# Patient Record
Sex: Female | Born: 1965
Health system: Southern US, Community
[De-identification: ages and names within clinical notes are randomized; demographics above are authoritative.]

## PROBLEM LIST (undated history)

## (undated) DIAGNOSIS — M1711 Unilateral primary osteoarthritis, right knee: Secondary | ICD-10-CM

## (undated) DIAGNOSIS — S83241A Other tear of medial meniscus, current injury, right knee, initial encounter: Secondary | ICD-10-CM

## (undated) DIAGNOSIS — K219 Gastro-esophageal reflux disease without esophagitis: Secondary | ICD-10-CM

## (undated) DIAGNOSIS — C44320 Squamous cell carcinoma of skin of unspecified parts of face: Secondary | ICD-10-CM

## (undated) DIAGNOSIS — D239 Other benign neoplasm of skin, unspecified: Secondary | ICD-10-CM

## (undated) DIAGNOSIS — I10 Essential (primary) hypertension: Secondary | ICD-10-CM

## (undated) DIAGNOSIS — K635 Polyp of colon: Secondary | ICD-10-CM

## (undated) DIAGNOSIS — J45990 Exercise induced bronchospasm: Secondary | ICD-10-CM

## (undated) DIAGNOSIS — M1990 Unspecified osteoarthritis, unspecified site: Secondary | ICD-10-CM

## (undated) HISTORY — DX: Essential (primary) hypertension: I10

## (undated) HISTORY — DX: Gastro-esophageal reflux disease without esophagitis: K21.9

## (undated) HISTORY — DX: Exercise induced bronchospasm: J45.990

## (undated) HISTORY — DX: Other benign neoplasm of skin, unspecified: D23.9

## (undated) HISTORY — DX: Polyp of colon: K63.5

## (undated) HISTORY — DX: Squamous cell carcinoma of skin of unspecified parts of face: C44.320

---

## 2003-09-06 ENCOUNTER — Emergency Department (HOSPITAL_COMMUNITY): Admission: EM | Admit: 2003-09-06 | Discharge: 2003-09-06 | Payer: Self-pay | Admitting: Emergency Medicine

## 2005-01-27 HISTORY — PX: ABDOMINAL HYSTERECTOMY: SHX81

## 2008-02-22 LAB — IFOBT (OCCULT BLOOD): IFOBT: NEGATIVE

## 2009-08-21 LAB — IFOBT (OCCULT BLOOD): IFOBT: NEGATIVE

## 2010-12-02 ENCOUNTER — Ambulatory Visit (INDEPENDENT_AMBULATORY_CARE_PROVIDER_SITE_OTHER): Payer: 59 | Admitting: Family Medicine

## 2010-12-02 ENCOUNTER — Encounter: Payer: Self-pay | Admitting: Family Medicine

## 2010-12-02 VITALS — BP 128/86 | HR 75 | Temp 98.1°F | Ht 63.0 in | Wt 198.0 lb

## 2010-12-02 DIAGNOSIS — M25522 Pain in left elbow: Secondary | ICD-10-CM

## 2010-12-02 DIAGNOSIS — M25529 Pain in unspecified elbow: Secondary | ICD-10-CM

## 2010-12-02 MED ORDER — MELOXICAM 15 MG PO TABS: 15.0000 mg | ORAL_TABLET | Freq: Every day | ORAL | Status: AC

## 2010-12-02 NOTE — Assessment & Plan Note (Signed)
2/2 lateral epicondylitis.  Start physical therapy and transition to HEP.  Counterforce brace felt comfortable so will start this.  Meloxicam daily for pain and inflammation with food.  Offered tramadol for nighttime but she declined.  Icing prn.  Should improve over next 6 weeks.  F/u at that time for reevaluation.  Consider nitro patches, injection if not improving.  See instructions for further.

## 2010-12-02 NOTE — Patient Instructions (Signed)
You have lateral epicondylitis Try to avoid painful activities as much as possible. Ice the area 3-4 times a day for 15 minutes at a time. Meloxicam 15mg  daily with food for pain and inflammation x 7 days then as needed. Counterforce brace or elbow sleeve as directed can help unload area - wear this regularly if it provides you with relief. Home Pronation/supination with 1 pound weight, wrist extension, stretching - do these once a day. Start physical therapy and transition to a home exercise program. Consider injection for short term pain relief if the above is not helping. Nitro patches are a consideration if not improving after 6 weeks. Surgery is a consideration if exercises, nitro, and injection are not providing enough relief. Follow up with me in 6 weeks for a recheck.

## 2010-12-02 NOTE — Progress Notes (Signed)
  Subjective:    Patient ID: Stephanie Castro, female    DOB: 1965-12-02, 45 y.o.   MRN: [id number]  PCP: Dr. Riley Nearing  HPI 45 yo F here for left lateral elbow pain.  Patient denies known injury. States about 3 months ago started to develop lateral elbow pain worse with picking things up. + night pain. Occasional numbness but nothing consistent. Went to [facility] and given 6 day course of prednisone followed by celebrex. States pain resolved with prednisone but started coming back on celebrex. Is left handed. No prior left elbow problems. No neck pain or radiation into shoulder.  Past Medical History  Diagnosis Date  . Hypertension   . GERD (gastroesophageal reflux disease)     No current outpatient prescriptions on file prior to visit.    History reviewed. No pertinent past surgical history.  Allergies  Allergen Reactions  . Penicillins     History   Social History  . Marital Status: Married    Spouse Name: N/A    Number of Children: N/A  . Years of Education: N/A   Occupational History  . Not on file.   Social History Main Topics  . Smoking status: Former Games developer  . Smokeless tobacco: Not on file   Comment: Quit in 2005  . Alcohol Use: Not on file  . Drug Use: Not on file  . Sexually Active: Not on file   Other Topics Concern  . Not on file   Social History Narrative  . No narrative on file    Family History  Problem Relation Age of Onset  . Hyperlipidemia Mother   . Heart attack Father   . Heart attack Sister   . Hyperlipidemia Sister   . Hypertension Sister   . Hyperlipidemia Maternal Aunt   . Hypertension Maternal Uncle   . Diabetes Maternal Uncle   . Hyperlipidemia Maternal Grandmother   . Diabetes Maternal Grandmother   . Diabetes Maternal Grandfather   . Sudden death Neg Hx     BP 128/86  Pulse 75  Temp(Src) 98.1 F (36.7 C) (Oral)  Ht 5\' 3"  (1.6 m)  Wt 198 lb (89.812 kg)  BMI 35.07 kg/m2  Review of  Systems See HPI above.    Objective:   Physical Exam Gen: NAD  L elbow: No gross deformity, swelling, or bruising. TTP lateral epicondyle and in extensor mass reproducing her pain.  No medial epicondyle, humeral, radial head, olecranon, or other TTP about wrist, forearm, elbow, shoulder, or neck. FROM with pain on resisted wrist extension at lateral elbow.  5/5 strength and no pain all other wrist and elbow motions.  No pain on 3rd finger extension. Collateral ligaments intact. NVI distally. Tinels at cubital tunnel negative.     Assessment & Plan:  1. Left elbow pain - 2/2 lateral epicondylitis.  Start physical therapy and transition to HEP.  Counterforce brace felt comfortable so will start this.  Meloxicam daily for pain and inflammation with food.  Offered tramadol for nighttime but she declined.  Icing prn.  Should improve over next 6 weeks.  F/u at that time for reevaluation.  Consider nitro patches, injection if not improving.  See instructions for further.

## 2010-12-03 ENCOUNTER — Ambulatory Visit: Payer: 59 | Attending: Family Medicine | Admitting: Physical Therapy

## 2010-12-03 DIAGNOSIS — M79609 Pain in unspecified limb: Secondary | ICD-10-CM | POA: Insufficient documentation

## 2010-12-03 DIAGNOSIS — IMO0001 Reserved for inherently not codable concepts without codable children: Secondary | ICD-10-CM | POA: Insufficient documentation

## 2010-12-05 ENCOUNTER — Ambulatory Visit: Payer: 59 | Admitting: Physical Therapy

## 2010-12-09 ENCOUNTER — Ambulatory Visit: Payer: 59 | Admitting: Physical Therapy

## 2010-12-11 ENCOUNTER — Ambulatory Visit: Payer: 59 | Admitting: Physical Therapy

## 2010-12-13 ENCOUNTER — Ambulatory Visit: Payer: 59 | Admitting: Physical Therapy

## 2010-12-16 ENCOUNTER — Ambulatory Visit: Payer: 59 | Admitting: Physical Therapy

## 2010-12-18 ENCOUNTER — Encounter: Payer: 59 | Admitting: Physical Therapy

## 2010-12-23 ENCOUNTER — Encounter: Payer: 59 | Admitting: Physical Therapy

## 2010-12-25 ENCOUNTER — Ambulatory Visit: Payer: 59 | Admitting: Physical Therapy

## 2010-12-31 ENCOUNTER — Ambulatory Visit: Payer: 59 | Admitting: Physical Therapy

## 2011-01-03 ENCOUNTER — Ambulatory Visit: Payer: 59 | Admitting: Physical Therapy

## 2011-01-07 ENCOUNTER — Ambulatory Visit: Payer: 59 | Attending: Family Medicine | Admitting: Physical Therapy

## 2011-01-07 DIAGNOSIS — M79609 Pain in unspecified limb: Secondary | ICD-10-CM | POA: Insufficient documentation

## 2011-01-07 DIAGNOSIS — IMO0001 Reserved for inherently not codable concepts without codable children: Secondary | ICD-10-CM | POA: Insufficient documentation

## 2011-01-10 ENCOUNTER — Ambulatory Visit: Payer: 59 | Admitting: Family Medicine

## 2011-04-04 ENCOUNTER — Telehealth: Payer: Self-pay | Admitting: Internal Medicine

## 2011-04-04 ENCOUNTER — Encounter: Payer: Self-pay | Admitting: Internal Medicine

## 2011-04-04 ENCOUNTER — Ambulatory Visit (INDEPENDENT_AMBULATORY_CARE_PROVIDER_SITE_OTHER): Payer: BC Managed Care – PPO | Admitting: Internal Medicine

## 2011-04-04 VITALS — BP 116/80 | HR 66 | Temp 98.4°F | Resp 18 | Ht 62.0 in | Wt 195.0 lb

## 2011-04-04 DIAGNOSIS — I10 Essential (primary) hypertension: Secondary | ICD-10-CM

## 2011-04-04 DIAGNOSIS — E785 Hyperlipidemia, unspecified: Secondary | ICD-10-CM

## 2011-04-04 DIAGNOSIS — K219 Gastro-esophageal reflux disease without esophagitis: Secondary | ICD-10-CM

## 2011-04-04 DIAGNOSIS — Z1239 Encounter for other screening for malignant neoplasm of breast: Secondary | ICD-10-CM

## 2011-04-04 DIAGNOSIS — Z79899 Other long term (current) drug therapy: Secondary | ICD-10-CM

## 2011-04-04 MED ORDER — OMEPRAZOLE 40 MG PO CPDR: 40.0000 mg | DELAYED_RELEASE_CAPSULE | Freq: Every day | ORAL | Status: DC

## 2011-04-04 MED ORDER — TRIAMTERENE-HCTZ 37.5-25 MG PO CAPS: 1.0000 | ORAL_CAPSULE | Freq: Every day | ORAL | Status: DC

## 2011-04-04 NOTE — Patient Instructions (Addendum)
Please schedule fasting lab work prior to next visit Cbc-401.9, chem7-v58.69 and lipid/lft-272.4 Also we will help you schedule your mammogram at [facility] ([place])

## 2011-04-04 NOTE — Telephone Encounter (Signed)
Lab orders entered for July 2013. 

## 2011-04-04 NOTE — Progress Notes (Signed)
  Subjective:    Patient ID: Stephanie Castro, female    DOB: 1965/10/11, 46 y.o.   MRN: [id number]  HPI Pt presents to clinic for evaluation of multiple medical problems. H/o hyperlipidemia of unknown severity and is attempting to avoid statin tx. H/o GERD well controlled with daily ppi without dysphagia. BP reviewed normotensive and is compliant with medication without adverse effect. Last mammogram 2011. No active complaint.  Past Medical History  Diagnosis Date  . Hypertension   . GERD (gastroesophageal reflux disease)    No past surgical history on file.  reports that she has quit smoking. She does not have any smokeless tobacco history on file. Her alcohol and drug histories not on file. family history includes Diabetes in her maternal grandfather, maternal grandmother, and maternal uncle; Heart attack in her father and sister; Hyperlipidemia in her maternal aunt, maternal grandmother, mother, and sister; and Hypertension in her maternal uncle and sister.  There is no history of Sudden death. Allergies  Allergen Reactions  . Penicillins      Review of Systems  Respiratory: Negative for cough and shortness of breath.   Cardiovascular: Negative for chest pain.  Gastrointestinal: Negative for abdominal pain.  All other systems reviewed and are negative.       Objective:   Physical Exam  Physical Exam  Nursing note and vitals reviewed. Constitutional: Appears well-developed and well-nourished. No distress.  HENT:  Head: Normocephalic and atraumatic.  Right Ear: External ear normal.  Left Ear: External ear normal.  Eyes: Conjunctivae are normal. No scleral icterus.  Neck: Neck supple. Carotid bruit is not present.  Cardiovascular: Normal rate, regular rhythm and normal heart sounds.  Exam reveals no gallop and no friction rub.   No murmur heard. Pulmonary/Chest: Effort normal and breath sounds normal. No respiratory distress. He has no wheezes. no rales.  Lymphadenopathy:   He has no cervical adenopathy.  Neurological:Alert.  Skin: Skin is warm and dry. Not diaphoretic.  Psychiatric: Has a normal mood and affect.        Assessment & Plan:

## 2011-04-06 DIAGNOSIS — K219 Gastro-esophageal reflux disease without esophagitis: Secondary | ICD-10-CM | POA: Insufficient documentation

## 2011-04-06 DIAGNOSIS — E785 Hyperlipidemia, unspecified: Secondary | ICD-10-CM

## 2011-04-06 HISTORY — DX: Hyperlipidemia, unspecified: E78.5

## 2011-04-06 NOTE — Assessment & Plan Note (Signed)
asx and well controlled. rf ppi.

## 2011-04-06 NOTE — Assessment & Plan Note (Signed)
Normotensive and stable. Continue current regimen. Monitor bp as outpt and followup in clinic as scheduled. Obtain cbc, chem7 prior to next visit 

## 2011-04-06 NOTE — Assessment & Plan Note (Signed)
Stable. Obtain lipid/lft prior to next visit. 

## 2011-04-29 ENCOUNTER — Encounter: Payer: Self-pay | Admitting: Internal Medicine

## 2011-05-08 ENCOUNTER — Telehealth: Payer: Self-pay | Admitting: Internal Medicine

## 2011-05-08 NOTE — Telephone Encounter (Signed)
Received medical records from [facility]. Martin Majestic   P: [phone] F: [phone]

## 2011-06-13 ENCOUNTER — Ambulatory Visit (INDEPENDENT_AMBULATORY_CARE_PROVIDER_SITE_OTHER): Payer: BC Managed Care – PPO | Admitting: Family Medicine

## 2011-06-13 ENCOUNTER — Encounter: Payer: Self-pay | Admitting: Family Medicine

## 2011-06-13 VITALS — BP 130/88 | HR 63 | Temp 97.7°F | Ht 62.0 in | Wt 193.0 lb

## 2011-06-13 DIAGNOSIS — M25521 Pain in right elbow: Secondary | ICD-10-CM | POA: Insufficient documentation

## 2011-06-13 DIAGNOSIS — M25529 Pain in unspecified elbow: Secondary | ICD-10-CM

## 2011-06-13 NOTE — Patient Instructions (Signed)
You have lateral epicondylitis (tennis elbow). Try to avoid painful activities as much as possible except with rehab. Ice the area 3-4 times a day for 15 minutes at a time. Tylenol and/or meloxicam as needed for pain. Counterforce brace as directed can help unload area - wear this regularly if it provides you with relief. Home Pronation/supination with hammer, wrist extension with 1 pound weight (3 sets of 10 of each), stretching - do these once a day. Consider injection for short term pain relief if the above is not helping - you were given this today. Consider nitro patches, formal PT if not improving as expected. Call me if after 1-2 weeks you're not getting better and you want to try the nitro patches. Otherwise follow-up with me as needed.

## 2011-06-13 NOTE — Progress Notes (Signed)
Subjective:    Patient ID: Stephanie Castro, female    DOB: 11/29/1965, 46 y.o.   MRN: [id number]  PCP: Dr. Riley Nearing  HPI  46 yo F here for right lateral elbow pain.  12/02/10: Patient denies known injury. States about 3 months ago started to develop lateral elbow pain worse with picking things up. + night pain. Occasional numbness but nothing consistent. Went to [facility] and given 6 day course of prednisone followed by celebrex. States pain resolved with prednisone but started coming back on celebrex. Is left handed. No prior left elbow problems. No neck pain or radiation into shoulder.  06/13/11: Patient's left elbow has improved. Reports right elbow has worsened in same location over past 2 weeks. Difficulty bending elbow. Has been using heat then ice, massaging, taking leftover meloxicam. Using a counterforce sleeve. No prior right elbow problems.  Past Medical History  Diagnosis Date  . Hypertension   . GERD (gastroesophageal reflux disease)     Current Outpatient Prescriptions on File Prior to Visit  Medication Sig Dispense Refill  . meclizine (ANTIVERT) 12.5 MG tablet Take 12.5 mg by mouth 3 (three) times daily as needed.        . meloxicam (MOBIC) 15 MG tablet Take 1 tablet (15 mg total) by mouth daily. With food.  30 tablet  1  . omeprazole (PRILOSEC) 40 MG capsule Take 1 capsule (40 mg total) by mouth daily.  30 capsule  11  . triamterene-hydrochlorothiazide (DYAZIDE) 37.5-25 MG per capsule Take 1 each (1 capsule total) by mouth daily.  30 capsule  11    History reviewed. No pertinent past surgical history.  Allergies  Allergen Reactions  . Penicillins     History   Social History  . Marital Status: Married    Spouse Name: N/A    Number of Children: N/A  . Years of Education: N/A   Occupational History  . Not on file.   Social History Main Topics  . Smoking status: Former Games developer  . Smokeless tobacco: Never Used   Comment: Quit  in 2005  . Alcohol Use: Yes  . Drug Use: No  . Sexually Active: Not on file   Other Topics Concern  . Not on file   Social History Narrative  . No narrative on file    Family History  Problem Relation Age of Onset  . Hyperlipidemia Mother   . Heart attack Father   . Heart attack Sister   . Hyperlipidemia Sister   . Hypertension Sister   . Hyperlipidemia Maternal Aunt   . Hypertension Maternal Uncle   . Diabetes Maternal Uncle   . Hyperlipidemia Maternal Grandmother   . Diabetes Maternal Grandmother   . Diabetes Maternal Grandfather   . Sudden death Neg Hx     BP 130/88  Pulse 63  Temp(Src) 97.7 F (36.5 C) (Oral)  Ht 5\' 2"  (1.575 m)  Wt 193 lb (87.544 kg)  BMI 35.30 kg/m2  Review of Systems  See HPI above.    Objective:   Physical Exam  Gen: NAD  R elbow: No gross deformity, swelling, or bruising. TTP lateral epicondyle and in extensor mass reproducing her pain.  No medial epicondyle, humeral, radial head, olecranon, or other TTP about wrist, forearm, elbow, shoulder, or neck. FROM with pain on resisted wrist extension at lateral elbow and 3rd finger extension.  5/5 strength and no pain all other wrist and elbow motions. Collateral ligaments intact. NVI distally.     Assessment &  Plan:  1. Right elbow pain - 2/2 lateral epicondylitis.  Start home exercises, stretches.  Icing, meloxicam and tylenol as needed.  She would like to go ahead with injection today.  If not improving will consider nitro patches.    After informed written consent patient was seated on exam table.  Area overlying area just distal to right lateral epicondyle prepped with alcohol swab then injected with 2:1 marcaine: depomedrol.  Patient tolerated procedure well without immediate complications.

## 2011-06-13 NOTE — Assessment & Plan Note (Signed)
2/2 lateral epicondylitis.  Start home exercises, stretches.  Icing, meloxicam and tylenol as needed.  She would like to go ahead with injection today.  If not improving will consider nitro patches.    After informed written consent patient was seated on exam table.  Area overlying area just distal to right lateral epicondyle prepped with alcohol swab then injected with 2:1 marcaine: depomedrol.  Patient tolerated procedure well without immediate complications.

## 2011-08-07 ENCOUNTER — Ambulatory Visit (INDEPENDENT_AMBULATORY_CARE_PROVIDER_SITE_OTHER): Payer: BC Managed Care – PPO | Admitting: Family Medicine

## 2011-08-07 VITALS — BP 124/86 | HR 71 | Temp 98.0°F | Ht 62.0 in | Wt 185.0 lb

## 2011-08-07 DIAGNOSIS — M25529 Pain in unspecified elbow: Secondary | ICD-10-CM

## 2011-08-07 DIAGNOSIS — M25521 Pain in right elbow: Secondary | ICD-10-CM

## 2011-08-07 NOTE — Patient Instructions (Addendum)
Continue with your home exercises after a few days (wrist extension, stretching, hammer exercise). Ice area 15 minutes at a time 3-4 times a day for 15 minutes at a time. Counterforce brace as directed can help unload area - wear this regularly if it provides you with relief. I would not give you a third injection for this. If you're not improving after a few weeks, call me and we will do nitro patches and physical therapy.

## 2011-08-08 ENCOUNTER — Encounter: Payer: Self-pay | Admitting: Family Medicine

## 2011-08-08 ENCOUNTER — Ambulatory Visit: Payer: BC Managed Care – PPO | Admitting: Internal Medicine

## 2011-08-08 NOTE — Progress Notes (Signed)
Subjective:    Patient ID: Stephanie Castro, female    DOB: 1965/11/20, 46 y.o.   MRN: [id number]  PCP: Dr. Riley Nearing  HPI  46 yo F here for right lateral elbow pain.  12/02/10: Patient denies known injury. States about 3 months ago started to develop lateral elbow pain worse with picking things up. + night pain. Occasional numbness but nothing consistent. Went to [facility] and given 6 day course of prednisone followed by celebrex. States pain resolved with prednisone but started coming back on celebrex. Is left handed. No prior left elbow problems. No neck pain or radiation into shoulder.  06/13/11: Patient's left elbow has improved. Reports right elbow has worsened in same location over past 2 weeks. Difficulty bending elbow. Has been using heat then ice, massaging, taking leftover meloxicam. Using a counterforce sleeve. No prior right elbow problems.  7/11: Patient states injection helped take pain away for a month but has recurred over past 2-3 weeks. Has a burning pain outside right elbow - left elbow no longer in pain. Using brace and doing home exercises. Ached with icing - doing this a lot though. No longer taking mobic or an nsaid.  Past Medical History  Diagnosis Date  . Hypertension   . GERD (gastroesophageal reflux disease)     Current Outpatient Prescriptions on File Prior to Visit  Medication Sig Dispense Refill  . meclizine (ANTIVERT) 12.5 MG tablet Take 12.5 mg by mouth 3 (three) times daily as needed.        . meloxicam (MOBIC) 15 MG tablet Take 1 tablet (15 mg total) by mouth daily. With food.  30 tablet  1  . omeprazole (PRILOSEC) 40 MG capsule Take 1 capsule (40 mg total) by mouth daily.  30 capsule  11  . triamterene-hydrochlorothiazide (DYAZIDE) 37.5-25 MG per capsule Take 1 each (1 capsule total) by mouth daily.  30 capsule  11    History reviewed. No pertinent past surgical history.  Allergies  Allergen Reactions  .  Penicillins     History   Social History  . Marital Status: Married    Spouse Name: N/A    Number of Children: N/A  . Years of Education: N/A   Occupational History  . Not on file.   Social History Main Topics  . Smoking status: Former Games developer  . Smokeless tobacco: Never Used   Comment: Quit in 2005  . Alcohol Use: Yes  . Drug Use: No  . Sexually Active: Not on file   Other Topics Concern  . Not on file   Social History Narrative  . No narrative on file    Family History  Problem Relation Age of Onset  . Hyperlipidemia Mother   . Heart attack Father   . Heart attack Sister   . Hyperlipidemia Sister   . Hypertension Sister   . Hyperlipidemia Maternal Aunt   . Hypertension Maternal Uncle   . Diabetes Maternal Uncle   . Hyperlipidemia Maternal Grandmother   . Diabetes Maternal Grandmother   . Diabetes Maternal Grandfather   . Sudden death Neg Hx     BP 124/86  Pulse 71  Temp 98 F (36.7 C) (Oral)  Ht 5\' 2"  (1.575 m)  Wt 185 lb (83.915 kg)  BMI 33.84 kg/m2  Review of Systems  See HPI above.    Objective:   Physical Exam  Gen: NAD  R elbow: No gross deformity, swelling, or bruising. TTP lateral epicondyle and in extensor mass reproducing her  pain.  No medial epicondyle, humeral, radial head, olecranon, or other TTP about wrist, forearm, elbow, shoulder, or neck. FROM with pain on resisted wrist extension at lateral elbow and 3rd finger extension.  5/5 strength and no pain all other wrist and elbow motions. Collateral ligaments intact. NVI distally.     Assessment & Plan:  1. Right elbow pain - 2/2 lateral epicondylitis.  Exam similar to last office visit.  Reviewed options: PT, nitro, and/or injection.  She would like to try injection again - would not do a third time however.  If this recurs advised to call at which point would order PT and try nitro patches.  Other treatment as discussed previously.   After informed written consent patient was  seated on exam table.  Area overlying area just distal to right lateral epicondyle prepped with alcohol swab then injected with 2:1 marcaine: depomedrol.  Patient tolerated procedure well without immediate complications.

## 2011-08-08 NOTE — Assessment & Plan Note (Signed)
2/2 lateral epicondylitis.  Exam similar to last office visit.  Reviewed options: PT, nitro, and/or injection.  She would like to try injection again - would not do a third time however.  If this recurs advised to call at which point would order PT and try nitro patches.  Other treatment as discussed previously.   After informed written consent patient was seated on exam table.  Area overlying area just distal to right lateral epicondyle prepped with alcohol swab then injected with 2:1 marcaine: depomedrol.  Patient tolerated procedure well without immediate complications.

## 2011-09-23 ENCOUNTER — Ambulatory Visit (INDEPENDENT_AMBULATORY_CARE_PROVIDER_SITE_OTHER): Payer: BC Managed Care – PPO | Admitting: Internal Medicine

## 2011-09-23 ENCOUNTER — Encounter: Payer: Self-pay | Admitting: Internal Medicine

## 2011-09-23 VITALS — BP 126/84 | HR 104 | Temp 98.8°F | Resp 18 | Wt 180.0 lb

## 2011-09-23 DIAGNOSIS — I10 Essential (primary) hypertension: Secondary | ICD-10-CM

## 2011-09-23 DIAGNOSIS — J40 Bronchitis, not specified as acute or chronic: Secondary | ICD-10-CM

## 2011-09-23 MED ORDER — DOXYCYCLINE HYCLATE 100 MG PO TABS: 100.0000 mg | ORAL_TABLET | Freq: Two times a day (BID) | ORAL | Status: AC

## 2011-09-23 NOTE — Assessment & Plan Note (Signed)
Begin abx tx. Followup if no improvement or worsening.  

## 2011-09-23 NOTE — Progress Notes (Signed)
  Subjective:    Patient ID: Stephanie Castro, female    DOB: 1965-03-03, 46 y.o.   MRN: [id number]  HPI Pt presents to clinic for evaluation of cough. Notes one week h/o cough productive for green sputum, nasal congestion/drainage and chest congestion. Denies hemoptysis or wheezing. Unsure about fever. Reviewed bp under average control despite illness.   Past Medical History  Diagnosis Date  . Hypertension   . GERD (gastroesophageal reflux disease)    No past surgical history on file.  reports that she has quit smoking. She has never used smokeless tobacco. She reports that she drinks alcohol. She reports that she does not use illicit drugs. family history includes Diabetes in her maternal grandfather, maternal grandmother, and maternal uncle; Heart attack in her father and sister; Hyperlipidemia in her maternal aunt, maternal grandmother, mother, and sister; and Hypertension in her maternal uncle and sister.  There is no history of Sudden death. Allergies  Allergen Reactions  . Penicillins      Review of Systems see hpi     Objective:   Physical Exam  Nursing note and vitals reviewed. Constitutional: She appears well-developed and well-nourished. No distress.  HENT:  Head: Normocephalic and atraumatic.  Right Ear: External ear normal.  Left Ear: External ear normal.  Nose: Nose normal.  Mouth/Throat: Oropharynx is clear and moist. No oropharyngeal exudate.  Eyes: Conjunctivae are normal. No scleral icterus.  Neck: Neck supple.  Pulmonary/Chest: Effort normal and breath sounds normal. No respiratory distress. She has no wheezes. She has no rales.  Lymphadenopathy:    She has no cervical adenopathy.  Neurological: She is alert.  Skin: Skin is warm and dry. She is not diaphoretic.  Psychiatric: She has a normal mood and affect.          Assessment & Plan:

## 2011-09-23 NOTE — Assessment & Plan Note (Signed)
Normotensive and stable. Continue current regimen. Monitor bp as outpt and followup in clinic as scheduled. Obtain cbc, chem7 and lipid

## 2011-12-05 ENCOUNTER — Ambulatory Visit (INDEPENDENT_AMBULATORY_CARE_PROVIDER_SITE_OTHER): Payer: BC Managed Care – PPO | Admitting: Family Medicine

## 2011-12-05 VITALS — BP 128/86 | Ht 61.5 in | Wt 180.0 lb

## 2011-12-05 DIAGNOSIS — M25521 Pain in right elbow: Secondary | ICD-10-CM

## 2011-12-05 DIAGNOSIS — M25529 Pain in unspecified elbow: Secondary | ICD-10-CM

## 2011-12-05 LAB — CBC
MCHC: 33.9 g/dL (ref 30.0–36.0)
RBC: 4.34 MIL/uL (ref 3.87–5.11)

## 2011-12-05 MED ORDER — NITROGLYCERIN 0.2 MG/HR TD PT24: MEDICATED_PATCH | TRANSDERMAL | Status: DC

## 2011-12-05 NOTE — Telephone Encounter (Signed)
Lab orders released/SLS 

## 2011-12-05 NOTE — Addendum Note (Signed)
Addended by: Regis Bill on: 12/05/2011 11:30 AM   Modules accepted: Orders

## 2011-12-06 LAB — BASIC METABOLIC PANEL
BUN: 9 mg/dL (ref 6–23)
Calcium: 8.9 mg/dL (ref 8.4–10.5)
Creat: 0.72 mg/dL (ref 0.50–1.10)
Potassium: 3.7 mEq/L (ref 3.5–5.3)
Sodium: 140 mEq/L (ref 135–145)

## 2011-12-06 LAB — HEPATIC FUNCTION PANEL
AST: 35 U/L (ref 0–37)
Alkaline Phosphatase: 67 U/L (ref 39–117)
Total Bilirubin: 0.6 mg/dL (ref 0.3–1.2)
Total Protein: 6.4 g/dL (ref 6.0–8.3)

## 2011-12-10 ENCOUNTER — Encounter: Payer: Self-pay | Admitting: Family Medicine

## 2011-12-10 NOTE — Progress Notes (Signed)
Subjective:    Patient ID: Stephanie Castro, female    DOB: 02-04-1965, 46 y.o.   MRN: [id number]  PCP: Dr. Riley Nearing  HPI  46 yo F here for right lateral elbow pain.  12/02/10: Patient denies known injury. States about 3 months ago started to develop lateral elbow pain worse with picking things up. + night pain. Occasional numbness but nothing consistent. Went to [facility] and given 6 day course of prednisone followed by celebrex. States pain resolved with prednisone but started coming back on celebrex. Is left handed. No prior left elbow problems. No neck pain or radiation into shoulder.  06/13/11: Patient's left elbow has improved. Reports right elbow has worsened in same location over past 2 weeks. Difficulty bending elbow. Has been using heat then ice, massaging, taking leftover meloxicam. Using a counterforce sleeve. No prior right elbow problems.  7/11: Patient states injection helped take pain away for a month but has recurred over past 2-3 weeks. Has a burning pain outside right elbow - left elbow no longer in pain. Using brace and doing home exercises. Ached with icing - doing this a lot though. No longer taking mobic or an nsaid.  11/8: Patient again had about 1 month relief with repeat injection. Continues with lateral elbow burning pain, stiffness. Difficulty gripping items. Using heating pad then cold. Difficult to get dressed. Doing home exercises regularly.  Past Medical History  Diagnosis Date  . Hypertension   . GERD (gastroesophageal reflux disease)     Current Outpatient Prescriptions on File Prior to Visit  Medication Sig Dispense Refill  . meclizine (ANTIVERT) 12.5 MG tablet Take 12.5 mg by mouth 3 (three) times daily as needed.        . nitroGLYCERIN (NITRODUR - DOSED IN MG/24 HR) 0.2 mg/hr Apply 1/4th of a patch over affected elbow and change daily  30 patch  1  . omeprazole (PRILOSEC) 40 MG capsule Take 1 capsule (40 mg  total) by mouth daily.  30 capsule  11  . triamterene-hydrochlorothiazide (DYAZIDE) 37.5-25 MG per capsule Take 1 each (1 capsule total) by mouth daily.  30 capsule  11    History reviewed. No pertinent past surgical history.  Allergies  Allergen Reactions  . Penicillins     History   Social History  . Marital Status: Married    Spouse Name: N/A    Number of Children: N/A  . Years of Education: N/A   Occupational History  . Not on file.   Social History Main Topics  . Smoking status: Former Games developer  . Smokeless tobacco: Never Used     Comment: Quit in 2005  . Alcohol Use: Yes  . Drug Use: No  . Sexually Active: Not on file   Other Topics Concern  . Not on file   Social History Narrative  . No narrative on file    Family History  Problem Relation Age of Onset  . Hyperlipidemia Mother   . Heart attack Father   . Heart attack Sister   . Hyperlipidemia Sister   . Hypertension Sister   . Hyperlipidemia Maternal Aunt   . Hypertension Maternal Uncle   . Diabetes Maternal Uncle   . Hyperlipidemia Maternal Grandmother   . Diabetes Maternal Grandmother   . Diabetes Maternal Grandfather   . Sudden death Neg Hx     BP 128/86  Ht 5' 1.5" (1.562 m)  Wt 180 lb (81.647 kg)  BMI 33.46 kg/m2  Review of Systems  See  HPI above.    Objective:   Physical Exam  Gen: NAD  R elbow: No gross deformity, swelling, or bruising. TTP lateral epicondyle and in extensor mass reproducing her pain.  No medial epicondyle, humeral, radial head, olecranon, or other TTP about wrist, forearm, elbow, shoulder, or neck. FROM with pain on resisted wrist extension at lateral elbow and 3rd finger extension.   5/5 strength and no pain all other wrist and elbow motions. Collateral ligaments intact. NVI distally.     Assessment & Plan:  1. Right elbow pain - 2/2 lateral epicondylitis.  Not improved after 2 injections and home exercise program.  Will start formal PT and nitro patches.  If  still not improving would consider surgical referral.  F/u in 6 weeks.

## 2011-12-10 NOTE — Assessment & Plan Note (Signed)
2/2 lateral epicondylitis.  Not improved after 2 injections and home exercise program.  Will start formal PT and nitro patches.  If still not improving would consider surgical referral.  F/u in 6 weeks.

## 2012-01-02 ENCOUNTER — Ambulatory Visit: Payer: 59 | Attending: Family Medicine | Admitting: Rehabilitation

## 2012-01-02 DIAGNOSIS — M25539 Pain in unspecified wrist: Secondary | ICD-10-CM | POA: Insufficient documentation

## 2012-01-02 DIAGNOSIS — IMO0001 Reserved for inherently not codable concepts without codable children: Secondary | ICD-10-CM | POA: Insufficient documentation

## 2012-01-06 ENCOUNTER — Ambulatory Visit: Payer: 59 | Admitting: Rehabilitation

## 2012-01-13 ENCOUNTER — Ambulatory Visit: Payer: 59

## 2012-01-26 ENCOUNTER — Ambulatory Visit: Payer: 59 | Admitting: Rehabilitation

## 2012-03-16 ENCOUNTER — Encounter: Payer: Self-pay | Admitting: Family Medicine

## 2012-03-16 ENCOUNTER — Ambulatory Visit (INDEPENDENT_AMBULATORY_CARE_PROVIDER_SITE_OTHER): Payer: Self-pay | Admitting: Family Medicine

## 2012-03-16 VITALS — BP 130/80 | HR 76 | Temp 97.7°F | Ht 62.0 in | Wt 190.2 lb

## 2012-03-16 DIAGNOSIS — N390 Urinary tract infection, site not specified: Secondary | ICD-10-CM

## 2012-03-16 DIAGNOSIS — R3915 Urgency of urination: Secondary | ICD-10-CM

## 2012-03-16 LAB — POCT URINALYSIS DIPSTICK
Bilirubin, UA: NEGATIVE
Glucose, UA: NEGATIVE
Ketones, UA: NEGATIVE
pH, UA: 7

## 2012-03-16 MED ORDER — NITROFURANTOIN MONOHYD MACRO 100 MG PO CAPS: 100.0000 mg | ORAL_CAPSULE | Freq: Two times a day (BID) | ORAL | Status: DC

## 2012-03-16 NOTE — Progress Notes (Signed)
  Subjective:    Patient ID: Stephanie Castro, female    DOB: 08/28/1965, 47 y.o.   MRN: [id number]  HPI UTI- + suprapubic pressure started 2 days ago w/ incomplete emptying, burning w/ urination.  Scant blood w/ wiping yesterday, cloudy urine.  No fevers.  No back pain.  Hx of similar but 'years ago'.  Allergic to PCN.   Review of Systems For ROS see HPI     Objective:   Physical Exam  Vitals reviewed. Constitutional: She appears well-developed and well-nourished. No distress.  Abdominal: Soft. She exhibits no distension. There is no tenderness (no suprapubic or CVA tenderness).          Assessment & Plan:

## 2012-03-16 NOTE — Patient Instructions (Addendum)
This appears to be a UTI Start the Macrobid twice daily- take w/ food Drink plenty of fluids AZO if needed for bladder pain or spasm Call with any questions or concerns Hang in there!!!

## 2012-03-16 NOTE — Assessment & Plan Note (Signed)
New.  Pt's sxs and UA consistent w/ infxn.  Start abx.  Reviewed supportive care and red flags that should prompt return.  Pt expressed understanding and is in agreement w/ plan.  

## 2012-04-08 ENCOUNTER — Ambulatory Visit (INDEPENDENT_AMBULATORY_CARE_PROVIDER_SITE_OTHER): Payer: 59 | Admitting: Family

## 2012-04-08 ENCOUNTER — Encounter (HOSPITAL_BASED_OUTPATIENT_CLINIC_OR_DEPARTMENT_OTHER): Payer: Self-pay

## 2012-04-08 ENCOUNTER — Encounter: Payer: Self-pay | Admitting: Family

## 2012-04-08 ENCOUNTER — Ambulatory Visit (HOSPITAL_BASED_OUTPATIENT_CLINIC_OR_DEPARTMENT_OTHER): Admission: RE | Admit: 2012-04-08 | Payer: 59 | Source: Ambulatory Visit

## 2012-04-08 ENCOUNTER — Ambulatory Visit (HOSPITAL_BASED_OUTPATIENT_CLINIC_OR_DEPARTMENT_OTHER)
Admission: RE | Admit: 2012-04-08 | Discharge: 2012-04-08 | Disposition: A | Discharge status: Home / Self Care | Payer: 59 | Source: Ambulatory Visit | Attending: Family | Admitting: Family

## 2012-04-08 ENCOUNTER — Telehealth: Payer: Self-pay | Admitting: Family

## 2012-04-08 VITALS — BP 110/80 | HR 66 | Temp 98.0°F | Resp 16 | Wt 186.0 lb

## 2012-04-08 DIAGNOSIS — R0789 Other chest pain: Secondary | ICD-10-CM | POA: Insufficient documentation

## 2012-04-08 DIAGNOSIS — R1032 Left lower quadrant pain: Secondary | ICD-10-CM

## 2012-04-08 DIAGNOSIS — R079 Chest pain, unspecified: Secondary | ICD-10-CM

## 2012-04-08 DIAGNOSIS — K5900 Constipation, unspecified: Secondary | ICD-10-CM | POA: Insufficient documentation

## 2012-04-08 DIAGNOSIS — R109 Unspecified abdominal pain: Secondary | ICD-10-CM | POA: Insufficient documentation

## 2012-04-08 MED ORDER — IOHEXOL 300 MG/ML  SOLN
100.0000 mL | Freq: Once | INTRAMUSCULAR | Status: AC | PRN
  Administered 2012-04-08: 100 mL via INTRAVENOUS

## 2012-04-08 NOTE — Telephone Encounter (Signed)
The following was reviewed with the patient: CT results: Moderate stool burden throughout the colon.  Recommend Magnesium citrate- drink 1/2 bottle.  If no BM in 6 hours, drink remainder of bottle.   Add Amitiza twice daily for probable IBS #16 tabs left at front desk for pick up.Drink 6-8 glasses of water a day and eat lots of fresh fruits/veggies.  Reviewed EKG- notes nonspecific T wave abnormality. Given hx of chest pain will refer for exercise stress test.  Pt is going to try to get old EKG from [facility] to compare.

## 2012-04-08 NOTE — Assessment & Plan Note (Addendum)
Pt is very tender today.  I am concerned about diverticulitis.  Will obtain CT abd/pelvis today to further evaluate.  Addendum:CT noted mod stool burden. See phone note. Trial of mag citrate x 1 and add amitiza.  #8 tabs of 8mg  left at front desk for pt to pick up.

## 2012-04-08 NOTE — Patient Instructions (Addendum)
Please complete your CT scan on the first floor today. We will contact you with your results.

## 2012-04-08 NOTE — Progress Notes (Signed)
Subjective:    Patient ID: Stephanie Castro, female    DOB: [birth year], 47 y.o.   MRN: [id number]  HPI  [name] is a 47 yr old female who presents today with chief complaint of constipation. She reports that typically she has 1-2 BM's a day.  She reports occasional loose BM's for many years. She reports that starting around January her BM's started to become every 2-3 days.  Was so constipated that she felt like she might pass out during BM.  Last Thursday she had 7 days without BM.  She reports associated chest heaviness and shortness of breath. She used two suppositories- no BM. Then fleets enema- medium sized BM, hard.  This was followed by several smaller BM.  Had small BM last night.  She reports associated belching. She denies current abdominal pain.  She reports that she had a small amount of blood on the stool last BM which she attributed to hemorrhoids.  She denies nausea.   Review of Systems    see HPI  Past Medical History  Diagnosis Date  . Hypertension   . GERD (gastroesophageal reflux disease)     History   Social History  . Marital Status: Married    Spouse Name: N/A    Number of Children: N/A  . Years of Education: N/A   Occupational History  . Not on file.   Social History Main Topics  . Smoking status: Former Games developer  . Smokeless tobacco: Never Used     Comment: Quit in 2005  . Alcohol Use: Yes  . Drug Use: No  . Sexually Active: Not on file   Other Topics Concern  . Not on file   Social History Narrative  . No narrative on file    No past surgical history on file.  Family History  Problem Relation Age of Onset  . Hyperlipidemia Mother   . Heart attack Father   . Heart attack Sister   . Hyperlipidemia Sister   . Hypertension Sister   . Hyperlipidemia Maternal Aunt   . Hypertension Maternal Uncle   . Diabetes Maternal Uncle   . Hyperlipidemia Maternal Grandmother   . Diabetes Maternal Grandmother   . Diabetes Maternal Grandfather   .  Sudden death Neg Hx     Allergies  Allergen Reactions  . Penicillins     Current Outpatient Prescriptions on File Prior to Visit  Medication Sig Dispense Refill  . meclizine (ANTIVERT) 12.5 MG tablet Take 12.5 mg by mouth 3 (three) times daily as needed.        . nitrofurantoin, macrocrystal-monohydrate, (MACROBID) 100 MG capsule Take 1 capsule (100 mg total) by mouth 2 (two) times daily.  14 capsule  0  . nitroGLYCERIN (NITRODUR - DOSED IN MG/24 HR) 0.2 mg/hr Apply 1/4th of a patch over affected elbow and change daily  30 patch  1  . omeprazole (PRILOSEC) 40 MG capsule Take 1 capsule (40 mg total) by mouth daily.  30 capsule  11  . triamterene-hydrochlorothiazide (DYAZIDE) 37.5-25 MG per capsule Take 1 each (1 capsule total) by mouth daily.  30 capsule  11   No current facility-administered medications on file prior to visit.    BP 110/80  Pulse 66  Temp(Src) 98 F (36.7 C) (Oral)  Resp 16  Wt 186 lb (84.369 kg)  BMI 34.01 kg/m2  SpO2 99%    Objective:   Physical Exam  Constitutional: She appears well-developed and well-nourished. No distress.  Cardiovascular: Normal rate  and regular rhythm.   No murmur heard. Pulmonary/Chest: Effort normal and breath sounds normal. No respiratory distress. She has no wheezes. She has no rales. She exhibits no tenderness.  Abdominal: Soft. Bowel sounds are decreased. There is tenderness in the right upper quadrant and left lower quadrant. There is no rigidity, no rebound and no guarding.  Musculoskeletal: She exhibits no edema.          Assessment & Plan:

## 2012-04-08 NOTE — Assessment & Plan Note (Addendum)
Likely related to GI bloating. Old records obtained from [facility]. Reviewed old ekg and compared to todays ekg. appeRs unchanged. She had neg stress echo 2012. No further work up at this time.

## 2012-04-09 ENCOUNTER — Encounter: Payer: Self-pay | Admitting: Family

## 2012-04-09 ENCOUNTER — Telehealth: Payer: Self-pay | Admitting: Internal Medicine

## 2012-04-09 ENCOUNTER — Telehealth: Payer: Self-pay | Admitting: *Deleted

## 2012-04-09 NOTE — Telephone Encounter (Signed)
Immunizations updated.

## 2012-04-09 NOTE — Telephone Encounter (Signed)
LMOM with contact name and number RE: records and further provider recommendations on testing; advised to return call if any further questions and/or concerns/SLS

## 2012-04-09 NOTE — Telephone Encounter (Signed)
Reviewed EKG from 08/20/09 and normal stress echo/ exercise stress test from 2012.  EKG appears unchanged.  I do not feel she needs any further cardiac work up at this time unless her symptom or chest pain/pressure worsens or does not improve.

## 2012-04-09 NOTE — Telephone Encounter (Signed)
Patient brought her records by.  I have copied them and am putting them on your desk.  When you have reviewed the records, please advise the patient if you still think she needs further testing

## 2012-04-09 NOTE — Telephone Encounter (Signed)
Message copied by Kathi Simpers on Fri Apr 09, 2012  4:40 PM ------      Message from: O'SULLIVAN, MELISSA      Created: Fri Apr 09, 2012 12:45 PM       pls add tdap 02/15/08- historical thanks ------

## 2012-04-12 MED ORDER — LUBIPROSTONE 8 MCG PO CAPS: 8.0000 ug | ORAL_CAPSULE | Freq: Two times a day (BID) | ORAL | Status: DC

## 2012-04-13 ENCOUNTER — Telehealth: Payer: Self-pay

## 2012-04-13 ENCOUNTER — Other Ambulatory Visit: Payer: Self-pay | Admitting: Internal Medicine

## 2012-04-13 NOTE — Telephone Encounter (Signed)
Amitiza is approved for coverage until 10-13-12

## 2012-05-18 ENCOUNTER — Telehealth: Payer: Self-pay | Admitting: Internal Medicine

## 2012-05-18 NOTE — Telephone Encounter (Signed)
Patient states that she was seen last month for a UTI and now she is having the same symptoms again. She would like to know if something could be called into the pharmacy?

## 2012-05-18 NOTE — Telephone Encounter (Signed)
Please advise 

## 2012-05-19 ENCOUNTER — Encounter: Payer: Self-pay | Admitting: Nurse Practitioner

## 2012-05-19 ENCOUNTER — Ambulatory Visit (INDEPENDENT_AMBULATORY_CARE_PROVIDER_SITE_OTHER): Payer: 59 | Admitting: Nurse Practitioner

## 2012-05-19 ENCOUNTER — Telehealth: Payer: Self-pay | Admitting: Nurse Practitioner

## 2012-05-19 VITALS — BP 137/90 | HR 107 | Temp 97.9°F | Ht 62.0 in | Wt 185.2 lb

## 2012-05-19 DIAGNOSIS — R8290 Unspecified abnormal findings in urine: Secondary | ICD-10-CM

## 2012-05-19 DIAGNOSIS — R82998 Other abnormal findings in urine: Secondary | ICD-10-CM

## 2012-05-19 DIAGNOSIS — R300 Dysuria: Secondary | ICD-10-CM

## 2012-05-19 DIAGNOSIS — N390 Urinary tract infection, site not specified: Secondary | ICD-10-CM

## 2012-05-19 DIAGNOSIS — R809 Proteinuria, unspecified: Secondary | ICD-10-CM

## 2012-05-19 LAB — COMPREHENSIVE METABOLIC PANEL
ALT: 18 U/L (ref 0–35)
AST: 24 U/L (ref 0–37)
Alkaline Phosphatase: 72 U/L (ref 39–117)
Sodium: 137 mEq/L (ref 135–145)
Total Bilirubin: 0.8 mg/dL (ref 0.3–1.2)
Total Protein: 6.4 g/dL (ref 6.0–8.3)

## 2012-05-19 LAB — POCT URINALYSIS DIPSTICK
Nitrite, UA: POSITIVE
Protein, UA: >=300
Urobilinogen, UA: 4

## 2012-05-19 LAB — HEMOGLOBIN A1C: Hgb A1c MFr Bld: 5.2 % (ref 4.6–6.5)

## 2012-05-19 MED ORDER — NITROFURANTOIN MONOHYD MACRO 100 MG PO CAPS: 100.0000 mg | ORAL_CAPSULE | Freq: Two times a day (BID) | ORAL | Status: DC

## 2012-05-19 MED ORDER — PHENAZOPYRIDINE HCL 200 MG PO TABS: 200.0000 mg | ORAL_TABLET | Freq: Three times a day (TID) | ORAL | Status: DC | PRN

## 2012-05-19 NOTE — Addendum Note (Signed)
Addended by: Alben Spittle, Konrad Felix COX on: 05/19/2012 05:34 PM   Modules accepted: Orders

## 2012-05-19 NOTE — Telephone Encounter (Signed)
Left a detailed message on patients vm 

## 2012-05-19 NOTE — Telephone Encounter (Signed)
She will need to be see as we need urine specimen please.

## 2012-05-19 NOTE — Patient Instructions (Signed)
I am treating you for a urinary tract infection today. You will take an antibiotic as prescribed. Increase water intake to at least 6-8 glasses 8 oz. Daily. I am concerned about the protein and sugar in your urine today. The blood work you completed will give me more information. We will call you if there are abnormal results that we need to discuss, otherwise, no news is good news. Please follow up with Dr. Abner Greenspan in 1 month for a recheck of urine. Feel better!

## 2012-05-19 NOTE — Telephone Encounter (Signed)
Spoke with pt. Advised all labs normal. Likely that UA results were skewed due to AZO use. Pt will return in 1 month for urine recheck.

## 2012-05-19 NOTE — Progress Notes (Signed)
  Subjective:    Patient ID: Stephanie Castro, female    DOB: 27-Apr-1965, 47 y.o.   MRN: [id number]  HPI Comments: Patient complains of burning with urination, incomplete bladder emptying and urgency. She has had symptoms for 2 days. Patient also complains of no other symptoms, but is concerned that this is the 3rd UTI she has had in 3 months.. Patient denies back pain, cough, fever, headache and sorethroat. Patient does have a history of recurrent UTI. Patient does not have a history of pyelonephritis.   Urinary Tract Infection  This is a recurrent problem. The current episode started in the past 7 days. The problem occurs every urination. The problem has been unchanged. The quality of the pain is described as burning. The pain is mild. There has been no fever. She is sexually active. There is no history of pyelonephritis. Associated symptoms include frequency, hematuria (last infection, 1 mo. ago, noticed blood on tissue (NO longer menstruating)) and urgency. Pertinent negatives include no flank pain, nausea or vomiting. Treatments tried: AZO OTC. The treatment provided no relief. Her past medical history is significant for recurrent UTIs.      Review of Systems  Constitutional: Negative.   Respiratory: Negative for cough and shortness of breath (no SOB with this episode, but pt mentions that last month she experienced sob with infection).   Cardiovascular: Negative.   Gastrointestinal: Negative for nausea, vomiting and diarrhea.  Genitourinary: Positive for dysuria, urgency, frequency and hematuria (last infection, 1 mo. ago, noticed blood on tissue (NO longer menstruating)). Negative for flank pain, vaginal bleeding, vaginal discharge and pelvic pain.  Musculoskeletal: Negative for myalgias, back pain and arthralgias.  Skin: Negative for rash.  Neurological: Negative for headaches.  Hematological: Negative for adenopathy. Does not bruise/bleed easily.       Objective:   Physical Exam   Vitals reviewed. Constitutional: She is oriented to person, place, and time. She appears well-developed and well-nourished. No distress.  Pt is pleasant and conversive, but expresses displeasure over not being able to see same provider, but is glad that she has appt. today  HENT:  Head: Normocephalic and atraumatic.  Nose: Nose normal.  Eyes: Conjunctivae are normal.  Cardiovascular: Normal rate, regular rhythm and normal heart sounds.   No murmur heard. Pulmonary/Chest: Effort normal and breath sounds normal. No respiratory distress. She has no wheezes.  Abdominal: Soft. She exhibits no distension and no mass. There is tenderness (epigastric). There is no rebound and no guarding.  Bowel sounds hypoactive  Genitourinary:  POCT ua: urine is discolored due to OTC AZO use, but sediment is noted, otherwise is essentially clear, frothy when shaken indicating protein  Musculoskeletal: She exhibits no tenderness.  Neurological: She is alert and oriented to person, place, and time.  Skin: Skin is warm and dry.  Psychiatric: She has a normal mood and affect. Her behavior is normal. Thought content normal.          Assessment & Plan:  1) UTI: Macrodantin and pyridium for urgency & feeling like unable to empty bladder. Concerned that this is 3rd infection in 3 months. Cmet & HgbA1c today. 2)glucosuria: HgbA1C today 3)proteinuria: Cmet today 4)Hematuria: may be r/t bladder infection, but recommend return in 4 weeks for recheck of urine

## 2012-05-19 NOTE — Progress Notes (Deleted)
gen utiUrinary Tract Infection Patient complains of {uti sx:16137}. She has had symptoms for {1-10:13787} {time; units:18646}. Patient also complains of {symptoms; WUJ:81191}. Patient denies {symptoms YNW:29562}. Patient {does/does not:19097::"does not"} have a history of recurrent UTI. Patient {does/does not:19097::"does not"} have a history of pyelonephritis.

## 2012-05-20 NOTE — Addendum Note (Signed)
Addended by: Baldemar Lenis R on: 05/20/2012 09:09 AM   Modules accepted: Orders

## 2012-05-23 LAB — URINE CULTURE

## 2012-06-14 ENCOUNTER — Encounter: Payer: Self-pay | Admitting: Nurse Practitioner

## 2012-06-14 ENCOUNTER — Ambulatory Visit (INDEPENDENT_AMBULATORY_CARE_PROVIDER_SITE_OTHER): Payer: 59 | Admitting: Nurse Practitioner

## 2012-06-14 VITALS — BP 120/70 | HR 73 | Temp 97.9°F | Ht 62.0 in | Wt 183.5 lb

## 2012-06-14 DIAGNOSIS — N390 Urinary tract infection, site not specified: Secondary | ICD-10-CM

## 2012-06-14 DIAGNOSIS — I10 Essential (primary) hypertension: Secondary | ICD-10-CM

## 2012-06-14 DIAGNOSIS — F411 Generalized anxiety disorder: Secondary | ICD-10-CM

## 2012-06-14 LAB — POCT URINALYSIS DIPSTICK
Bilirubin, UA: NEGATIVE
Glucose, UA: NEGATIVE
Ketones, UA: NEGATIVE
Spec Grav, UA: 1.02

## 2012-06-14 MED ORDER — CIPROFLOXACIN HCL 250 MG PO TABS: 250.0000 mg | ORAL_TABLET | Freq: Two times a day (BID) | ORAL | Status: DC

## 2012-06-14 NOTE — Patient Instructions (Addendum)
Let's try a new antibiotic for this urinary tract infection. Also Take 1000 mg Vit C morning & night to acidify urine for next 10 days. Continue to drink 64 oz water daily. If you have hemorrhoid, use spray bottle to clean after each BM. Take probiotics daily for next 15 to 20 days. You can get Al[facility] or the [facility] equivalent. Come back in 2-3 weeks to recheck urine. I have ordered psychiatry eval for anxiety symptoms. Pleasure to see you today! Feel better.

## 2012-06-14 NOTE — Progress Notes (Signed)
Subjective:    Patient ID: Stephanie Castro, female    DOB: May 24, 1965, 47 y.o.   MRN: [id number]  Anxiety Presents for initial (Pt here for follow up visit for UTI, but mentions that she is dealing with much personla & work-related stress. She states she has sores in scalp that are self-inflicted. Pt gives history of having been in therapy intermittently since she was 47 yo. ) visit. Onset was 1 to 6 months ago (2 mos ago, pt started picking her scalp). The problem has been unchanged. Symptoms include compulsions (picking scalp, creating sores) and excessive worry. Patient reports no chest pain, depressed mood, dizziness, nausea, palpitations, shortness of breath or suicidal ideas. Symptoms occur constantly (daily, several times day). Duration: few minutes at a time, throughout day. 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 The symptoms are aggravated by family issues and work stress.   Risk factors include marital problems (Pt states her grown son has moved in with her & created stress in family & finances. ). Her past medical history is significant for depression (Gives history of being treated for Depression w/Zoloft & lexapro several years ago after death of father. Had suicidal ideation at that time. Denies suicidal ideation today.). (Had hemorroid & constipation when 1st infection started. States both resolved several weeks ago.) Past treatments include nothing (Not used meds for current symptoms of anxiety with complusions). Prior compliance problems include medication issues (Discontinued zoloft & lexapro due to decreased libido.).  Urinary Tract Infection  This is a recurrent (This is 3rd infection in last 4 months) problem. The current episode started in the past 7 days (2 days). The problem occurs every urination. The problem has been gradually worsening. The quality of the pain is described as burning. The pain is moderate. There has been no  fever. She is sexually active. There is no history of pyelonephritis. Associated symptoms include frequency, hesitancy and urgency. Pertinent negatives include no chills, flank pain, hematuria, nausea or vomiting. Treatments tried: used prescription strength pyridium last night. The treatment provided mild relief. Her past medical history is significant for recurrent UTIs (3rd infection in 4 mos. no prior history). had hemorroid & constipation when 1st infection started. States both resolved several weeks ago.      Review of Systems  Constitutional: Negative for fever, chills, appetite change, fatigue and unexpected weight change.  Respiratory: Negative for chest tightness and shortness of breath.   Cardiovascular: Negative for chest pain and palpitations.  Gastrointestinal: Positive for abdominal pain (suprapubic pressure). Negative for nausea, vomiting, diarrhea, constipation (had problems w/constipation few months, has resolved) and blood in stool.  Genitourinary: Positive for dysuria, hesitancy, urgency, frequency and difficulty urinating. Negative for hematuria, flank pain, vaginal discharge and pelvic pain.  Allergic/Immunologic: Negative for immunocompromised state.  Neurological: Positive for syncope. Negative for dizziness and headaches.  Hematological: Negative for adenopathy.  Psychiatric/Behavioral: Positive for agitation (picking scalp, creating sores , since March). Negative for suicidal ideas.       Objective:   Physical Exam  Nursing note and vitals reviewed. Constitutional: She is oriented to person, place, and time. She appears well-developed and well-nourished.  HENT:  Head: Normocephalic and atraumatic.  Eyes: Conjunctivae are normal.  Cardiovascular: Normal rate.   Pulmonary/Chest: Effort normal.  Abdominal: Soft. She exhibits no distension and no mass. There is no tenderness. There is no rebound and no guarding.  Genitourinary:  Offered to inspect GU for hemorrhoid.  Pt declined.  Musculoskeletal:  She exhibits no tenderness (no CVA tenderness).  Neurological: She is alert and oriented to person, place, and time.  Skin: Skin is warm and dry. No rash noted. There is erythema (approx 3, 3-10mm size lesions at base of scalp. dunable to examine entire scalp, due to hair style).  Psychiatric: She has a normal mood and affect. Thought content normal.  Reports picking scalp-several sores noted at base of scalp. See skin note.           Assessment & Plan:  1. Infection of urinary tract 3rd documented infection in 4 months. Past cultures pos for e. Coli. - ciprofloxacin (CIPRO) 250 MG tablet; Take 1 tablet (250 mg total) by mouth 2 (two) times daily.  Dispense: 6 tablet; Refill: 0 - Urine culture - POCT urinalysis dipstick F/u in 2-3 weeks for urine re-check. Dipstick shows protein, blood, leukocytes. Took pyridium last night. May need to begin prophylactic ABX or urology referral.  2. Anxiety state, unspecified Pt reporting compulsive behavior-picking at scalp-creating sores; increased family & financial stress. Reports HX of depression w/suicidal ideation, denies suicidal ideation today. - Ambulatory referral to Psychiatry  3. Essential hypertension Well controlled on Dyazide.

## 2012-06-15 NOTE — Addendum Note (Signed)
Addended by: Alben Spittle, Konrad Felix [facility] on: 06/15/2012 04:24 PM   Modules accepted: Orders

## 2012-06-16 ENCOUNTER — Ambulatory Visit: Payer: 59 | Admitting: Nurse Practitioner

## 2012-06-24 ENCOUNTER — Ambulatory Visit (INDEPENDENT_AMBULATORY_CARE_PROVIDER_SITE_OTHER): Payer: 59 | Admitting: Nurse Practitioner

## 2012-06-24 ENCOUNTER — Encounter: Payer: Self-pay | Admitting: Nurse Practitioner

## 2012-06-24 VITALS — BP 124/80 | HR 72 | Temp 97.9°F | Ht 62.0 in | Wt 183.0 lb

## 2012-06-24 DIAGNOSIS — R3915 Urgency of urination: Secondary | ICD-10-CM

## 2012-06-24 DIAGNOSIS — F411 Generalized anxiety disorder: Secondary | ICD-10-CM

## 2012-06-24 LAB — POCT URINALYSIS DIPSTICK
Leukocytes, UA: NEGATIVE
Nitrite, UA: NEGATIVE
Protein, UA: NEGATIVE
Urobilinogen, UA: 0.2
pH, UA: 7

## 2012-06-24 MED ORDER — ALPRAZOLAM 0.25 MG PO TABS: 0.2500 mg | ORAL_TABLET | Freq: Two times a day (BID) | ORAL | Status: DC | PRN

## 2012-06-24 NOTE — Patient Instructions (Signed)
Your urine shows no signs of infection today, but because you have symptoms, I will send urine for culture and treat you if we see bacteria. Continue with VIT C twice daily and probiotics daily. It is OK to use AZO for comfort. Regarding indigestion type symptoms, try liquid diet for 3-4 days. Those symptoms may be related to anxiety. Try xanax as needed. If it decreases the symptoms, we know that anxiety needs to be treated. If no change in symptoms, we can pursue other work-up. Smoothie: handful of berries/melon/peaches, a banana, handful crushed ice, big spoonful vanilla yogurt, big scoop lemon or raspberry sherbet. Blend. You can add flaxseeds for omega 3.

## 2012-06-25 LAB — URINE CULTURE
Colony Count: NO GROWTH
Organism ID, Bacteria: NO GROWTH

## 2012-06-25 NOTE — Progress Notes (Signed)
Subjective:    Patient ID: Stephanie Castro, female    DOB: 1966/01/24, 47 y.o.   MRN: [id number]  Urinary Frequency  This is a chronic problem. The current episode started today (few hours ago). The problem occurs every urination (increase in frequency & some dysuria). The problem has been unchanged. The quality of the pain is described as burning. There has been no fever. She is sexually active. There is no history of pyelonephritis. Associated symptoms include frequency, nausea and urgency. Pertinent negatives include no chills, discharge, flank pain or vomiting. She has tried antibiotics (finished cipro 2 weeks ago) for the symptoms. The treatment provided significant (symptoms resolved for several days) relief. Her past medical history is significant for recurrent UTIs.  Heartburn She complains of belching, chest pain (pain under sternum), globus sensation, heartburn, nausea and a sore throat (globus sensation). She reports no choking or no coughing. This is a recurrent problem. The current episode started more than 1 month ago. The problem occurs frequently. The problem has been waxing and waning. The heartburn duration is more than one hour. The heartburn is located in the substernum. The heartburn is of moderate (started OTC zantac yesterday due to discomfort) intensity. The heartburn does not wake her from sleep. The heartburn does not limit her activity. The heartburn changes (more belching when lying down) with position. Exacerbated by: uncertain. Pertinent negatives include no fatigue. Risk factors include obesity and lack of exercise. She has tried an antacid and a PPI (started probiotics few weeks ago) for the symptoms.      Review of Systems  Constitutional: Negative for fever, chills, activity change, fatigue and unexpected weight change.  HENT: Positive for sore throat (globus sensation).   Respiratory: Negative for cough, choking, chest tightness and shortness of breath.    Cardiovascular: Positive for chest pain (pain under sternum).  Gastrointestinal: Positive for heartburn, nausea and constipation (reports small daily BM). Negative for vomiting and rectal pain.  Genitourinary: Positive for dysuria, urgency and frequency. Negative for flank pain and vaginal discharge.  Psychiatric/Behavioral: The patient is nervous/anxious (has been picking at scalp, had increased financial & family stress).        Objective:   Physical Exam  Vitals reviewed. Constitutional: She is oriented to person, place, and time. She appears well-developed and well-nourished. No distress.  HENT:  Head: Normocephalic and atraumatic.  Eyes: Conjunctivae are normal.  Cardiovascular: Normal rate.   Pulmonary/Chest: Effort normal.  Abdominal: Soft. She exhibits no distension and no mass. There is tenderness (epigastric & bilat lower quads). There is no rebound and no guarding.  Genitourinary:  No CVA tenderness  Musculoskeletal:  No CVA tenderness  Neurological: She is alert and oriented to person, place, and time.  Skin: Skin is warm and dry. Rash (healed areas in scalp, post-inflammatory change still visible, did not assess entire scalp-just base at hairline) noted.  Psychiatric: She has a normal mood and affect. Her behavior is normal. Thought content normal.  Voices feeling anxious about family stress, not picking scalp as much, plans to schedule appt w/psychiatry           Assessment & Plan:  1. Urgency of urination Neg dip. Will send culture & treat w/levaquin if continues to have e. Coli growth.   - Urine culture - POCT urinalysis dipstick  2. Anxiety state, unspecified Symps: picking at scalp-but fewer episodes, worsened indigestion. Continue omeprazole & zantac. Bowel rest for few days w/ full liquid diet. Trial xanax to r/o anxiety as  contributor to bowel symps. Appt. Pending with psychiatry.  - ALPRAZolam (XANAX) 0.25 MG tablet; Take 1 tablet (0.25 mg total) by  mouth 2 (two) times daily as needed for sleep.  Dispense: 20 tablet; Refill: 0

## 2012-06-28 ENCOUNTER — Telehealth: Payer: Self-pay | Admitting: Nurse Practitioner

## 2012-06-28 NOTE — Telephone Encounter (Signed)
See telephone note.

## 2012-07-06 ENCOUNTER — Ambulatory Visit: Payer: 59 | Admitting: Nurse Practitioner

## 2012-10-04 ENCOUNTER — Other Ambulatory Visit: Payer: Self-pay | Admitting: Nurse Practitioner

## 2012-10-04 ENCOUNTER — Ambulatory Visit (INDEPENDENT_AMBULATORY_CARE_PROVIDER_SITE_OTHER): Payer: 59 | Admitting: Nurse Practitioner

## 2012-10-04 ENCOUNTER — Encounter: Payer: Self-pay | Admitting: Nurse Practitioner

## 2012-10-04 VITALS — BP 120/74 | HR 89 | Temp 98.0°F | Ht 62.0 in | Wt 181.5 lb

## 2012-10-04 DIAGNOSIS — H8110 Benign paroxysmal vertigo, unspecified ear: Secondary | ICD-10-CM

## 2012-10-04 DIAGNOSIS — J029 Acute pharyngitis, unspecified: Secondary | ICD-10-CM

## 2012-10-04 DIAGNOSIS — Z1239 Encounter for other screening for malignant neoplasm of breast: Secondary | ICD-10-CM

## 2012-10-04 NOTE — Patient Instructions (Addendum)
For sore throat, use benzocaine throat lozenges, salt water gargles ( 1/4 tsp salt mixed with 1/4 cup warm water), and listerene gargles several times daily. It may be viral, but I will call you with strep results and prescribe antibiotic if needed. Take tylenol as needed for headache. For reflux, limit alcohol intake to 1 drink daily-2 at most, abstain if able. Continue prilosec and probiotics. Start 250 mg magnesium supplement daily-if stools too loose, cut back to 150 mg daily. Minimize use of ibuprophen, aleve, & Goody's powders as they can make gastritis/reflux worse. Tylenol is a better choice. If you feel that stress continues to affect your health, please consider seeing Dr. Evelene Croon for medication management or [facility] for cognitive behavioral therapy (no meds). Regarding benign positional vertigo, read info. Below. Keep head above your shoulders for at least 2 days. If you are not getting relief, perform procedures we did in office again-Epleys Maneuver. You can find demonstrations on YouTube. Nice to see you!  Benign Positional Vertigo Vertigo means you feel like you or your surroundings are moving when they are not. Benign positional vertigo is the most common form of vertigo. Benign means that the cause of your condition is not serious. Benign positional vertigo is more common in older adults. CAUSES  Benign positional vertigo is the result of an upset in the labyrinth system. This is an area in the middle ear that helps control your balance. This may be caused by a viral infection, head injury, or repetitive motion. However, often no specific cause is found. SYMPTOMS  Symptoms of benign positional vertigo occur when you move your head or eyes in different directions. Some of the symptoms may include:  Loss of balance and falls.  Vomiting.  Blurred vision.  Dizziness.  Nausea.  Involuntary eye movements (nystagmus). DIAGNOSIS  Benign positional vertigo is usually  diagnosed by physical exam. If the specific cause of your benign positional vertigo is unknown, your caregiver may perform imaging tests, such as magnetic resonance imaging (MRI) or computed tomography (CT). TREATMENT  Your caregiver may recommend movements or procedures to correct the benign positional vertigo. Medicines such as meclizine, benzodiazepines, and medicines for nausea may be used to treat your symptoms. In rare cases, if your symptoms are caused by certain conditions that affect the inner ear, you may need surgery. HOME CARE INSTRUCTIONS   Follow your caregiver's instructions.  Move slowly. Do not make sudden body or head movements.  Avoid driving.  Avoid operating heavy machinery.  Avoid performing any tasks that would be dangerous to you or others during a vertigo episode.  Drink enough fluids to keep your urine clear or pale yellow. SEEK IMMEDIATE MEDICAL CARE IF:   You develop problems with walking, weakness, numbness, or using your arms, hands, or legs.  You have difficulty speaking.  You develop severe headaches.  Your nausea or vomiting continues or gets worse.  You develop visual changes.  Your family or friends notice any behavioral changes.  Your condition gets worse.  You have a fever.  You develop a stiff neck or sensitivity to light. MAKE SURE YOU:   Understand these instructions.  Will watch your condition.  Will get help right away if you are not doing well or get worse. Document Released: 10/21/2005 Document Revised: 04/07/2011 Document Reviewed: 10/03/2010 [facility] Patient Information 2014 [place], [place].

## 2012-10-04 NOTE — Progress Notes (Signed)
Subjective:     Stephanie Castro is a 47 y.o. female who presents for evaluation of dizziness. The symptoms started 3 weeks ago and are unchanged. The attacks occur intermittently and last a few minutes. Positions that worsen symptoms: bending over, lying down, standing up and turning head. Previous workup/treatments: she was prescribed meclizine in past. She says it makes her so drowsy, she does not like taking it and is not sure if it helps with dizziness. Associated ear symptoms: none. Associated CNS symptoms: none. Recent infections: sore throat & headache developed yesterday.Marland Kitchen Head trauma: denied. Drug ingestion: none. Noise exposure: no occupational exposure. Family history: non-contributory.  The following portions of the patient's history were reviewed and updated as appropriate: allergies, current medications, past family history, past medical history, past social history, past surgical history and problem list.  Review of Systems Constitutional: negative for anorexia, fatigue, fevers and night sweats Eyes: negative for irritation and visual disturbance Ears, nose, mouth, throat, and face: positive for sore mouth and no nasal congestion Respiratory: positive for cough Cardiovascular: negative for chest pain, chest pressure/discomfort, irregular heart beat, lower extremity edema and near-syncope Gastrointestinal: negative for abdominal pain, change in bowel habits and c/o reflux, states she has been drinking more alcohol than usual in the last year due to stress. Genitourinary:negative for dysuria Neurological: positive for dizziness and headaches Behavioral/Psych: positive for anxiety, excessive alcohol consumption and sleep disturbance Endocrine: positive for sweating episodes-thinks she is perimenopausal, had partial hysterectomy, so no MC    Objective:    BP 120/74  Pulse 89  Temp(Src) 98 F (36.7 C) (Oral)  Ht 5\' 2"  (1.575 m)  Wt 181 lb 8 oz (82.328 kg)  BMI 33.19 kg/m2  SpO2  97% General appearance: alert, cooperative, appears stated age, no distress and mildly obese Head: Normocephalic, without obvious abnormality, atraumatic Eyes: negative findings: lids and lashes normal, conjunctivae and sclerae normal, corneas clear and pupils equal, round, reactive to light and accomodation, positive findings: nystagmus with Left sided Dix-Hallpike Maneuver Ears: normal TM's and external ear canals both ears and retracted R TM, unable to visualize entire L TM due to cerumen. Throat: abnormal findings: erythematous posterior pharynx, no exudate, tonsils not visualized Neck: supple, symmetrical, trachea midline, thyroid not enlarged, symmetric, no tenderness/mass/nodules and R anterior cervical LAD, tender, moveable Lungs: clear to auscultation bilaterally Heart: regular rate and rhythm, S1, S2 normal, no murmur, click, rub or gallop Neurologic: Alert and oriented X 3, normal strength and tone. Normal symmetric reflexes. Normal coordination and gait      Assessment:    Benign positional vertigo  Sore throat. Rapid strep neg.  Prev care-needs Tdap, flu, MMG, CPE Reflux symptoms worse in last few months, increased ETOH intake in last year, continues on prilosec Plan:    Performed Epley's Maneuver in office. See pt instructions.  Strep DNA probe pending. See instructions. Declined vaccines due to sore throat, will oder screening MMG. See instructions-limit ETOH, continue prilosec, probiotics

## 2012-10-06 ENCOUNTER — Telehealth: Payer: Self-pay | Admitting: Nurse Practitioner

## 2012-10-06 NOTE — Telephone Encounter (Signed)
Advised patient mammogram appt 10/16 1:00 [address] [place] [phone].

## 2012-10-06 NOTE — Telephone Encounter (Signed)
Strep DNA neg. Ordered URI culture-pending. Pt is very hoarse, HA resolved, low grade fever last night. No oral ulcers. Scant runny nose. Rec call back for re-eval if fever over 100.4 or no improvement by in 2 days. Rec tylenol for throat pain.

## 2012-10-06 NOTE — Telephone Encounter (Signed)
Patient's throat hurts worse. Is only able eat cold things . The throat lozenge numbs her mouth but doesn't provide relief for her throat. Patient is running fever 100. Patient is asking if there is anything else she can do.

## 2012-10-08 ENCOUNTER — Telehealth: Payer: Self-pay | Admitting: Nurse Practitioner

## 2012-10-08 DIAGNOSIS — J020 Streptococcal pharyngitis: Secondary | ICD-10-CM

## 2012-10-08 MED ORDER — AZITHROMYCIN 250 MG PO TABS: ORAL_TABLET | ORAL | Status: DC

## 2012-10-08 NOTE — Telephone Encounter (Signed)
Upper resp culture growing S. Pyogenes. Will start azithromycin-pt allergic to PCN. See phone note. Advised pt to CB if not feeling improved within 3 days.

## 2012-10-22 ENCOUNTER — Other Ambulatory Visit: Payer: Self-pay | Admitting: Family

## 2012-10-22 ENCOUNTER — Other Ambulatory Visit: Payer: Self-pay | Admitting: *Deleted

## 2012-10-25 MED ORDER — OMEPRAZOLE 40 MG PO CPDR: DELAYED_RELEASE_CAPSULE | ORAL | Status: DC

## 2012-10-25 MED ORDER — TRIAMTERENE-HCTZ 37.5-25 MG PO CAPS: ORAL_CAPSULE | ORAL | Status: DC

## 2012-12-02 ENCOUNTER — Other Ambulatory Visit: Payer: Self-pay

## 2012-12-21 ENCOUNTER — Telehealth: Payer: Self-pay | Admitting: Nurse Practitioner

## 2012-12-21 NOTE — Telephone Encounter (Signed)
Patient returned call and was given info

## 2012-12-21 NOTE — Telephone Encounter (Signed)
Please call pt: advise she had tdap in 2010 and should have immunity. We have no record of tdap for Stephanie Castro. If he has not had it, he should come in to get it. It will take 2 weeks for his boby to make antibodies. If either develop a fever & cough, they should seek medical care.

## 2012-12-21 NOTE — Telephone Encounter (Signed)
Patient and her husband have been in close contact with their grandson who has been diagnosed with Whooping cough. Neither of them have any symptoms but she is working around others and needs to know what to do from here. Please advise

## 2012-12-21 NOTE — Telephone Encounter (Signed)
LMOVM for patient to return call.

## 2012-12-21 NOTE — Telephone Encounter (Signed)
Please advise 

## 2013-04-01 ENCOUNTER — Ambulatory Visit: Payer: 59 | Admitting: Nurse Practitioner

## 2013-04-04 ENCOUNTER — Encounter: Payer: Self-pay | Admitting: Nurse Practitioner

## 2013-04-04 ENCOUNTER — Ambulatory Visit: Payer: 59 | Admitting: Nurse Practitioner

## 2013-04-04 ENCOUNTER — Ambulatory Visit (INDEPENDENT_AMBULATORY_CARE_PROVIDER_SITE_OTHER): Payer: 59 | Admitting: Nurse Practitioner

## 2013-04-04 VITALS — BP 100/70 | HR 53 | Temp 98.0°F | Ht 62.0 in | Wt 191.0 lb

## 2013-04-04 DIAGNOSIS — Z0000 Encounter for general adult medical examination without abnormal findings: Secondary | ICD-10-CM

## 2013-04-04 DIAGNOSIS — T783XXA Angioneurotic edema, initial encounter: Secondary | ICD-10-CM

## 2013-04-04 DIAGNOSIS — Z1231 Encounter for screening mammogram for malignant neoplasm of breast: Secondary | ICD-10-CM

## 2013-04-04 DIAGNOSIS — Z1239 Encounter for other screening for malignant neoplasm of breast: Secondary | ICD-10-CM

## 2013-04-04 DIAGNOSIS — K219 Gastro-esophageal reflux disease without esophagitis: Secondary | ICD-10-CM

## 2013-04-04 DIAGNOSIS — R635 Abnormal weight gain: Secondary | ICD-10-CM

## 2013-04-04 HISTORY — DX: Encounter for general adult medical examination without abnormal findings: Z00.00

## 2013-04-04 HISTORY — DX: Encounter for other screening for malignant neoplasm of breast: Z12.39

## 2013-04-04 HISTORY — DX: Angioneurotic edema, initial encounter: T78.3XXA

## 2013-04-04 LAB — CBC
HEMATOCRIT: 39.6 % (ref 36.0–46.0)
HEMOGLOBIN: 13.1 g/dL (ref 12.0–15.0)
MCHC: 33.2 g/dL (ref 30.0–36.0)
MCV: 91.6 fl (ref 78.0–100.0)
PLATELETS: 211 10*3/uL (ref 150.0–400.0)
RBC: 4.32 Mil/uL (ref 3.87–5.11)
RDW: 13.4 % (ref 11.5–14.6)
WBC: 5.4 10*3/uL (ref 4.5–10.5)

## 2013-04-04 LAB — T4, FREE: Free T4: 0.64 ng/dL (ref 0.60–1.60)

## 2013-04-04 LAB — COMPREHENSIVE METABOLIC PANEL
ALT: 12 U/L (ref 0–35)
AST: 18 U/L (ref 0–37)
Albumin: 4.2 g/dL (ref 3.5–5.2)
Alkaline Phosphatase: 56 U/L (ref 39–117)
BUN: 18 mg/dL (ref 6–23)
CALCIUM: 9.2 mg/dL (ref 8.4–10.5)
CHLORIDE: 104 meq/L (ref 96–112)
CO2: 28 meq/L (ref 19–32)
Creatinine, Ser: 0.8 mg/dL (ref 0.4–1.2)
GFR: 79.96 mL/min (ref >60.00)
Glucose, Bld: 77 mg/dL (ref 70–99)
Potassium: 3.1 mEq/L — ABNORMAL LOW (ref 3.5–5.1)
Sodium: 139 mEq/L (ref 135–145)
TOTAL PROTEIN: 7.1 g/dL (ref 6.0–8.3)
Total Bilirubin: 0.7 mg/dL (ref 0.3–1.2)

## 2013-04-04 LAB — URINALYSIS, MICROSCOPIC ONLY: WBC, UA: NONE SEEN (ref 0–?)

## 2013-04-04 LAB — LIPID PANEL
CHOL/HDL RATIO: 3
Cholesterol: 202 mg/dL — ABNORMAL HIGH (ref 0–200)
HDL: 62.6 mg/dL (ref >39.00)
LDL Cholesterol: 110 mg/dL — ABNORMAL HIGH (ref 0–99)
Triglycerides: 145 mg/dL (ref 0.0–149.0)
VLDL: 29 mg/dL (ref 0.0–40.0)

## 2013-04-04 LAB — TSH: TSH: 0.96 u[IU]/mL (ref 0.35–5.50)

## 2013-04-04 LAB — VITAMIN B12: VITAMIN B 12: 112 pg/mL — AB (ref 211–911)

## 2013-04-04 LAB — HEMOGLOBIN A1C: Hgb A1c MFr Bld: 5.1 % (ref 4.6–6.5)

## 2013-04-04 MED ORDER — OMEPRAZOLE 20 MG PO CPDR: DELAYED_RELEASE_CAPSULE | ORAL | Status: DC

## 2013-04-04 NOTE — Assessment & Plan Note (Signed)
Discussed etio for poor vascular return A

## 2013-04-04 NOTE — Progress Notes (Signed)
Pre visit review using our clinic review tool, if applicable. No additional management support is needed unless otherwise documented below in the visit note. 

## 2013-04-04 NOTE — Patient Instructions (Signed)
Our office will call with lab results. When Fluid retention is not due to organ failure/compromise, it can be due to prolonged sitting, tight clothing, sodium & MSG intake, not drinking enough water, and vascular disease. Make changes where you see needed.  I encourage you to use methods other than medication for fluid balance.  Decrease omeprazole to 20 mg daily. If reflux worsens, please call me. Calorie goal is 1700 daily to loose 1-3 pounds weekly. "Meals to go" is an app that helps with calorie counting. Follow up in 1 month.   Body Mass Index (BMI) This BMI is not intended for use with those under 61 years of age, or pregnant women, or lactating women. To estimate BMI, locate your height, then find your weight in this listing. Your BMI is located to the right of your weight. Height: 62 inches  Weight: 104 lb = BMI 19 (normal)  Weight: 109 lb = BMI 20 (normal)  Weight: 115 lb = BMI 21 (normal)  Weight: 120 lb = BMI 22 (normal)  Weight: 126 lb = BMI 23 (normal)  Weight: 131 lb = BMI 24 (normal)  Weight: 136 lb = BMI 25 (overweight)  Weight: 142 lb = BMI 26 (overweight)  Weight: 147 lb = BMI 27 (overweight)  Weight: 153 lb = BMI 28 (overweight)  Weight: 158 lb = BMI 29 (overweight)  Weight: 164 lb = BMI 30 (obese)  Weight: 169 lb = BMI 31 (obese)  Weight: 175 lb = BMI 32 (obese)  Weight: 180 lb = BMI 33 (obese)  Weight: 186 lb = BMI 34 (obese)  Weight: 191 lb = BMI 35 (obese)  Weight: 196 lb = BMI 36 (obese)  Weight: 202 lb = BMI 37 (obese)  Weight: 207 lb = BMI 38 (obese)  Weight: 213 lb = BMI 39 (obese)  Weight: 218 lb = BMI 40 (extreme obesity)  Weight: 224 lb = BMI 41 (extreme obesity)  Weight: 229 lb = BMI 42 (extreme obesity)  Weight: 235 lb = BMI 43 (extreme obesity)  Weight: 240 lb = BMI 44 (extreme obesity)  Weight: 246 lb = BMI 45 (extreme obesity)  Weight: 251 lb = BMI 46 (extreme obesity)  Weight: 256 lb = BMI 47 (extreme  obesity)  Weight: 262 lb = BMI 48 (extreme obesity)  Weight: 267 lb = BMI 49 (extreme obesity)  Weight: 273 lb = BMI 50 (extreme obesity)  Weight: 278 lb = BMI 51 (extreme obesity)  Weight: 284 lb = BMI 52 (extreme obesity)  Weight: 289 lb = BMI 53 (extreme obesity)  Weight: 295 lb = BMI 54 (extreme obesity) Source: Adapted from Clinical Guidelines on the Identification, Evaluation, and Treatment of Overweight and Obesity in Adults: The Evidence Report. HEALTH RISK CLASSIFICATION ACCORDING TO BODY MASS INDEX (BMI) Classification: Underweight.  BMI Category:  less than 18.5  Risk of developing health problems: Increased. Classification: Normal Weight.  BMI Category: 18.5 to 24.9  Risk of developing health problems: Least. Classification: Overweight.  BMI Category: 25.0 to 29.9  Risk of developing health problems: Increased. Classification: Obese class.  BMI Category:  30.0 to 34.9  Risk of developing health problems: High. Classification: Obese class II.  BMI Category:  35.0 to 39.9  Risk of developing health problems: Very high. Classification: Obese class III.  BMI Category: 40.0 or more  Risk of developing health problems: Extremely high. Note: For persons 25 years and older the 'normal' range may begin slightly above BMI 18.5 and extend into the '  overweight' range.   To clarify risk for each individual, other factors also need to be considered, such as:  Lifestyle habits.  Fitness level.  Presence or absence of other health risk conditions.  The classification system may underestimate or overestimate health risks in certain adults, such as:  Highly muscular adults. Very muscular adults, such as athletes, may have a low percentage of body fat but a large amount of muscle tissue. This can result in a BMI in the overweight range that may over estimate the risk of developing health problems.  Adults who naturally have a very lean body build.  Young adults  who have not reached full growth.  Adults over 36 years of age. For adults over age 77, more research is needed to determine if the cut-off points for the 'normal weight' range differ in any way from those for younger adults.  It is also important to note that BMI is only one part of a health risk assessment. To further clarify risk, other factors need to be considered as well.  Age, inherited traits, presence or absence of other conditions such as diabetes, high blood lipids, hypertension, and high blood glucose levels also influence the development of diseases associated with overweight. Risk factors such as poor eating habits, physical inactivity, and tobacco use can play a role in the development of diseases associated with both overweight and underweight. Consult a caregiver for a more complete assessment of your weight as it relates to health risk. It is important to discuss with your caregiver what BMI means for you as an individual. Maintaining a 'normal weight' is one element of good health. However, unhealthy eating habits, low levels of physical activity and tobacco use will increase the risk of health problems even for those within the normal weight range.  Being overweight indicates some risk to health. But research suggests that regular physical activity can decrease the risk of several health problems. Equally, a nutritious diet has been shown to decrease some of the risks associated with overweight. It is important to emphasize that a weight classification system is but one tool to assess health risks in individuals.  Document Released: 09/25/2003 Document Revised: 04/07/2011 Document Reviewed: 10/23/2004 [facility] Patient Information 2014 [place].

## 2013-04-05 ENCOUNTER — Telehealth: Payer: Self-pay | Admitting: Nurse Practitioner

## 2013-04-05 ENCOUNTER — Other Ambulatory Visit: Payer: Self-pay | Admitting: Nurse Practitioner

## 2013-04-05 DIAGNOSIS — R7989 Other specified abnormal findings of blood chemistry: Secondary | ICD-10-CM

## 2013-04-05 LAB — VITAMIN D 25 HYDROXY (VIT D DEFICIENCY, FRACTURES): VIT D 25 HYDROXY: 36 ng/mL (ref 30–89)

## 2013-04-05 NOTE — Telephone Encounter (Signed)
See phone note

## 2013-04-05 NOTE — Telephone Encounter (Signed)
Patient notified of results.

## 2013-04-05 NOTE — Progress Notes (Signed)
Subjective:    Stephanie Castro is a 48 y.o. female who presents for evaluation of edema in both ankles and feet and hands, face. The edema has been mild. Onset of symptoms was a few days ago, and patient reports symptoms have stabilized since that time. The edema is present in the morning. The patient states the problem is long-standing. The swelling has been aggravated by nothing. The swelling has been relieved by diuretics. Associated factors include: weight gain. Cardiac risk factors: obesity (BMI >= 30 kg/m2). Pt runs 2-3 miles 3days/week, denies exercise intolerance.  The following portions of the patient's history were reviewed and updated as appropriate: allergies, current medications, past family history, past medical history, past social history, past surgical history and problem list.  Review of Systems Constitutional: negative for anorexia, chills, fatigue, fevers, night sweats and positive for 10 lb weight gain in 6 mos. Eyes: negative for visual disturbance Respiratory: negative for cough and wheezing Cardiovascular: negative for chest pain, chest pressure/discomfort, fatigue, irregular heart beat, lower extremity edema, palpitations and syncope Gastrointestinal: negative, resolve of constipation since starting probiotic. Continues to have occasional feeling like food stuck in esoph-taking 40 mg omeprazole Behavioral/Psych: remote Hx of anxiety w/some compulsive behavior. Denies current symptoms.  Neuro: c/o tingling in hands & feet. Remote Hx B12 insufficiency Objective:    BP 100/70  Pulse 53  Temp(Src) 98 F (36.7 C) (Oral)  Ht 5\' 2"  (1.575 m)  Wt 191 lb (86.637 kg)  BMI 34.93 kg/m2  SpO2 96% General appearance: alert, cooperative, appears stated age and no distress Head: Normocephalic, without obvious abnormality, atraumatic Eyes: negative findings: lids and lashes normal and conjunctivae and sclerae normal Lungs: clear to auscultation bilaterally Heart: regular rate and  rhythm, S1, S2 normal, no murmur, click, rub or gallop Extremities: extremities normal, atraumatic, no cyanosis or edema and few mild superficial spider veins Pulses: 2+ and symmetric Skin: Skin color, texture, turgor normal. No rashes or lesions    Assessment:     1 peripheral edema. No edema on exam today. Wearing tight clothing. Recent unexplainable 10 lb. Weight gain.  2 tingling hands & feet, Hx B12 def. 3 prev care. Just had nml MMG-pt reports 1st & 2nd degree relatives w/breast CA 4 GERD-reports occasional feeling of food stuck in esoph. Taking 40 mg omeprazole     Plan:    1 Encourage getting up from desk every hour, wear loose clothing around abdomen, pelvis, legs. thyroid function, BUN creat. Pt desires to continue fluid pill. 2 B12, CBC today 3 Vit D, BRCAssure comprehensive test 4 Limit ETOH, decrease 20 mg omep. If tolerates.

## 2013-04-05 NOTE — Telephone Encounter (Signed)
TSH & free T4 low normal. DD: adrenal fatigue, central hypothyroidism. Symptoms: peripheral edema, weight gain, elevated LDL. Will ref to endo for further work up. Vit B12 low: recmd daily B complex liquid, dose 2000 mcg b12 daily. Recheck in 3 mos. Vit D low. Recmd D3 2000 iu daily. Recheck in 3 mos. LM on cell.

## 2013-04-21 ENCOUNTER — Ambulatory Visit (INDEPENDENT_AMBULATORY_CARE_PROVIDER_SITE_OTHER): Payer: 59 | Admitting: Internal Medicine

## 2013-04-21 ENCOUNTER — Encounter: Payer: Self-pay | Admitting: Internal Medicine

## 2013-04-21 VITALS — BP 108/68 | HR 58 | Temp 98.3°F | Resp 12 | Ht 63.0 in | Wt 194.8 lb

## 2013-04-21 DIAGNOSIS — R7989 Other specified abnormal findings of blood chemistry: Secondary | ICD-10-CM

## 2013-04-21 DIAGNOSIS — R946 Abnormal results of thyroid function studies: Secondary | ICD-10-CM

## 2013-04-21 LAB — TSH: TSH: 0.78 u[IU]/mL (ref 0.35–5.50)

## 2013-04-21 LAB — T3, FREE: T3 FREE: 3.2 pg/mL (ref 2.3–4.2)

## 2013-04-21 LAB — T4, FREE: FREE T4: 0.61 ng/dL (ref 0.60–1.60)

## 2013-04-21 NOTE — Patient Instructions (Signed)
Please stop at the lab. We will schedule another appt if labs are abnormal. Have thyroid labs (at least a TSH) checked every year.

## 2013-04-21 NOTE — Progress Notes (Signed)
Patient ID: Stephanie Castro, female   DOB: December 17, 1965, 48 y.o.   MRN: [id number]   HPI  Stephanie Castro is a 48 y.o.-year-old female, referred by her PCP, WEAVER, LAYNE C, NP, for evaluation for possible hypothyroidism.  Pt was found to have slightly low, but normal TFTs lately.  I reviewed pt's thyroid tests: Lab Results  Component Value Date   TSH 0.96 04/04/2013   FREET4 0.64 04/04/2013    She is concerned with weight gain despite diet and exercise.  Pt denies feeling nodules in neck, hoarseness, dysphagia/odynophagia, SOB with lying down.  Pt describes: - + hot flushes - + weight gain: 20 lbs in 1 year, but fluctuates by 10 lbs - + fatigue - no constipation - no dry skin - no hair falling - no depression  She had a partial hysterectomy in 2006.  She has spells at night of chills >> AP (cramps) >> dizziness. She does have vertigo and the dizziness feels like the vertigo episodes.   She has + FH of thyroid disorders in: 2 sisters - hypothyroidism. No FH of thyroid cancer.  No h/o radiation tx to head or neck. No recent use of iodine supplements. No weight loss supplements.  ROS: Constitutional: see HPI Eyes: no blurry vision, no xerophthalmia ENT: no sore throat, no nodules palpated in throat, no dysphagia/odynophagia, no hoarseness Cardiovascular: no CP/+ SOB/no palpitations/+ leg swelling Respiratory: no cough/+ SOB - with exetion (jogging) Gastrointestinal: no N/V/D/C/+ heartburn Musculoskeletal: + both muscle/joint aches Skin: no rashes Neurological: no tremors/numbness/tingling/dizziness Psychiatric: no depression/anxiety  Past Medical History  Diagnosis Date  . Hypertension   . GERD (gastroesophageal reflux disease)   . Dysplastic nevus     beneath r eye   History reviewed. No pertinent past surgical history. History   Social History  . Marital Status: Married    Spouse Name: N/A    Number of Children: 5   Occupational History  . Secondary school teacher    Social History Main Topics  . Smoking status: Former Research scientist (life sciences)  . Smokeless tobacco: Never Used     Comment: Quit in 2005  . Alcohol Use: Yes: 2-4 drinks 1-2x a week  . Drug Use: No   Current Outpatient Prescriptions on File Prior to Visit  Medication Sig Dispense Refill  . omeprazole (PRILOSEC) 20 MG capsule TAKE 1 CAPSULE (40 MG TOTAL) BY MOUTH DAILY.  30 capsule  5  . triamterene-hydrochlorothiazide (DYAZIDE) 37.5-25 MG per capsule TAKE 1 CAPSULE BY MOUTH ONCE DAILY  30 capsule  5  . ALPRAZolam (XANAX) 0.25 MG tablet Take 1 tablet (0.25 mg total) by mouth 2 (two) times daily as needed for sleep.  20 tablet  0  . nitroGLYCERIN (NITRODUR - DOSED IN MG/24 HR) 0.2 mg/hr Apply 1/4th of a patch over affected elbow and change daily  30 patch  1   No current facility-administered medications on file prior to visit.   Allergies  Allergen Reactions  . Penicillins    Family History  Problem Relation Age of Onset  . Hyperlipidemia Mother   . Cancer Mother 36    breast  . Heart attack Father   . COPD Father   . Heart attack Sister   . Hyperlipidemia Sister   . Hypertension Sister   . Hyperlipidemia Maternal Aunt   . Cancer Maternal Aunt     breast  . Hypertension Maternal Uncle   . Diabetes Maternal Uncle   . Cancer Maternal Uncle     colon  .  Hyperlipidemia Maternal Grandmother   . Diabetes Maternal Grandmother   . Cancer Maternal Grandmother     breast  . Diabetes Maternal Grandfather   . Sudden death Neg Hx    PE: BP 108/68  Pulse 58  Temp(Src) 98.3 F (36.8 C) (Oral)  Resp 12  Ht 5\' 3"  (1.6 m)  Wt 194 lb 12.8 oz (88.361 kg)  BMI 34.52 kg/m2  SpO2 99% Wt Readings from Last 3 Encounters:  04/21/13 194 lb 12.8 oz (88.361 kg)  04/04/13 191 lb (86.637 kg)  10/04/12 181 lb 8 oz (82.328 kg)   Constitutional: overweight, in NAD Eyes: PERRLA, EOMI, no exophthalmos ENT: moist mucous membranes, no thyromegaly, no cervical lymphadenopathy Cardiovascular: RRR, No  MRG Respiratory: CTA B Gastrointestinal: abdomen soft, NT, ND, BS+ Musculoskeletal: no deformities, strength intact in all 4 Skin: moist, warm, no rashes Neurological: no tremor with outstretched hands, DTR normal in all 4  ASSESSMENT: 1. ?Hypothyroidism  PLAN:  1. Patient with a low  Normal TSH, but normal fT4. She appears euthyroid. She is close to menopause and has hot flushes, overweight and fatigue. She was recently dx with B12 def and started on drops, but she did not notice an improvement. I advised her to d/w PCP to start im injections.  - we discussed about ways to improve her diet - She does not appear to have a goiter, thyroid nodules, or neck compression symptoms - we'll check thyroid tests today: TSH, free T4, free T3 and add TPO antibodies to see if she has Hashimoto thyroiditis - if these are normal, she will continue to follow with PCP with TSH every 6-12 mo.  Orders Placed This Encounter  Procedures  . TSH  . T4, free  . T3, free  . Thyroid Peroxidase Antibody   Office Visit on 04/21/2013  Component Date Value Ref Range Status  . TSH 04/21/2013 0.78  0.35 - 5.50 uIU/mL Final  . Free T4 04/21/2013 0.61  0.60 - 1.60 ng/dL Final  . T3, Free 04/21/2013 3.2  2.3 - 4.2 pg/mL Final  . Thyroid Peroxidase Antibody 04/21/2013 <10.0  <35.0 IU/mL Final   Comment:                            The thyroid microsomal antigen has been shown to be Thyroid                          Peroxidase (TPO).  This assay detects anti-TPO antibodies.   No hypothyroidism. Will let pt know.

## 2013-04-22 ENCOUNTER — Telehealth: Payer: Self-pay | Admitting: *Deleted

## 2013-04-22 DIAGNOSIS — R7989 Other specified abnormal findings of blood chemistry: Secondary | ICD-10-CM

## 2013-04-22 HISTORY — DX: Other specified abnormal findings of blood chemistry: R79.89

## 2013-04-22 LAB — THYROID PEROXIDASE ANTIBODY: Thyroperoxidase Ab SerPl-aCnc: <10 IU/mL (ref <35.0)

## 2013-04-22 NOTE — Telephone Encounter (Signed)
Pt left vm requesting call back concerning her most recent visit to endocrinologist. Pt stated that Dr. Cruzita Lederer wants pt to have B-12 injection. Returned call, LM for pt to return call.

## 2013-04-22 NOTE — Telephone Encounter (Signed)
Pt returned call. Pt said that Dr. Cruzita Lederer want pt to contact you about her b-12 levels. Pt stated that due to her low b-12 lab result Dr. Cruzita Lederer suggested that pt start b-12 injections.  Pt wants to get prescription for b-12 and have her daughter give them to her. Please advise?

## 2013-04-25 ENCOUNTER — Encounter: Payer: Self-pay | Admitting: Internal Medicine

## 2013-04-25 ENCOUNTER — Other Ambulatory Visit: Payer: Self-pay | Admitting: Nurse Practitioner

## 2013-04-25 DIAGNOSIS — E538 Deficiency of other specified B group vitamins: Secondary | ICD-10-CM

## 2013-04-25 MED ORDER — CYANOCOBALAMIN 1000 MCG/ML IJ SOLN: INTRAMUSCULAR | Status: DC

## 2013-04-25 NOTE — Telephone Encounter (Signed)
pls call pt: pls adv that oral replacement is often adequate for B12 deficiency. There is a rare autoimmune deficiency called  pernicious anemia that requires B12 injections. I do not think she has this, however, I will prescribe IM inj. If she has rapid improvement in fatigue, tingling hands & feet, then I will test her for it. Please schedule f/u appt for 1 week after she takes last IM injection. She will get 3 inj, 1 each  Week-separate each by 7 days.

## 2013-04-25 NOTE — Telephone Encounter (Signed)
Spoke with pt, advised message from [place]. Pt understood directions.

## 2013-05-06 ENCOUNTER — Ambulatory Visit: Payer: 59 | Admitting: Nurse Practitioner

## 2013-05-06 ENCOUNTER — Encounter: Payer: Self-pay | Admitting: Nurse Practitioner

## 2013-05-06 ENCOUNTER — Other Ambulatory Visit: Payer: Self-pay | Admitting: *Deleted

## 2013-05-06 MED ORDER — TRIAMTERENE-HCTZ 37.5-25 MG PO CAPS: ORAL_CAPSULE | ORAL | Status: DC

## 2013-05-10 ENCOUNTER — Other Ambulatory Visit: Payer: Self-pay | Admitting: *Deleted

## 2013-05-13 ENCOUNTER — Ambulatory Visit: Payer: 59 | Admitting: Nurse Practitioner

## 2013-05-13 ENCOUNTER — Encounter: Payer: Self-pay | Admitting: Nurse Practitioner

## 2013-05-13 ENCOUNTER — Ambulatory Visit (INDEPENDENT_AMBULATORY_CARE_PROVIDER_SITE_OTHER): Payer: 59 | Admitting: Nurse Practitioner

## 2013-05-13 VITALS — BP 126/85 | HR 73 | Temp 97.9°F | Resp 18 | Ht 63.0 in | Wt 196.0 lb

## 2013-05-13 DIAGNOSIS — E538 Deficiency of other specified B group vitamins: Secondary | ICD-10-CM

## 2013-05-13 DIAGNOSIS — E876 Hypokalemia: Secondary | ICD-10-CM

## 2013-05-13 DIAGNOSIS — Z6834 Body mass index (BMI) 34.0-34.9, adult: Secondary | ICD-10-CM

## 2013-05-13 DIAGNOSIS — N951 Menopausal and female climacteric states: Secondary | ICD-10-CM

## 2013-05-13 LAB — BASIC METABOLIC PANEL
BUN: 11 mg/dL (ref 6–23)
CALCIUM: 9.6 mg/dL (ref 8.4–10.5)
CO2: 27 mEq/L (ref 19–32)
Chloride: 101 mEq/L (ref 96–112)
Creat: 0.77 mg/dL (ref 0.50–1.10)
Glucose, Bld: 81 mg/dL (ref 70–99)
Potassium: 4.3 mEq/L (ref 3.5–5.3)
SODIUM: 138 meq/L (ref 135–145)

## 2013-05-13 NOTE — Patient Instructions (Signed)
Continue oral B12 1 dropperful twice daily. I will check level again in 4 weeks, if B12 up, we will continue oral B12. If it is not coming up, we will do injections and work up for pernicious anemia.  For weight loss, we want to reduce you health risk, I'd like for your BMI to be less than 29, so that is a loss of about 30 pounds. Read info below. Consider cutting out all refined sugar & limiting alcoholic beverages to no more than 5 oz wine daily (has 100 calories).  I will call with lab results and we will discuss phentermine for weight loss.  Exercise to Lose Weight Exercise and a healthy diet may help you lose weight. Your doctor may suggest specific exercises. EXERCISE IDEAS AND TIPS  Choose low-cost things you enjoy doing, such as walking, bicycling, or exercising to workout videos.  Take stairs instead of the elevator.  Walk during your lunch break.  Park your car further away from work or school.  Go to a gym or an exercise class.  Start with 5 to 10 minutes of exercise each day. Build up to 30 minutes of exercise 4 to 6 days a week.  Wear shoes with good support and comfortable clothes.  Stretch before and after working out.  Work out until you breathe harder and your heart beats faster.  Drink extra water when you exercise.  Do not do so much that you hurt yourself, feel dizzy, or get very short of breath. Exercises that burn about 150 calories:  Running 1  miles in 15 minutes.  Playing volleyball for 45 to 60 minutes.  Washing and waxing a car for 45 to 60 minutes.  Playing touch football for 45 minutes.  Walking 1  miles in 35 minutes.  Pushing a stroller 1  miles in 30 minutes.  Playing basketball for 30 minutes.  Raking leaves for 30 minutes.  Bicycling 5 miles in 30 minutes.  Walking 2 miles in 30 minutes.  Dancing for 30 minutes.  Shoveling snow for 15 minutes.  Swimming laps for 20 minutes.  Walking up stairs for 15 minutes.  Bicycling 4  miles in 15 minutes.  Gardening for 30 to 45 minutes.  Jumping rope for 15 minutes.  Washing windows or floors for 45 to 60 minutes. Document Released: 02/15/2010 Document Revised: 04/07/2011 Document Reviewed: 02/15/2010 [facility] Patient Information 2014 [place], [place].  DASH Diet The DASH diet stands for "Dietary Approaches to Stop Hypertension." It is a healthy eating plan that has been shown to reduce high blood pressure (hypertension) in as little as 14 days, while also possibly providing other significant health benefits. These other health benefits include reducing the risk of breast cancer after menopause and reducing the risk of type 2 diabetes, heart disease, colon cancer, and stroke. Health benefits also include weight loss and slowing kidney failure in patients with chronic kidney disease.  DIET GUIDELINES  Limit salt (sodium). Your diet should contain less than 1500 mg of sodium daily.  Limit refined or processed carbohydrates. Your diet should include mostly whole grains. Desserts and added sugars should be used sparingly.  Include small amounts of heart-healthy fats. These types of fats include nuts, oils, and tub margarine. Limit saturated and trans fats. These fats have been shown to be harmful in the body. CHOOSING FOODS  The following food groups are based on a 2000 calorie diet. See your Registered Dietitian for individual calorie needs. Grains and Grain Products (6 to 8 servings  daily)  Eat More Often: Whole-wheat bread, brown rice, whole-grain or wheat pasta, quinoa, popcorn without added fat or salt (air popped).  Eat Less Often: White bread, white pasta, white rice, cornbread. Vegetables (4 to 5 servings daily)  Eat More Often: Fresh, frozen, and canned vegetables. Vegetables may be raw, steamed, roasted, or grilled with a minimal amount of fat.  Eat Less Often/Avoid: Creamed or fried vegetables. Vegetables in a cheese sauce. Fruit (4 to 5 servings  daily)  Eat More Often: All fresh, canned (in natural juice), or frozen fruits. Dried fruits without added sugar. One hundred percent fruit juice ( cup [237 mL] daily).  Eat Less Often: Dried fruits with added sugar. Canned fruit in light or heavy syrup. [employer], Fish, and Poultry (2 servings or less daily. One serving is 3 to 4 oz [85-114 g]).  Eat More Often: Ninety percent or leaner ground beef, tenderloin, sirloin. Round cuts of beef, chicken breast, [place] breast. All fish. Grill, bake, or broil your meat. Nothing should be fried.  Eat Less Often/Avoid: Fatty cuts of meat, [place], or chicken leg, thigh, or wing. Fried cuts of meat or fish. Dairy (2 to 3 servings)  Eat More Often: Low-fat or fat-free milk, low-fat plain or light yogurt, reduced-fat or part-skim cheese.  Eat Less Often/Avoid: Milk (whole, 2%).Whole milk yogurt. Full-fat cheeses. Nuts, Seeds, and Legumes (4 to 5 servings per week)  Eat More Often: All without added salt.  Eat Less Often/Avoid: Salted nuts and seeds, canned beans with added salt. Fats and Sweets (limited)  Eat More Often: Vegetable oils, tub margarines without trans fats, sugar-free gelatin. Mayonnaise and salad dressings.  Eat Less Often/Avoid: Coconut oils, palm oils, butter, stick margarine, cream, half and half, cookies, candy, pie. FOR MORE INFORMATION The Dash Diet Eating Plan: www.dashdiet.org Document Released: 01/02/2011 Document Revised: 04/07/2011 Document Reviewed: 01/02/2011 [facility] Patient Information 2014 [place], [place].

## 2013-05-13 NOTE — Progress Notes (Signed)
Pre visit review using our clinic review tool, if applicable. No additional management support is needed unless otherwise documented below in the visit note. 

## 2013-05-14 LAB — VITAMIN B12: VITAMIN B 12: 697 pg/mL (ref 211–911)

## 2013-05-15 ENCOUNTER — Other Ambulatory Visit: Payer: Self-pay | Admitting: Nurse Practitioner

## 2013-05-15 ENCOUNTER — Encounter: Payer: Self-pay | Admitting: Nurse Practitioner

## 2013-05-15 DIAGNOSIS — E538 Deficiency of other specified B group vitamins: Secondary | ICD-10-CM | POA: Insufficient documentation

## 2013-05-15 DIAGNOSIS — N951 Menopausal and female climacteric states: Secondary | ICD-10-CM | POA: Insufficient documentation

## 2013-05-15 DIAGNOSIS — Z6834 Body mass index (BMI) 34.0-34.9, adult: Secondary | ICD-10-CM

## 2013-05-15 DIAGNOSIS — Z6835 Body mass index (BMI) 35.0-35.9, adult: Secondary | ICD-10-CM | POA: Insufficient documentation

## 2013-05-15 DIAGNOSIS — E876 Hypokalemia: Secondary | ICD-10-CM | POA: Insufficient documentation

## 2013-05-15 HISTORY — DX: Body mass index (BMI) 35.0-35.9, adult: Z68.35

## 2013-05-15 HISTORY — DX: Menopausal and female climacteric states: N95.1

## 2013-05-15 HISTORY — DX: Deficiency of other specified B group vitamins: E53.8

## 2013-05-15 MED ORDER — PHENTERMINE HCL 15 MG PO CAPS: 15.0000 mg | ORAL_CAPSULE | ORAL | Status: DC

## 2013-05-15 NOTE — Progress Notes (Signed)
Subjective:     Stephanie Castro is a 48 y.o. female and is here for a follow up for B12 deficiency. She has c/o tingling in hands & feet & low energy. B12 was in 112. She started oral supplement for a few days, then endocrinologist suggested injections. Pt had 3 weekly injections. She states energy is good for several days after injection , then start to feel sluggish after 4-5 days. Abdominal cramping that occurs frequently at night, waking her has occurred less frequently since starting injections. She is distressed over weight. Counting calories lasted for a short while. She eats carbs in am-oatmeal, sugar & sweetened creamers in coffee, and nutrigrain bars for snack. She did not complete BRCAsure testing due to cost-insurance will not cover $2500.  History   Social History  . Marital Status: Married    Spouse Name: N/A    Number of Children: N/A  . Years of Education: N/A   Occupational History  . Not on file.   Social History Main Topics  . Smoking status: Former Research scientist (life sciences)  . Smokeless tobacco: Never Used     Comment: Quit in 2005  . Alcohol Use: Yes  . Drug Use: No  . Sexual Activity: Yes    Birth Control/ Protection: Surgical   Other Topics Concern  . Not on file   Social History Narrative  . No narrative on file   Health Maintenance  Topic Date Due  . Influenza Vaccine  08/27/2013  . Pap Smear  11/17/2013  . Tetanus/tdap  04/04/2018    The following portions of the patient's history were reviewed and updated as appropriate: allergies, current medications, past medical history, past social history, past surgical history and problem list.  Review of Systems Pertinent items are noted in HPI.   Objective:    BP 126/85  Pulse 73  Temp(Src) 97.9 F (36.6 C) (Oral)  Resp 18  Ht 5\' 3"  (1.6 m)  Wt 196 lb (88.905 kg)  BMI 34.73 kg/m2  SpO2 95% General appearance: alert, cooperative, appears stated age and no distress Head: Normocephalic, without obvious abnormality,  atraumatic Eyes: negative findings: lids and lashes normal and conjunctivae and sclerae normal Lungs: clear to auscultation bilaterally Heart: regular rate and rhythm, S1, S2 normal, no murmur, click, rub or gallop Extremities: edema mild nonpitting edema ankles.    Assessment & Plan:     1. B12 deficiency Symptoms improved since B12 injections.  - Vitamin B12 If levels nml, will try oral supplement for 3-4 weeks, then check again. If oral supplementation does not sustain levels, will check for intrinsic factor ab & continue with IM injection. 2. Hypokalemia Secondary to HCTZ. Discussed foods rich in potassium. - Basic metabolic panel 3. BMI 34 Thyroid function nml. Low energy makes exercise difficult. Counting calories not successful. Motivated to lose.  Discussed phentermine: benefits & harm. Will prescribe if labs nml. Will start 37.5 qd for 4 weeks. Goal is 3% loss.  4. Perimenopausal Partial hysterectomy. Having occasional hot flash. Does not want to use low dose lexapro. Interested in natural remedies: may use black cohosh: start 20 mg bid standardized to 1 mg triterpene glycoside/20 mg T. May increase to 80 mg bid. OR evening primrose oil 2-4 g qd.  F/u 4 weeks.

## 2013-05-16 ENCOUNTER — Other Ambulatory Visit: Payer: Self-pay

## 2013-05-16 ENCOUNTER — Encounter: Payer: Self-pay | Admitting: Internal Medicine

## 2013-06-13 ENCOUNTER — Ambulatory Visit (INDEPENDENT_AMBULATORY_CARE_PROVIDER_SITE_OTHER): Payer: 59 | Admitting: Nurse Practitioner

## 2013-06-13 ENCOUNTER — Encounter: Payer: Self-pay | Admitting: Nurse Practitioner

## 2013-06-13 VITALS — BP 105/67 | HR 60 | Temp 99.2°F | Resp 18 | Ht 63.0 in | Wt 191.0 lb

## 2013-06-13 DIAGNOSIS — I10 Essential (primary) hypertension: Secondary | ICD-10-CM

## 2013-06-13 DIAGNOSIS — E538 Deficiency of other specified B group vitamins: Secondary | ICD-10-CM

## 2013-06-13 DIAGNOSIS — E876 Hypokalemia: Secondary | ICD-10-CM

## 2013-06-13 DIAGNOSIS — Z6833 Body mass index (BMI) 33.0-33.9, adult: Secondary | ICD-10-CM

## 2013-06-13 LAB — VITAMIN B12: Vitamin B-12: 331 pg/mL (ref 211–911)

## 2013-06-13 MED ORDER — HYDROCHLOROTHIAZIDE 25 MG PO TABS: 25.0000 mg | ORAL_TABLET | Freq: Every day | ORAL | Status: DC

## 2013-06-13 NOTE — Progress Notes (Signed)
Pre visit review using our clinic review tool, if applicable. No additional management support is needed unless otherwise documented below in the visit note. 

## 2013-06-13 NOTE — Assessment & Plan Note (Signed)
Started 15 mg phentermine 1 mo ago. Lost 3 % weight (5 lb). Exercising 2 week. Eating a lot of pasta & bread. Recommend Eat to Live. Eat 50 grams fiber from whole food. Continue phentermine. F/u 8 weeks.

## 2013-06-13 NOTE — Assessment & Plan Note (Signed)
Labs 1 mo ago: potassium had normalized w/increasing fresh foods. Continue HCTZ 25 mg qd. (pt feels bloated if she doesn't take it). Check bmet next visit

## 2013-06-13 NOTE — Progress Notes (Signed)
Subjective:     Stephanie LOUDENSLAGER is a 48 y.o. female who presents for f/u of weight loss, b12 deficiency, hypertension, & hot flashes. Phentermine was started 1 mo ago (15 mg qd). She lost 3 %-5 lbs. exercsing 2/week. Still eating pasta & bread. Regarding HTN: she is taking triamterene/HCTZ lowest dose. BP well controlled. Discussed stopping med. Pt states she feels "bloated if she does not take it.  Regarding hot flashes: still having them at night. Discussed low dose antidepressant at last visit-pt declined. Also did not start natural supplement as discussed, but is interested in starting. Recommend black cohosh or primrose oil.  The following portions of the patient's history were reviewed and updated as appropriate: allergies, current medications, past medical history, past social history, past surgical history and problem list.  Review of Systems Pertinent items are noted in HPI.    Objective:    BP 105/67  Pulse 60  Temp(Src) 99.2 F (37.3 C) (Temporal)  Resp 18  Ht 5\' 3"  (1.6 m)  Wt 191 lb (86.637 kg)  BMI 33.84 kg/m2  SpO2 100% BP 105/67  Pulse 60  Temp(Src) 99.2 F (37.3 C) (Temporal)  Resp 18  Ht 5\' 3"  (1.6 m)  Wt 191 lb (86.637 kg)  BMI 33.84 kg/m2  SpO2 100% General appearance: alert, cooperative, appears stated age and no distress Head: Normocephalic, without obvious abnormality, atraumatic Eyes: negative findings: lids and lashes normal and conjunctivae and sclerae normal Extremities: extremities normal, atraumatic, no cyanosis or edema Pulses: 2+ and symmetric    Assessment:     1. Essential hypertension, benign   2. Vitamin B12 deficiency   3. B12 deficiency   4. Hypokalemia   5. BMI 33.0-33.9,adult     Plan:   See problem list for complete A&P

## 2013-06-13 NOTE — Assessment & Plan Note (Signed)
Historical provider had prescribed triamterene/HCTZ 37.5/25 mg qd.  Bp in 100's. Pt states she feels "bloated" if does not take med. Will D/c triamterene. Continue HCTZ. Educated to continue high intake of fresh fruits & veggies to prevent hypokalemia. F/u 2 mos.

## 2013-06-13 NOTE — Assessment & Plan Note (Signed)
Level had normalized 1 mo after 4 weekly injections. Stephanie Castro has been taking supplement for 1 mo. Check level today. If low, w/u for AI gastritis (intrinsic factor) & restart IM inj.

## 2013-06-13 NOTE — Patient Instructions (Signed)
For hot flashes: black cohosh: start 20 mg bid, standardized to 1 mg triterpene glycoside/20 mg T. May increase up to 80 mg bid. OR take evening primrose oil 2-4 g qd. Your blood pressure is great! I am discontinuing current med & will prescribe the HCTZ only at same dose. My office will call with B12 results. Continue phentermine. Read Eat to Live by [employer] Seltzer. If you eat 50 grams of fiber daily from real food, you will lose weight (fiber from bottle does not count) and you will get potent phytochemicals that fight disease, slow aging, clean up your arteries, and prevent DNA damage that leads to cancer.

## 2013-06-14 ENCOUNTER — Telehealth: Payer: Self-pay | Admitting: Nurse Practitioner

## 2013-06-14 NOTE — Telephone Encounter (Signed)
Relevant patient education assigned to patient using Emmi. ° °

## 2013-06-14 NOTE — Telephone Encounter (Signed)
B12 improved on oral supplement, but not as high as IM injections.  LM to discuss.

## 2013-06-15 ENCOUNTER — Telehealth: Payer: Self-pay | Admitting: Nurse Practitioner

## 2013-06-15 NOTE — Telephone Encounter (Signed)
Patient checking to see if B12 results are available.

## 2013-06-15 NOTE — Telephone Encounter (Signed)
Please advise 

## 2013-06-16 ENCOUNTER — Other Ambulatory Visit: Payer: Self-pay | Admitting: *Deleted

## 2013-06-16 ENCOUNTER — Ambulatory Visit: Payer: 59

## 2013-06-16 DIAGNOSIS — Z6834 Body mass index (BMI) 34.0-34.9, adult: Secondary | ICD-10-CM

## 2013-06-16 DIAGNOSIS — E538 Deficiency of other specified B group vitamins: Secondary | ICD-10-CM

## 2013-06-16 MED ORDER — PHENTERMINE HCL 15 MG PO CAPS: 15.0000 mg | ORAL_CAPSULE | ORAL | Status: DC

## 2013-06-16 NOTE — Telephone Encounter (Signed)
Refill request for phentermine Last filled by MD on - 05/15/2013 Last Appt:06/13/2013 Next Appt:08/15/2013 Please advise refill?

## 2013-06-16 NOTE — Telephone Encounter (Signed)
Sent request to [facility] lab.

## 2013-06-16 NOTE — Telephone Encounter (Signed)
Pt would like a call when rx is sent. Call her at work.

## 2013-06-16 NOTE — Telephone Encounter (Signed)
Patient was notified of results and rx being faxed to pharmacy. Patient wanted you to know that she is very frustrated about her b-12 levels lowering.

## 2013-06-16 NOTE — Telephone Encounter (Signed)
Call lab, see if intrinsic factor antibody can be added to labs drawn earlier this week.

## 2013-06-16 NOTE — Telephone Encounter (Signed)
Please let Stephanie Castro know that I left her a message on the 19th to discuss B12 result. Level is in normal range. It is not as high as when she was getting injections. She can increase her oral dose up to 3 droppers full daily. I will check again at next appt.

## 2013-06-18 LAB — INTRINSIC FACTOR ANTIBODIES: INTRINSIC FACTOR: NEGATIVE

## 2013-07-06 DIAGNOSIS — R5383 Other fatigue: Secondary | ICD-10-CM

## 2013-07-06 DIAGNOSIS — M791 Myalgia, unspecified site: Secondary | ICD-10-CM | POA: Insufficient documentation

## 2013-07-06 DIAGNOSIS — K219 Gastro-esophageal reflux disease without esophagitis: Secondary | ICD-10-CM | POA: Insufficient documentation

## 2013-07-06 HISTORY — DX: Myalgia, unspecified site: M79.10

## 2013-07-06 HISTORY — DX: Other fatigue: R53.83

## 2013-07-07 DIAGNOSIS — R600 Localized edema: Secondary | ICD-10-CM

## 2013-07-07 HISTORY — DX: Localized edema: R60.0

## 2013-08-15 ENCOUNTER — Ambulatory Visit: Payer: 59 | Admitting: Nurse Practitioner

## 2013-10-10 ENCOUNTER — Other Ambulatory Visit: Payer: Self-pay

## 2013-10-11 ENCOUNTER — Other Ambulatory Visit: Payer: Self-pay | Admitting: Family Medicine

## 2013-10-11 DIAGNOSIS — I10 Essential (primary) hypertension: Secondary | ICD-10-CM

## 2013-10-11 MED ORDER — HYDROCHLOROTHIAZIDE 25 MG PO TABS: 25.0000 mg | ORAL_TABLET | Freq: Every day | ORAL | Status: DC

## 2013-10-11 NOTE — Telephone Encounter (Signed)
Pt requesting RF of HCTZ. RX sent to pharmacy # [id number].

## 2013-11-01 ENCOUNTER — Encounter: Payer: Self-pay | Admitting: Family Medicine

## 2013-11-01 ENCOUNTER — Ambulatory Visit (INDEPENDENT_AMBULATORY_CARE_PROVIDER_SITE_OTHER): Payer: 59 | Admitting: Family Medicine

## 2013-11-01 VITALS — BP 132/81 | HR 62 | Temp 98.2°F | Ht 63.0 in | Wt 193.0 lb

## 2013-11-01 DIAGNOSIS — N3001 Acute cystitis with hematuria: Secondary | ICD-10-CM

## 2013-11-01 LAB — POCT URINALYSIS DIPSTICK
Bilirubin, UA: NEGATIVE
Glucose, UA: NEGATIVE
KETONES UA: NEGATIVE
Nitrite, UA: NEGATIVE
PH UA: 6
PROTEIN UA: NEGATIVE
Spec Grav, UA: 1.01
Urobilinogen, UA: 0.2

## 2013-11-01 MED ORDER — SULFAMETHOXAZOLE-TMP DS 800-160 MG PO TABS: 1.0000 | ORAL_TABLET | Freq: Two times a day (BID) | ORAL | Status: DC

## 2013-11-01 NOTE — Patient Instructions (Signed)
Dr. Druscilla Brownie (office is on [address] in [place].

## 2013-11-01 NOTE — Progress Notes (Signed)
OFFICE NOTE  11/01/2013  CC: No chief complaint on file.    HPI: Patient is a 48 y.o. Caucasian female who is here for onset of urinary urgency this morning, some lower abd discomfort, feels like bladder not emptying.  No dysuria.  +Urinary frequency. No fever, no flank pain, no back pain. Has hx of recurrent UTI, most recent Feb or March 2015.  Pertinent PMH:  PMH and PSH reviewed  MEDS:  Outpatient Prescriptions Prior to Visit  Medication Sig Dispense Refill  . ALPRAZolam (XANAX) 0.25 MG tablet Take 1 tablet (0.25 mg total) by mouth 2 (two) times daily as needed for sleep.  20 tablet  0  . Cholecalciferol (VITAMIN D3) 2000 UNITS TABS Take 1 capsule by mouth daily.      . hydrochlorothiazide (HYDRODIURIL) 25 MG tablet Take 1 tablet (25 mg total) by mouth daily.  30 tablet  2  . omeprazole (PRILOSEC) 20 MG capsule TAKE 1 CAPSULE (40 MG TOTAL) BY MOUTH DAILY.  30 capsule  5  . phentermine 15 MG capsule Take 1 capsule (15 mg total) by mouth every morning.  30 capsule  1  . B Complex-Folic Acid (B COMPLEX-VITAMIN B12 PO) Take 2 drops by mouth daily.      . cyanocobalamin (,VITAMIN B-12,) 1000 MCG/ML injection Inject 1 ml (1000 mcg) into muscle q7d X 3 doses.  3 mL  0  . nitroGLYCERIN (NITRODUR - DOSED IN MG/24 HR) 0.2 mg/hr Apply 1/4th of a patch over affected elbow and change daily  30 patch  1  . triamterene-hydrochlorothiazide (DYAZIDE) 37.5-25 MG per capsule        No facility-administered medications prior to visit.    PE: Blood pressure 132/81, pulse 62, temperature 98.2 F (36.8 C), temperature source Temporal, height 5\' 3"  (1.6 m), weight 193 lb (87.544 kg), SpO2 99.00%. Gen: Alert, well appearing.  Patient is oriented to person, place, time, and situation. No further exam today. LAB: CC UA showed trace intact blood, small LEU  IMPRESSION AND PLAN:  UTI. Start bactrim DS 1 bid x 3d.  Take 2 more days if all sx's not completely resolved at that time. Send urine for  c/s.  An After Visit Summary was printed and given to the patient.  FOLLOW UP: prn

## 2013-11-01 NOTE — Progress Notes (Signed)
Pre visit review using our clinic review tool, if applicable. No additional management support is needed unless otherwise documented below in the visit note. 

## 2013-11-02 LAB — URINE CULTURE: Colony Count: >=100000

## 2014-01-13 ENCOUNTER — Other Ambulatory Visit: Payer: Self-pay | Admitting: Nurse Practitioner

## 2014-01-13 ENCOUNTER — Telehealth: Payer: Self-pay

## 2014-01-13 DIAGNOSIS — I10 Essential (primary) hypertension: Secondary | ICD-10-CM

## 2014-01-13 MED ORDER — HYDROCHLOROTHIAZIDE 25 MG PO TABS: 25.0000 mg | ORAL_TABLET | Freq: Every day | ORAL | Status: DC

## 2014-01-13 NOTE — Telephone Encounter (Signed)
Refills sent. She will need to schedule OV in March or April.

## 2014-01-13 NOTE — Telephone Encounter (Signed)
[facility] requesting refills on HCTZ 25mg . Last OV 11/01/2013. Last refill 10/11/2013.

## 2014-01-16 NOTE — Telephone Encounter (Signed)
Left detailed message on pt's vm

## 2014-02-21 ENCOUNTER — Encounter: Payer: Self-pay | Admitting: Nurse Practitioner

## 2014-02-21 ENCOUNTER — Ambulatory Visit (INDEPENDENT_AMBULATORY_CARE_PROVIDER_SITE_OTHER): Payer: 59 | Admitting: Nurse Practitioner

## 2014-02-21 VITALS — BP 92/60 | HR 79 | Temp 97.7°F | Ht 63.0 in | Wt 188.0 lb

## 2014-02-21 DIAGNOSIS — E559 Vitamin D deficiency, unspecified: Secondary | ICD-10-CM

## 2014-02-21 DIAGNOSIS — M79672 Pain in left foot: Secondary | ICD-10-CM

## 2014-02-21 DIAGNOSIS — R609 Edema, unspecified: Secondary | ICD-10-CM

## 2014-02-21 DIAGNOSIS — L989 Disorder of the skin and subcutaneous tissue, unspecified: Secondary | ICD-10-CM

## 2014-02-21 DIAGNOSIS — I10 Essential (primary) hypertension: Secondary | ICD-10-CM

## 2014-02-21 DIAGNOSIS — Z6833 Body mass index (BMI) 33.0-33.9, adult: Secondary | ICD-10-CM

## 2014-02-21 DIAGNOSIS — E538 Deficiency of other specified B group vitamins: Secondary | ICD-10-CM

## 2014-02-21 LAB — VITAMIN B12: Vitamin B-12: 195 pg/mL — ABNORMAL LOW (ref 211–911)

## 2014-02-21 LAB — VITAMIN D 25 HYDROXY (VIT D DEFICIENCY, FRACTURES): VITD: 26.98 ng/mL — AB (ref 30.00–100.00)

## 2014-02-21 LAB — TSH: TSH: 2.27 u[IU]/mL (ref 0.35–4.50)

## 2014-02-21 LAB — T4, FREE: Free T4: 0.67 ng/dL (ref 0.60–1.60)

## 2014-02-21 MED ORDER — HYDROCHLOROTHIAZIDE 12.5 MG PO TABS: 12.5000 mg | ORAL_TABLET | Freq: Every day | ORAL | Status: DC

## 2014-02-21 MED ORDER — PHENTERMINE HCL 15 MG PO CAPS: 15.0000 mg | ORAL_CAPSULE | ORAL | Status: DC

## 2014-02-21 NOTE — Patient Instructions (Signed)
My office will call with lab results.  Decrease blood pressure pill to 1/2 tablet. When you get new prescription, take whole pill. This medication causes you to loose potassium, which can affect your heart function. Your last labs showed low potassium. Eat 2-3 servings fresh fruit daily to keep up potassium level.   Please see orthopedist about foot pain.  Please see dermatology regarding lesion on face.  Read Eat to Live to encourage you to seek foods that nourish your body.  Exercise 30 minutes daily.   See you in 12 weeks to review phentermine.

## 2014-02-21 NOTE — Progress Notes (Signed)
Pre visit review using our clinic review tool, if applicable. No additional management support is needed unless otherwise documented below in the visit note. 

## 2014-02-22 ENCOUNTER — Encounter: Payer: Self-pay | Admitting: Nurse Practitioner

## 2014-02-22 ENCOUNTER — Telehealth: Payer: Self-pay | Admitting: Nurse Practitioner

## 2014-02-22 DIAGNOSIS — E538 Deficiency of other specified B group vitamins: Secondary | ICD-10-CM

## 2014-02-22 LAB — BASIC METABOLIC PANEL
BUN: 11 mg/dL (ref 6–23)
CHLORIDE: 102 meq/L (ref 96–112)
CO2: 27 mEq/L (ref 19–32)
Calcium: 9.6 mg/dL (ref 8.4–10.5)
Creatinine, Ser: 0.75 mg/dL (ref 0.40–1.20)
GFR: 87.06 mL/min (ref >60.00)
Glucose, Bld: 95 mg/dL (ref 70–99)
Potassium: 3.6 mEq/L (ref 3.5–5.1)
Sodium: 137 mEq/L (ref 135–145)

## 2014-02-22 LAB — THYROID PEROXIDASE ANTIBODY: Thyroperoxidase Ab SerPl-aCnc: 1 IU/mL (ref <9)

## 2014-02-22 NOTE — Telephone Encounter (Signed)
pls call pt: Advise  Labs: Vitamin D is low. She should take D3 5000 iu daily with meal. B12 is low again, indicating she isn't taking it daily. She should start weekly B12 injections for 4 doses. Ask if she wants to come here or get from pharmacy. After inj finished, start oral B complex-she can get tablets if she doesn't like liquid. She should take enough Tablets to get 2400 mcg of b12 daily. I want to check level in 7 weeks. Pls sched lab appt.  All other labs are normal.

## 2014-02-22 NOTE — Telephone Encounter (Signed)
Patient notified of results. Patient would like b-12 injections sent to [place] and her daughter will give inj to her at home. Patient already has needles and syringes.

## 2014-02-23 ENCOUNTER — Other Ambulatory Visit: Payer: Self-pay | Admitting: Nurse Practitioner

## 2014-02-23 DIAGNOSIS — E538 Deficiency of other specified B group vitamins: Secondary | ICD-10-CM

## 2014-02-23 MED ORDER — CYANOCOBALAMIN 1000 MCG/ML IJ SOLN: 1000.0000 ug | INTRAMUSCULAR | Status: DC

## 2014-02-24 DIAGNOSIS — C44320 Squamous cell carcinoma of skin of unspecified parts of face: Secondary | ICD-10-CM

## 2014-02-24 DIAGNOSIS — E559 Vitamin D deficiency, unspecified: Secondary | ICD-10-CM | POA: Insufficient documentation

## 2014-02-24 DIAGNOSIS — M79672 Pain in left foot: Secondary | ICD-10-CM | POA: Insufficient documentation

## 2014-02-24 HISTORY — DX: Squamous cell carcinoma of skin of unspecified parts of face: C44.320

## 2014-02-24 HISTORY — DX: Vitamin D deficiency, unspecified: E55.9

## 2014-02-24 NOTE — Progress Notes (Signed)
Subjective:     Stephanie Castro is a 49 y.o. female presents w/c/o L foot pain, lesion on face, & requests refills of HCTZ & phentermine.  Foot: Pain across top of foot, mild swelling when walks or runs, feels like something inside foot when steps. She has long Hx of wearing high heel shoes, but has stopped wearing heels in last month. She has not taken anything for pain. Swelling resolves once off feet. Face: scaly lesion adjacent to R eye. She has had several dermabrasion treatments-this lesion always comes back. No bleeding. Noticed in last 6 mos. Phentermine: Struggles with weight. Lost 5 lbs since last OV. Discussed risk for stroke & heart arrhythmia. Will prescribe for 12 weeks only. HCTZ: pt takes this because she says she swells if she doesn't. No HTN. BP low today. Discussed she does not need HCTZ. Pt hesitant to d/c. Caution low potassium.  The following portions of the patient's history were reviewed and updated as appropriate: allergies, current medications, past medical history, past social history, past surgical history and problem list.  Review of Systems Respiratory: negative for cough Cardiovascular: negative for palpitations Gastrointestinal: positive for reflux symptoms and worse when drinks ETOH, takes occasional OTC PPI    Objective:    BP 92/60 mmHg  Pulse 79  Temp(Src) 97.7 F (36.5 C) (Oral)  Ht 5\' 3"  (1.6 m)  Wt 188 lb (85.276 kg)  BMI 33.31 kg/m2  SpO2 97% BP 92/60 mmHg  Pulse 79  Temp(Src) 97.7 F (36.5 C) (Oral)  Ht 5\' 3"  (1.6 m)  Wt 188 lb (85.276 kg)  BMI 33.31 kg/m2  SpO2 97% General appearance: alert, cooperative, appears stated age and no distress Head: Normocephalic, without obvious abnormality, atraumatic Eyes: negative findings: lids and lashes normal and conjunctivae and sclerae normal Neck: no adenopathy, no carotid bruit, supple, symmetrical, trachea midline and thyroid not enlarged, symmetric, no tenderness/mass/nodules Lungs: clear to  auscultation bilaterally Heart: regular rate and rhythm, S1, S2 normal, no murmur, click, rub or gallop Extremities: edema none and few spider veins, tender at L 2nd & 3rd web of toes, o/w nml exam Skin: light brown & pink lesion adjacent to R eye, few telangiectasias visible.    Assessment: plan   1. BMI 33.0-33.9,adult - phentermine 15 MG capsule; Take 1 capsule (15 mg total) by mouth every morning.  Dispense: 30 capsule; Refill: 1 - TSH - Thyroid peroxidase antibody - T4, free  2. B12 deficiency - Vitamin B12  3. Vitamin D deficiency - Vit D  25 hydroxy (rtn osteoporosis monitoring)  4. Peripheral edema Subjective, none on exam - hydrochlorothiazide (HYDRODIURIL) 12.5 MG tablet; Take 1 tablet (12.5 mg total) by mouth daily.  Dispense: 90 tablet; Refill: 1 - Basic metabolic panel - TSH  5. Foot pain, left Suspect morton's neuroma - Ambulatory referral to Orthopedics  6. Skin lesion of face Few telangiectasias - Ambulatory referral to Dermatology  F/u 12 weeks See pt instructions

## 2014-03-03 ENCOUNTER — Encounter: Payer: Self-pay | Admitting: Nurse Practitioner

## 2014-05-23 ENCOUNTER — Ambulatory Visit (INDEPENDENT_AMBULATORY_CARE_PROVIDER_SITE_OTHER): Payer: 59 | Admitting: Nurse Practitioner

## 2014-05-23 ENCOUNTER — Encounter: Payer: Self-pay | Admitting: Nurse Practitioner

## 2014-05-23 VITALS — BP 110/68 | HR 84 | Temp 98.4°F | Ht 63.0 in | Wt 186.0 lb

## 2014-05-23 DIAGNOSIS — Z1239 Encounter for other screening for malignant neoplasm of breast: Secondary | ICD-10-CM

## 2014-05-23 DIAGNOSIS — Z6832 Body mass index (BMI) 32.0-32.9, adult: Secondary | ICD-10-CM

## 2014-05-23 DIAGNOSIS — E559 Vitamin D deficiency, unspecified: Secondary | ICD-10-CM | POA: Diagnosis not present

## 2014-05-23 DIAGNOSIS — C44320 Squamous cell carcinoma of skin of unspecified parts of face: Secondary | ICD-10-CM

## 2014-05-23 DIAGNOSIS — E669 Obesity, unspecified: Secondary | ICD-10-CM

## 2014-05-23 DIAGNOSIS — T783XXD Angioneurotic edema, subsequent encounter: Secondary | ICD-10-CM

## 2014-05-23 DIAGNOSIS — E538 Deficiency of other specified B group vitamins: Secondary | ICD-10-CM | POA: Diagnosis not present

## 2014-05-23 DIAGNOSIS — E876 Hypokalemia: Secondary | ICD-10-CM

## 2014-05-23 DIAGNOSIS — Z1231 Encounter for screening mammogram for malignant neoplasm of breast: Secondary | ICD-10-CM

## 2014-05-23 LAB — COMPREHENSIVE METABOLIC PANEL
ALBUMIN: 4.3 g/dL (ref 3.5–5.2)
ALK PHOS: 67 U/L (ref 39–117)
ALT: 13 U/L (ref 0–35)
AST: 20 U/L (ref 0–37)
BILIRUBIN TOTAL: 0.8 mg/dL (ref 0.2–1.2)
BUN: 12 mg/dL (ref 6–23)
CO2: 31 mEq/L (ref 19–32)
Calcium: 9.4 mg/dL (ref 8.4–10.5)
Chloride: 103 mEq/L (ref 96–112)
Creatinine, Ser: 0.74 mg/dL (ref 0.40–1.20)
GFR: 88.33 mL/min (ref >60.00)
Glucose, Bld: 88 mg/dL (ref 70–99)
Potassium: 4.1 mEq/L (ref 3.5–5.1)
Sodium: 137 mEq/L (ref 135–145)
TOTAL PROTEIN: 6.6 g/dL (ref 6.0–8.3)

## 2014-05-23 LAB — LIPID PANEL
CHOL/HDL RATIO: 3
Cholesterol: 173 mg/dL (ref 0–200)
HDL: 58.7 mg/dL (ref >39.00)
LDL Cholesterol: 94 mg/dL (ref 0–99)
NonHDL: 114.3
Triglycerides: 101 mg/dL (ref 0.0–149.0)
VLDL: 20.2 mg/dL (ref 0.0–40.0)

## 2014-05-23 LAB — CBC WITH DIFFERENTIAL/PLATELET
Basophils Absolute: 0 10*3/uL (ref 0.0–0.1)
Basophils Relative: 0.6 % (ref 0.0–3.0)
EOS PCT: 2.1 % (ref 0.0–5.0)
Eosinophils Absolute: 0.1 10*3/uL (ref 0.0–0.7)
HCT: 39.2 % (ref 36.0–46.0)
Hemoglobin: 13.3 g/dL (ref 12.0–15.0)
Lymphocytes Relative: 29.1 % (ref 12.0–46.0)
Lymphs Abs: 1.6 10*3/uL (ref 0.7–4.0)
MCHC: 34 g/dL (ref 30.0–36.0)
MCV: 90.2 fl (ref 78.0–100.0)
Monocytes Absolute: 0.5 10*3/uL (ref 0.1–1.0)
Monocytes Relative: 10 % (ref 3.0–12.0)
Neutro Abs: 3.2 10*3/uL (ref 1.4–7.7)
Neutrophils Relative %: 58.2 % (ref 43.0–77.0)
PLATELETS: 202 10*3/uL (ref 150.0–400.0)
RBC: 4.35 Mil/uL (ref 3.87–5.11)
RDW: 13.9 % (ref 11.5–15.5)
WBC: 5.4 10*3/uL (ref 4.0–10.5)

## 2014-05-23 LAB — MICROALBUMIN / CREATININE URINE RATIO
Creatinine,U: 107.3 mg/dL
Microalb Creat Ratio: 0.7 mg/g (ref 0.0–30.0)
Microalb, Ur: <0.7 mg/dL (ref 0.0–1.9)

## 2014-05-23 LAB — VITAMIN D 25 HYDROXY (VIT D DEFICIENCY, FRACTURES): VITD: 23.19 ng/mL — ABNORMAL LOW (ref 30.00–100.00)

## 2014-05-23 LAB — HEMOGLOBIN A1C: HEMOGLOBIN A1C: 4.8 % (ref 4.6–6.5)

## 2014-05-23 LAB — VITAMIN B12: Vitamin B-12: 238 pg/mL (ref 211–911)

## 2014-05-23 NOTE — Patient Instructions (Signed)
Great job with exercise! BMI is 32-lowest I've seen. Keep up good work.  Continue to cut out refined sugar: anything that is sweet when you eat or drink it except fresh fruit. Cut out refined grains: white bread, rolls, biscuits, bagels, muffins, pasta and cereals. Choose grains with 4 gm or more of fiber per serving.   My office will call with lab results.  Keep mammograms up to date.  Nice to see you!

## 2014-05-23 NOTE — Progress Notes (Signed)
Subjective:     Stephanie Castro is a 49 y.o. female presents for f/u b12 def, Vit d def, wt loss management, peripheral edema, GERD. Also reviewed preventive care: pt reports had MMG & PAP last year w/Dr Dorette [facility], although there are no results in care everywhere for these tests. Pt says both results were nml. b12 def: completed 4 inj q7d, currently taking oral Bcomplex to get 2400 mcg daily. NO intol SE. Exercising daily. Feels well. Vit d def: taking about 1000 iu daily. No intol SE. Last level was 26. Goal is 49. Discussed benefits & indications.  wt loss management: BMI 32-lowest in many yrs. Took phentermine about 8 weeks since last OV. Stopped 4 weeks ago. Does suppress appetitie at 15 mg/d, but does not wish to continue. She is enjoying exercising & wishes to continue w/current regimen. Discussed benefit of healthy BMI.   peripheral edema: long-standing, by pt report only. Decreased dose at last OV from 25 mg to 12.5 mg qd. She has had low K+ on higher dose. Says lower dose controls edema.  GERD: symptoms have become less frequent w/diet changes. Takes prilosec about 1/mo if needed.   The following portions of the patient's history were reviewed and updated as appropriate: allergies, current medications, past medical history, past social history, past surgical history and problem list.  Review of Systems Constitutional: negative for fatigue Gastrointestinal: negative for abdominal pain, constipation and diarrhea Integument/breast: positive for saw Dr Denna Haggard. squamous cell carcinoma face. will cont to f/u w/Dr Denna Haggard.  Eyes: exam last yr. Has glasses for distance & close-up. Doesn't wear them-cause dizzyness. Only uses readers.  Objective:    BP 110/68 mmHg  Pulse 84  Temp(Src) 98.4 F (36.9 C) (Oral)  Ht 5\' 3"  (1.6 m)  Wt 186 lb (84.369 kg)  BMI 32.96 kg/m2  SpO2 100% BP 110/68 mmHg  Pulse 84  Temp(Src) 98.4 F (36.9 C) (Oral)  Ht 5\' 3"  (1.6 m)  Wt 186 lb (84.369  kg)  BMI 32.96 kg/m2  SpO2 100% General appearance: alert, cooperative, appears stated age and no distress Head: Normocephalic, without obvious abnormality, atraumatic Eyes: negative findings: lids and lashes normal and conjunctivae and sclerae normal Neck: no adenopathy, no carotid bruit, supple, symmetrical, trachea midline and thyroid not enlarged, symmetric, no tenderness/mass/nodules Lungs: clear to auscultation bilaterally Heart: regular rate and rhythm, S1, S2 normal, no murmur, click, rub or gallop Extremities: extremities normal, atraumatic, no cyanosis or edema Pulses: 2+ and symmetric Skin: slightly raised lesion adjacent to R eye, rust colored. scab-like. Neurologic: Grossly normal    Assessment:Plan   1. Vitamin B12 deficiency (non anemic) Taking B complex qd to gwt 2400 mcg B12 - Vitamin B12 - CBC with Differential/Platelet  2. Vitamin D deficiency Taking 1000 iu qd - Vit D  25 hydroxy (rtn osteoporosis monitoring)  3. Periodic edema, subsequent encounter Continue HCTZ 12.5 mg - Comprehensive metabolic panel - Microalbumin / creatinine urine ratio  4. BMI 32.0-32.9,adult Continue exercise & diet changes Stop phentermine  - Lipid panel - Hemoglobin A1c  5. Squamous cell carcinoma, face Cont to f/u w/Dr Denna Haggard, His notes reviewed dated 03/14/14  6. Breast cancer screening, high risk patient Pt states she had MMG last yr w/Dr Cathren Harsh Will request records-no results in care everywhere  7. Hypokalemia R/t HCTZ Decreased dose last OV CMET today  F/u 1 yr-wt, HCTZ, B12, vit D; sooner PRN labs.

## 2014-05-23 NOTE — Progress Notes (Signed)
Pre visit review using our clinic review tool, if applicable. No additional management support is needed unless otherwise documented below in the visit note. 

## 2014-05-24 ENCOUNTER — Telehealth: Payer: Self-pay | Admitting: Nurse Practitioner

## 2014-05-24 DIAGNOSIS — E559 Vitamin D deficiency, unspecified: Secondary | ICD-10-CM

## 2014-05-24 DIAGNOSIS — E538 Deficiency of other specified B group vitamins: Secondary | ICD-10-CM

## 2014-05-24 MED ORDER — CYANOCOBALAMIN 1000 MCG/ML IJ SOLN: 1000.0000 ug | INTRAMUSCULAR | Status: DC

## 2014-05-24 MED ORDER — VITAMIN D3 1.25 MG (50000 UT) PO CAPS: 1.0000 | ORAL_CAPSULE | ORAL | Status: DC

## 2014-05-24 NOTE — Telephone Encounter (Signed)
Called and informed patient. She is going to come to OR for lab and will call in advance to schedule appt.

## 2014-05-24 NOTE — Telephone Encounter (Signed)
pls call pt: Advise 2 concerns with labs: B12 low again. Start weekly inj X4 weeks, then she'll probably be OK with monthly inj & oral supplement. I want to check level 2 weeks after last weekly inj to make those inj brought her level over 400. She can go to [facility] in [place] for lab if more convenient. I pended order, please adjust accordingly.  Vit d too low. I sent script. She will take weekly for 12 weeks, then I will recheck level.  All other labs look great!

## 2014-06-06 ENCOUNTER — Encounter: Payer: Self-pay | Admitting: Nurse Practitioner

## 2014-06-27 ENCOUNTER — Other Ambulatory Visit (INDEPENDENT_AMBULATORY_CARE_PROVIDER_SITE_OTHER): Payer: 59

## 2014-06-27 DIAGNOSIS — E538 Deficiency of other specified B group vitamins: Secondary | ICD-10-CM | POA: Diagnosis not present

## 2014-06-27 LAB — VITAMIN B12: Vitamin B-12: 375 pg/mL (ref 211–911)

## 2014-06-28 ENCOUNTER — Telehealth: Payer: Self-pay | Admitting: Nurse Practitioner

## 2014-06-28 DIAGNOSIS — E538 Deficiency of other specified B group vitamins: Secondary | ICD-10-CM

## 2014-06-28 MED ORDER — CYANOCOBALAMIN 1000 MCG/ML IJ SOLN
1000.0000 ug | INTRAMUSCULAR | Status: DC
Start: 2014-06-28 — End: 2015-04-25

## 2014-06-28 NOTE — Telephone Encounter (Signed)
pls call pt: Advise Start B complex to get 2400 micrograms (MCG) or 2 mg B12 daily. I also want her to have  Monthly B12 injection. I will prescribe, she self-administers. I will check level again at appt in July.

## 2014-06-28 NOTE — Telephone Encounter (Signed)
LMOVM-identified about lab results. Patient to call back with any questions or concerns. DPR Signed.  

## 2014-07-14 ENCOUNTER — Other Ambulatory Visit: Payer: Self-pay | Admitting: Nurse Practitioner

## 2014-07-14 ENCOUNTER — Encounter: Payer: Self-pay | Admitting: Nurse Practitioner

## 2014-07-14 DIAGNOSIS — Z713 Dietary counseling and surveillance: Secondary | ICD-10-CM

## 2014-07-14 MED ORDER — PHENTERMINE HCL 37.5 MG PO CAPS: 37.5000 mg | ORAL_CAPSULE | ORAL | Status: DC

## 2014-08-12 ENCOUNTER — Telehealth: Payer: Self-pay | Admitting: Nurse Practitioner

## 2014-08-12 DIAGNOSIS — Z713 Dietary counseling and surveillance: Secondary | ICD-10-CM

## 2014-08-14 MED ORDER — PHENTERMINE HCL 37.5 MG PO CAPS: 37.5000 mg | ORAL_CAPSULE | ORAL | Status: DC

## 2014-08-14 NOTE — Telephone Encounter (Signed)
Rx printed.  Layne to sign it when she returns to office tomorrow.

## 2014-08-14 NOTE — Telephone Encounter (Signed)
Spoke to pharmacy. Medication was okay to phone in.  Rx phoned in for patient convenience.  Pt aware.

## 2014-08-21 ENCOUNTER — Encounter: Payer: Self-pay | Admitting: Nurse Practitioner

## 2014-08-21 ENCOUNTER — Telehealth: Payer: Self-pay | Admitting: Nurse Practitioner

## 2014-08-21 ENCOUNTER — Ambulatory Visit (INDEPENDENT_AMBULATORY_CARE_PROVIDER_SITE_OTHER): Payer: 59 | Admitting: Nurse Practitioner

## 2014-08-21 VITALS — BP 102/70 | HR 76 | Resp 12 | Ht 62.0 in | Wt 183.0 lb

## 2014-08-21 DIAGNOSIS — E559 Vitamin D deficiency, unspecified: Secondary | ICD-10-CM | POA: Diagnosis not present

## 2014-08-21 DIAGNOSIS — M25569 Pain in unspecified knee: Secondary | ICD-10-CM | POA: Insufficient documentation

## 2014-08-21 DIAGNOSIS — M25561 Pain in right knee: Secondary | ICD-10-CM

## 2014-08-21 DIAGNOSIS — R609 Edema, unspecified: Secondary | ICD-10-CM

## 2014-08-21 DIAGNOSIS — E538 Deficiency of other specified B group vitamins: Secondary | ICD-10-CM | POA: Diagnosis not present

## 2014-08-21 DIAGNOSIS — R946 Abnormal results of thyroid function studies: Secondary | ICD-10-CM | POA: Diagnosis not present

## 2014-08-21 DIAGNOSIS — Z713 Dietary counseling and surveillance: Secondary | ICD-10-CM

## 2014-08-21 DIAGNOSIS — R7989 Other specified abnormal findings of blood chemistry: Secondary | ICD-10-CM

## 2014-08-21 LAB — VITAMIN B12: Vitamin B-12: 333 pg/mL (ref 211–911)

## 2014-08-21 LAB — VITAMIN D 25 HYDROXY (VIT D DEFICIENCY, FRACTURES): VITD: 32.21 ng/mL (ref 30.00–100.00)

## 2014-08-21 LAB — TSH: TSH: 3.09 u[IU]/mL (ref 0.35–4.50)

## 2014-08-21 LAB — T4, FREE: Free T4: 0.87 ng/dL (ref 0.60–1.60)

## 2014-08-21 MED ORDER — PHENTERMINE HCL 37.5 MG PO CAPS: 37.5000 mg | ORAL_CAPSULE | ORAL | Status: DC

## 2014-08-21 MED ORDER — HYDROCHLOROTHIAZIDE 12.5 MG PO TABS: 12.5000 mg | ORAL_TABLET | Freq: Every day | ORAL | Status: DC

## 2014-08-21 NOTE — Telephone Encounter (Signed)
pls call pt: Advise Continue monthly b12 inj & oral supplement Thyroid is normal Vit D is better, but start 5000iu D3 daily

## 2014-08-21 NOTE — Progress Notes (Signed)
Subjective:     Stephanie Castro is a 49 y.o. female presents for f/u of B12 & vit D def, peripheral edema, ongoing weight loss management, and has new c/o R knee pain. B12: symptoms-chronic fatigue. No anemia. Level drops below 400 when taking oral supplement. She completed 4 weekly injections in May & had last monthly inj 1 mo. Ago, She is also taking B complex to get 2400 mcg daily. She increased energy after getting parenteral injections. She is neg for IF ab.  vit D def: completed 12 weeks prescription D3. No intol SE. Will check level today. peripheral edema: Continues to take 12.5 mf HCTZ. Pt reports if she doesn't take it, she "swells". Last CMET nml. ongoing weight loss management: exercising regularly, working with trainer. Started phentermine last week. No intol SE. 3 lb loss in 12 weeks-last OV. Will continue.  new c/o R knee pain: slow progressive onset. Had knee xrays 10 yrs ago due to pain. She is having more knee pain since increased exercise. Wears knee sleeve when active-somewhat helpful. Reports painful crepitus, occasional warmth & swelling. Rest helps. Pain is worse in am, gets better w/activity.  The following portions of the patient's history were reviewed and updated as appropriate: allergies, current medications, past medical history, past social history, past surgical history and problem list.  Review of Systems Pertinent items are noted in HPI.    Objective:    BP 102/70 mmHg  Pulse 76  Resp 12  Ht 5\' 2"  (1.575 m)  Wt 183 lb (83.008 kg)  BMI 33.46 kg/m2 BP 102/70 mmHg  Pulse 76  Resp 12  Ht 5\' 2"  (1.575 m)  Wt 183 lb (83.008 kg)  BMI 33.46 kg/m2 General appearance: alert, cooperative, appears stated age and no distress Eyes: negative findings: lids and lashes normal and conjunctivae and sclerae normal Lungs: clear to auscultation bilaterally Heart: regular rate and rhythm, S1, S2 normal, no murmur, click, rub or gallop Neurologic: Grossly normal MSK: R  knee-slight warmth, posterior swelling, FROM, crepitus.    Assessment:Plan     1. Knee pain, acute, right Cont to wear knee sleeve when active Start curcumarin 1000-2000 mg qd - Ambulatory referral to Sports Medicine  2. Low TSH level Chronic fatigue - T4, free - TSH  3. Vitamin D deficiency - Vit D  25 hydroxy (rtn osteoporosis monitoring)  4. Vitamin B 12 deficiency - Vitamin B12  5. peripehral edema continue - hydrochlorothiazide (HYDRODIURIL) 12.5 MG tablet; Take 1 tablet (12.5 mg total) by mouth daily.  Dispense: 90 tablet; Refill: 1  6. Weight loss counseling, encounter for Continue exercise, diet changes continue - phentermine 37.5 MG capsule; Take 1 capsule (37.5 mg total) by mouth every morning.  Dispense: 30 capsule; Refill: 1  F/u 4 weeks-wt loss management

## 2014-08-21 NOTE — Patient Instructions (Signed)
Start 1000-2000 mg curcumarin daily for knee pain.  Please see Dr Barbaraann Barthel for evaluation.  My office will call with lab results.  It has been a pleasure to partner with you in your healthcare!

## 2014-08-22 NOTE — Telephone Encounter (Signed)
Patient aware of results.

## 2014-08-22 NOTE — Telephone Encounter (Signed)
LMOM for pt to CB.  

## 2014-08-24 ENCOUNTER — Ambulatory Visit: Payer: 59 | Admitting: Family Medicine

## 2014-08-25 ENCOUNTER — Ambulatory Visit (INDEPENDENT_AMBULATORY_CARE_PROVIDER_SITE_OTHER): Payer: 59 | Admitting: Family Medicine

## 2014-08-25 ENCOUNTER — Encounter: Payer: Self-pay | Admitting: Family Medicine

## 2014-08-25 VITALS — BP 124/85 | HR 70 | Ht 62.0 in | Wt 180.0 lb

## 2014-08-25 DIAGNOSIS — M25561 Pain in right knee: Secondary | ICD-10-CM | POA: Diagnosis not present

## 2014-08-25 MED ORDER — DICLOFENAC SODIUM 75 MG PO TBEC: 75.0000 mg | DELAYED_RELEASE_TABLET | Freq: Two times a day (BID) | ORAL | Status: DC

## 2014-08-25 NOTE — Patient Instructions (Signed)
You have a bakers cyst of your knee that is likely from mild arthritis. Take diclofenac twice a day with food for 7-10 days then as needed for pain and inflammation. Capsaicin, aspercreme, or biofreeze topically up to four times a day may also help with pain. Cortisone injections are an option. It's important that you continue to stay active. Straight leg raises, knee extensions, hamstring curls, hamstring swings 3 sets of 10 once a day (add ankle weight if these become too easy). Consider physical therapy to strengthen muscles around the joint that hurts to take pressure off of the joint itself. Heat or ice 15 minutes at a time 3-4 times a day as needed to help with pain. Follow up with me in 1 month.

## 2014-08-28 NOTE — Assessment & Plan Note (Signed)
Patient reports on exam by PCP she had more pain, swelling, warmth, fullness behind right knee than she does currently.  This would suggest a baker's cyst that intermittently swells and shrinks.  Would expect cause of this to be arthritis.  We discussed her options - she would like to start with home exercises, nsaids, topical medications first before considering cortisone injection.  Follow up with me in 1 month or sooner if she would like injection.

## 2014-08-28 NOTE — Progress Notes (Signed)
PCP and referred by: Irene Pap, NP  Subjective:   HPI: Patient is a 49 y.o. female here for right knee pain.  Patient reports she's had off and on right knee pain for about 10 years. Saw a physician back then and told she had bursitis. More recently (past 2 months) pain has been constant, worsening. Pain level up to 8/10 with prolonged walking and standing. Feels like knee wants to give out. No locking or catching. Feels pressure behind her knee. Tried knee sleeve, occasional aleve. Pain currently 2/10. No recent prolonged travel, immobility.  Past Medical History  Diagnosis Date  . Hypertension   . GERD (gastroesophageal reflux disease)   . Dysplastic nevus     beneath r eye    Current Outpatient Prescriptions on File Prior to Visit  Medication Sig Dispense Refill  . cyanocobalamin (,VITAMIN B-12,) 1000 MCG/ML injection Inject 1 mL (1,000 mcg total) into the muscle every 30 (thirty) days. 1 mL 1  . hydrochlorothiazide (HYDRODIURIL) 12.5 MG tablet Take 1 tablet (12.5 mg total) by mouth daily. 90 tablet 1  . phentermine 37.5 MG capsule Take 1 capsule (37.5 mg total) by mouth every morning. 30 capsule 1   No current facility-administered medications on file prior to visit.    No past surgical history on file.  Allergies  Allergen Reactions  . Penicillins     History   Social History  . Marital Status: Married    Spouse Name: N/A  . Number of Children: N/A  . Years of Education: N/A   Occupational History  . Not on file.   Social History Main Topics  . Smoking status: Former Research scientist (life sciences)  . Smokeless tobacco: Never Used     Comment: Quit in 2005  . Alcohol Use: 0.0 oz/week    0 Standard drinks or equivalent per week  . Drug Use: No  . Sexual Activity: Yes    Birth Control/ Protection: Surgical   Other Topics Concern  . Not on file   Social History Narrative    Family History  Problem Relation Age of Onset  . Hyperlipidemia Mother   . Cancer Mother  46    breast  . Heart attack Father   . COPD Father   . Heart attack Sister   . Hyperlipidemia Sister   . Hypertension Sister   . Hyperlipidemia Maternal Aunt   . Cancer Maternal Aunt     breast  . Hypertension Maternal Uncle   . Diabetes Maternal Uncle   . Cancer Maternal Uncle     colon  . Hyperlipidemia Maternal Grandmother   . Diabetes Maternal Grandmother   . Cancer Maternal Grandmother     breast  . Diabetes Maternal Grandfather   . Sudden death Neg Hx     BP 124/85 mmHg  Pulse 70  Ht 5\' 2"  (1.575 m)  Wt 180 lb (81.647 kg)  BMI 32.91 kg/m2  Review of Systems: See HPI above.    Objective:  Physical Exam:  Gen: NAD  Right knee: No gross deformity, ecchymoses, effusion. TTP minimally popliteal fossa.  Mild medial joint line as well.  No other tenderness. FROM. Negative ant/post drawers. Negative valgus/varus testing. Negative lachmanns. Negative mcmurrays, apleys, patellar apprehension. NV intact distally.  MSK u/s:  No bakers cyst visualized today.    Assessment & Plan:  1. Right knee pain - Patient reports on exam by PCP she had more pain, swelling, warmth, fullness behind right knee than she does currently.  This would  suggest a baker's cyst that intermittently swells and shrinks.  Would expect cause of this to be arthritis.  We discussed her options - she would like to start with home exercises, nsaids, topical medications first before considering cortisone injection.  Follow up with me in 1 month or sooner if she would like injection.

## 2014-09-15 ENCOUNTER — Encounter: Payer: Self-pay | Admitting: Family Medicine

## 2014-09-15 ENCOUNTER — Ambulatory Visit (INDEPENDENT_AMBULATORY_CARE_PROVIDER_SITE_OTHER): Payer: 59 | Admitting: Family Medicine

## 2014-09-15 VITALS — BP 137/88 | Ht 62.0 in | Wt 180.0 lb

## 2014-09-15 DIAGNOSIS — M25561 Pain in right knee: Secondary | ICD-10-CM

## 2014-09-15 MED ORDER — METHYLPREDNISOLONE ACETATE 40 MG/ML IJ SUSP
40.0000 mg | Freq: Once | INTRAMUSCULAR | Status: AC
  Administered 2014-09-15: 40 mg via INTRA_ARTICULAR

## 2014-09-15 NOTE — Patient Instructions (Signed)
You have a bakers cyst of your knee that is likely from mild arthritis vs a meniscus tear. Continue the ibuprofen as you have been. Capsaicin, aspercreme, or biofreeze topically up to four times a day may also help with pain. Cortisone injections are an option - you were given this today. It's important that you continue to stay active. Straight leg raises, knee extensions, hamstring curls, hamstring swings 3 sets of 10 once a day (add ankle weight if these become too easy). Consider physical therapy to strengthen muscles around the joint that hurts to take pressure off of the joint itself. Heat or ice 15 minutes at a time 3-4 times a day as needed to help with pain. Follow up with me in 1 month. But call me if you want to do imaging, physical therapy before that if the shot isn't helping enough.

## 2014-09-19 NOTE — Progress Notes (Signed)
PCP and referred by: Irene Pap, NP  Subjective:   HPI: Patient is a 49 y.o. female here for right knee pain.  7/29: Patient reports she's had off and on right knee pain for about 10 years. Saw a physician back then and told she had bursitis. More recently (past 2 months) pain has been constant, worsening. Pain level up to 8/10 with prolonged walking and standing. Feels like knee wants to give out. No locking or catching. Feels pressure behind her knee. Tried knee sleeve, occasional aleve. Pain currently 2/10. No recent prolonged travel, immobility.  8/19: Patient reports knee still bothering her since last visit. Pain level 8/10. Has been doing home exercises, OTC medicines. No catching, locking. Knee does feel like going to give out at times.  Past Medical History  Diagnosis Date  . Hypertension   . GERD (gastroesophageal reflux disease)   . Dysplastic nevus     beneath r eye    Current Outpatient Prescriptions on File Prior to Visit  Medication Sig Dispense Refill  . cyanocobalamin (,VITAMIN B-12,) 1000 MCG/ML injection Inject 1 mL (1,000 mcg total) into the muscle every 30 (thirty) days. 1 mL 1  . diclofenac (VOLTAREN) 75 MG EC tablet Take 1 tablet (75 mg total) by mouth 2 (two) times daily. 60 tablet 1  . hydrochlorothiazide (HYDRODIURIL) 12.5 MG tablet Take 1 tablet (12.5 mg total) by mouth daily. 90 tablet 1  . phentermine 37.5 MG capsule Take 1 capsule (37.5 mg total) by mouth every morning. 30 capsule 1   No current facility-administered medications on file prior to visit.    No past surgical history on file.  Allergies  Allergen Reactions  . Penicillins     Social History   Social History  . Marital Status: Married    Spouse Name: N/A  . Number of Children: N/A  . Years of Education: N/A   Occupational History  . Not on file.   Social History Main Topics  . Smoking status: Former Research scientist (life sciences)  . Smokeless tobacco: Never Used     Comment: Quit  in 2005  . Alcohol Use: 0.0 oz/week    0 Standard drinks or equivalent per week  . Drug Use: No  . Sexual Activity: Yes    Birth Control/ Protection: Surgical   Other Topics Concern  . Not on file   Social History Narrative    Family History  Problem Relation Age of Onset  . Hyperlipidemia Mother   . Cancer Mother 72    breast  . Heart attack Father   . COPD Father   . Heart attack Sister   . Hyperlipidemia Sister   . Hypertension Sister   . Hyperlipidemia Maternal Aunt   . Cancer Maternal Aunt     breast  . Hypertension Maternal Uncle   . Diabetes Maternal Uncle   . Cancer Maternal Uncle     colon  . Hyperlipidemia Maternal Grandmother   . Diabetes Maternal Grandmother   . Cancer Maternal Grandmother     breast  . Diabetes Maternal Grandfather   . Sudden death Neg Hx     BP 137/88 mmHg  Ht 5\' 2"  (1.575 m)  Wt 180 lb (81.647 kg)  BMI 32.91 kg/m2  Review of Systems: See HPI above.    Objective:  Physical Exam:  Gen: NAD  Right knee: No gross deformity, ecchymoses, effusion. TTP minimally popliteal fossa.  Mild medial joint line as well.  No other tenderness. FROM. Negative ant/post drawers. Negative  valgus/varus testing. Negative lachmanns. Negative mcmurrays, apleys, patellar apprehension. NV intact distally.    Assessment & Plan:  1. Right knee pain - 2/2 bakers cyst, DJD vs meniscus tear.  Continue ibuprofen, topical medications.  Intraarticular injection given today.  Continue HEP.  Consider PT, further imaging if not improving.  F/u in 1 month.  After informed written consent, patient was seated on exam table. Right knee was prepped with alcohol swab and utilizing anteromedial approach, patient's right knee was injected intraarticularly with 3:1 marcaine: depomedrol. Patient tolerated the procedure well without immediate complications.

## 2014-09-19 NOTE — Assessment & Plan Note (Signed)
2/2 bakers cyst, DJD vs meniscus tear.  Continue ibuprofen, topical medications.  Intraarticular injection given today.  Continue HEP.  Consider PT, further imaging if not improving.  F/u in 1 month.  After informed written consent, patient was seated on exam table. Right knee was prepped with alcohol swab and utilizing anteromedial approach, patient's right knee was injected intraarticularly with 3:1 marcaine: depomedrol. Patient tolerated the procedure well without immediate complications.

## 2014-09-20 ENCOUNTER — Other Ambulatory Visit: Payer: Self-pay | Admitting: Family Medicine

## 2014-09-20 ENCOUNTER — Ambulatory Visit (HOSPITAL_BASED_OUTPATIENT_CLINIC_OR_DEPARTMENT_OTHER)
Admission: RE | Admit: 2014-09-20 | Discharge: 2014-09-20 | Disposition: A | Discharge status: Home / Self Care | Payer: 59 | Source: Ambulatory Visit | Attending: Family Medicine | Admitting: Family Medicine

## 2014-09-20 ENCOUNTER — Ambulatory Visit (INDEPENDENT_AMBULATORY_CARE_PROVIDER_SITE_OTHER): Payer: 59 | Admitting: Family Medicine

## 2014-09-20 VITALS — BP 142/90 | HR 71 | Ht 62.0 in | Wt 179.0 lb

## 2014-09-20 DIAGNOSIS — M25561 Pain in right knee: Secondary | ICD-10-CM | POA: Insufficient documentation

## 2014-09-21 ENCOUNTER — Encounter: Payer: Self-pay | Admitting: Family Medicine

## 2014-09-21 ENCOUNTER — Ambulatory Visit: Payer: 59 | Admitting: Family Medicine

## 2014-09-25 ENCOUNTER — Ambulatory Visit (INDEPENDENT_AMBULATORY_CARE_PROVIDER_SITE_OTHER): Payer: 59

## 2014-09-25 ENCOUNTER — Ambulatory Visit: Payer: 59 | Admitting: Family Medicine

## 2014-09-25 DIAGNOSIS — X58XXXA Exposure to other specified factors, initial encounter: Secondary | ICD-10-CM

## 2014-09-25 DIAGNOSIS — M25561 Pain in right knee: Secondary | ICD-10-CM

## 2014-09-25 DIAGNOSIS — S8331XA Tear of articular cartilage of right knee, current, initial encounter: Secondary | ICD-10-CM | POA: Diagnosis not present

## 2014-09-26 ENCOUNTER — Encounter: Payer: Self-pay | Admitting: Family Medicine

## 2014-09-26 NOTE — Assessment & Plan Note (Signed)
Not improving with injection, nsaids, topical medications, home exercises.  Radiographs with only mild DJD.  Will go ahead with MRI to assess for meniscus tear.

## 2014-09-26 NOTE — Progress Notes (Addendum)
PCP and referred by: Irene Pap, NP  Subjective:   HPI: Patient is a 49 y.o. female here for right knee pain.  7/29: Patient reports she's had off and on right knee pain for about 10 years. Saw a physician back then and told she had bursitis. More recently (past 2 months) pain has been constant, worsening. Pain level up to 8/10 with prolonged walking and standing. Feels like knee wants to give out. No locking or catching. Feels pressure behind her knee. Tried knee sleeve, occasional aleve. Pain currently 2/10. No recent prolonged travel, immobility.  8/19: Patient reports knee still bothering her since last visit. Pain level 8/10. Has been doing home exercises, OTC medicines. No catching, locking. Knee does feel like going to give out at times.  8/24: Patient reports not having improvement since injection. More popping posteriorly. Using heat/ice. Difficulty putting weight on leg. Feels like going to lock, give out. Pain 5/10 though gets worse than this at times also.  Past Medical History  Diagnosis Date  . Hypertension   . GERD (gastroesophageal reflux disease)   . Dysplastic nevus     beneath r eye  . Squamous cell carcinoma, face 02/24/2014    Current Outpatient Prescriptions on File Prior to Visit  Medication Sig Dispense Refill  . cyanocobalamin (,VITAMIN B-12,) 1000 MCG/ML injection Inject 1 mL (1,000 mcg total) into the muscle every 30 (thirty) days. 1 mL 1  . diclofenac (VOLTAREN) 75 MG EC tablet Take 1 tablet (75 mg total) by mouth 2 (two) times daily. 60 tablet 1  . hydrochlorothiazide (HYDRODIURIL) 12.5 MG tablet Take 1 tablet (12.5 mg total) by mouth daily. 90 tablet 1  . phentermine 37.5 MG capsule Take 1 capsule (37.5 mg total) by mouth every morning. 30 capsule 1   No current facility-administered medications on file prior to visit.    No past surgical history on file.  Allergies  Allergen Reactions  . Penicillins     Social History    Social History  . Marital Status: Married    Spouse Name: N/A  . Number of Children: N/A  . Years of Education: N/A   Occupational History  . Not on file.   Social History Main Topics  . Smoking status: Former Research scientist (life sciences)  . Smokeless tobacco: Never Used     Comment: Quit in 2005  . Alcohol Use: 0.0 oz/week    0 Standard drinks or equivalent per week  . Drug Use: No  . Sexual Activity: Yes    Birth Control/ Protection: Surgical   Other Topics Concern  . Not on file   Social History Narrative    Family History  Problem Relation Age of Onset  . Hyperlipidemia Mother   . Cancer Mother 33    breast  . Heart attack Father   . COPD Father   . Heart attack Sister   . Hyperlipidemia Sister   . Hypertension Sister   . Hyperlipidemia Maternal Aunt   . Cancer Maternal Aunt     breast  . Hypertension Maternal Uncle   . Diabetes Maternal Uncle   . Cancer Maternal Uncle     colon  . Hyperlipidemia Maternal Grandmother   . Diabetes Maternal Grandmother   . Cancer Maternal Grandmother     breast  . Diabetes Maternal Grandfather   . Sudden death Neg Hx     BP 142/90 mmHg  Pulse 71  Ht 5\' 2"  (1.575 m)  Wt 179 lb (81.194 kg)  BMI 32.73  kg/m2  Review of Systems: See HPI above.    Objective:  Physical Exam:  Gen: NAD  Right knee: No gross deformity, ecchymoses, effusion. TTP medial joint line and minimally popliteal fossa.  No other tenderness. FROM. Negative ant/post drawers. Negative valgus/varus testing. Negative lachmanns. Pain with mcmurrays, negative apleys.  Negative patellar apprehension. NV intact distally.    Assessment & Plan:  1. Right knee pain - Not improving with injection, nsaids, topical medications, home exercises.  Radiographs with only mild DJD.  Will go ahead with MRI to assess for meniscus tear.  Addendum:  MRI reviewed - patient does have a complex medial meniscus tear.  Tried to contact by phone but no answer and mailbox is full.  Sent  mychart message with results, recommended referral to discuss arthroscopy.  Awaiting return message.

## 2014-09-27 NOTE — Addendum Note (Signed)
Addended by: Sherrie George F on: 09/27/2014 02:02 PM   Modules accepted: Orders

## 2014-10-09 ENCOUNTER — Encounter (HOSPITAL_BASED_OUTPATIENT_CLINIC_OR_DEPARTMENT_OTHER): Payer: Self-pay | Admitting: *Deleted

## 2014-10-10 ENCOUNTER — Other Ambulatory Visit: Payer: Self-pay | Admitting: Orthopedic Surgery

## 2014-10-12 ENCOUNTER — Other Ambulatory Visit: Payer: Self-pay

## 2014-10-12 ENCOUNTER — Encounter (HOSPITAL_BASED_OUTPATIENT_CLINIC_OR_DEPARTMENT_OTHER)
Admission: RE | Admit: 2014-10-12 | Discharge: 2014-10-12 | Disposition: A | Discharge status: Home / Self Care | Payer: 59 | Source: Ambulatory Visit | Attending: Orthopedic Surgery | Admitting: Orthopedic Surgery

## 2014-10-12 DIAGNOSIS — Z791 Long term (current) use of non-steroidal anti-inflammatories (NSAID): Secondary | ICD-10-CM | POA: Diagnosis not present

## 2014-10-12 DIAGNOSIS — I10 Essential (primary) hypertension: Secondary | ICD-10-CM | POA: Diagnosis not present

## 2014-10-12 DIAGNOSIS — X58XXXA Exposure to other specified factors, initial encounter: Secondary | ICD-10-CM | POA: Diagnosis not present

## 2014-10-12 DIAGNOSIS — S83241A Other tear of medial meniscus, current injury, right knee, initial encounter: Secondary | ICD-10-CM | POA: Diagnosis present

## 2014-10-12 DIAGNOSIS — K219 Gastro-esophageal reflux disease without esophagitis: Secondary | ICD-10-CM | POA: Diagnosis not present

## 2014-10-12 DIAGNOSIS — M179 Osteoarthritis of knee, unspecified: Secondary | ICD-10-CM | POA: Diagnosis not present

## 2014-10-12 DIAGNOSIS — Z87891 Personal history of nicotine dependence: Secondary | ICD-10-CM | POA: Diagnosis not present

## 2014-10-12 DIAGNOSIS — Z79899 Other long term (current) drug therapy: Secondary | ICD-10-CM | POA: Diagnosis not present

## 2014-10-12 LAB — BASIC METABOLIC PANEL
ANION GAP: 5 (ref 5–15)
BUN: 10 mg/dL (ref 6–20)
CHLORIDE: 106 mmol/L (ref 101–111)
CO2: 26 mmol/L (ref 22–32)
Calcium: 9.3 mg/dL (ref 8.9–10.3)
Creatinine, Ser: 0.78 mg/dL (ref 0.44–1.00)
GFR calc non Af Amer: >60 mL/min (ref >60)
Glucose, Bld: 89 mg/dL (ref 65–99)
POTASSIUM: 4.1 mmol/L (ref 3.5–5.1)
SODIUM: 137 mmol/L (ref 135–145)

## 2014-10-12 NOTE — Progress Notes (Signed)
EKG reviewed by Dr R. Fitzgerald and cleared

## 2014-10-13 ENCOUNTER — Ambulatory Visit (HOSPITAL_BASED_OUTPATIENT_CLINIC_OR_DEPARTMENT_OTHER): Payer: 59 | Admitting: Anesthesiology

## 2014-10-13 ENCOUNTER — Encounter (HOSPITAL_BASED_OUTPATIENT_CLINIC_OR_DEPARTMENT_OTHER): Payer: Self-pay | Admitting: *Deleted

## 2014-10-13 ENCOUNTER — Encounter (HOSPITAL_BASED_OUTPATIENT_CLINIC_OR_DEPARTMENT_OTHER)
Admission: RE | Disposition: A | Discharge status: Home / Self Care | Payer: Self-pay | Source: Ambulatory Visit | Attending: Orthopedic Surgery

## 2014-10-13 ENCOUNTER — Ambulatory Visit (HOSPITAL_BASED_OUTPATIENT_CLINIC_OR_DEPARTMENT_OTHER)
Admission: RE | Admit: 2014-10-13 | Discharge: 2014-10-13 | Disposition: A | Discharge status: Home / Self Care | Payer: 59 | Source: Ambulatory Visit | Attending: Orthopedic Surgery | Admitting: Orthopedic Surgery

## 2014-10-13 DIAGNOSIS — Z791 Long term (current) use of non-steroidal anti-inflammatories (NSAID): Secondary | ICD-10-CM | POA: Insufficient documentation

## 2014-10-13 DIAGNOSIS — Z79899 Other long term (current) drug therapy: Secondary | ICD-10-CM | POA: Insufficient documentation

## 2014-10-13 DIAGNOSIS — Z87891 Personal history of nicotine dependence: Secondary | ICD-10-CM | POA: Insufficient documentation

## 2014-10-13 DIAGNOSIS — K219 Gastro-esophageal reflux disease without esophagitis: Secondary | ICD-10-CM | POA: Insufficient documentation

## 2014-10-13 DIAGNOSIS — S83241A Other tear of medial meniscus, current injury, right knee, initial encounter: Secondary | ICD-10-CM | POA: Insufficient documentation

## 2014-10-13 DIAGNOSIS — M1711 Unilateral primary osteoarthritis, right knee: Secondary | ICD-10-CM | POA: Diagnosis present

## 2014-10-13 DIAGNOSIS — I10 Essential (primary) hypertension: Secondary | ICD-10-CM | POA: Insufficient documentation

## 2014-10-13 DIAGNOSIS — M179 Osteoarthritis of knee, unspecified: Secondary | ICD-10-CM | POA: Insufficient documentation

## 2014-10-13 DIAGNOSIS — X58XXXA Exposure to other specified factors, initial encounter: Secondary | ICD-10-CM | POA: Insufficient documentation

## 2014-10-13 HISTORY — DX: Unilateral primary osteoarthritis, right knee: M17.11

## 2014-10-13 HISTORY — PX: KNEE ARTHROSCOPY WITH MEDIAL MENISECTOMY: SHX5651

## 2014-10-13 HISTORY — DX: Other tear of medial meniscus, current injury, right knee, initial encounter: S83.241A

## 2014-10-13 HISTORY — DX: Unspecified osteoarthritis, unspecified site: M19.90

## 2014-10-13 SURGERY — ARTHROSCOPY, KNEE, WITH MEDIAL MENISCECTOMY
Anesthesia: General | Site: Knee | Laterality: Right

## 2014-10-13 MED ORDER — OXYCODONE-ACETAMINOPHEN 5-325 MG PO TABS
1.0000 | ORAL_TABLET | Freq: Four times a day (QID) | ORAL | Status: DC | PRN
Start: 2014-10-13 — End: 2015-03-22

## 2014-10-13 MED ORDER — ONDANSETRON HCL 4 MG/2ML IJ SOLN
INTRAMUSCULAR | Status: DC | PRN
  Administered 2014-10-13: 4 mg via INTRAVENOUS

## 2014-10-13 MED ORDER — BUPIVACAINE HCL (PF) 0.5 % IJ SOLN
INTRAMUSCULAR | Status: AC
  Filled 2014-10-13: qty 30

## 2014-10-13 MED ORDER — BUPIVACAINE-EPINEPHRINE (PF) 0.5% -1:200000 IJ SOLN
INTRAMUSCULAR | Status: AC
  Filled 2014-10-13: qty 30

## 2014-10-13 MED ORDER — SODIUM CHLORIDE 0.9 % IR SOLN
Status: DC | PRN
  Administered 2014-10-13: 3000 mL

## 2014-10-13 MED ORDER — LIDOCAINE HCL (CARDIAC) 20 MG/ML IV SOLN
INTRAVENOUS | Status: DC | PRN
  Administered 2014-10-13: 60 mg via INTRAVENOUS

## 2014-10-13 MED ORDER — SCOPOLAMINE 1 MG/3DAYS TD PT72: 1.0000 | MEDICATED_PATCH | Freq: Once | TRANSDERMAL | Status: DC | PRN

## 2014-10-13 MED ORDER — CEFAZOLIN SODIUM-DEXTROSE 2-3 GM-% IV SOLR
2.0000 g | INTRAVENOUS | Status: AC
  Administered 2014-10-13: 2 g via INTRAVENOUS

## 2014-10-13 MED ORDER — BUPIVACAINE-EPINEPHRINE 0.5% -1:200000 IJ SOLN
INTRAMUSCULAR | Status: DC | PRN
  Administered 2014-10-13: 20 mL

## 2014-10-13 MED ORDER — FENTANYL CITRATE (PF) 100 MCG/2ML IJ SOLN
INTRAMUSCULAR | Status: AC
  Filled 2014-10-13: qty 4

## 2014-10-13 MED ORDER — ONDANSETRON HCL 4 MG/2ML IJ SOLN
INTRAMUSCULAR | Status: AC
  Filled 2014-10-13: qty 2

## 2014-10-13 MED ORDER — FENTANYL CITRATE (PF) 100 MCG/2ML IJ SOLN
50.0000 ug | INTRAMUSCULAR | Status: DC | PRN
  Administered 2014-10-13: 100 ug via INTRAVENOUS

## 2014-10-13 MED ORDER — CEFAZOLIN SODIUM-DEXTROSE 2-3 GM-% IV SOLR
INTRAVENOUS | Status: AC
  Filled 2014-10-13: qty 50

## 2014-10-13 MED ORDER — MIDAZOLAM HCL 2 MG/2ML IJ SOLN
INTRAMUSCULAR | Status: AC
  Filled 2014-10-13: qty 4

## 2014-10-13 MED ORDER — HYDROMORPHONE HCL 1 MG/ML IJ SOLN
0.2500 mg | INTRAMUSCULAR | Status: DC | PRN
  Administered 2014-10-13 (×2): 0.5 mg via INTRAVENOUS

## 2014-10-13 MED ORDER — GLYCOPYRROLATE 0.2 MG/ML IJ SOLN: 0.2000 mg | Freq: Once | INTRAMUSCULAR | Status: DC | PRN

## 2014-10-13 MED ORDER — PROPOFOL 500 MG/50ML IV EMUL
INTRAVENOUS | Status: AC
  Filled 2014-10-13: qty 50

## 2014-10-13 MED ORDER — SENNA-DOCUSATE SODIUM 8.6-50 MG PO TABS: 2.0000 | ORAL_TABLET | Freq: Every day | ORAL | Status: DC

## 2014-10-13 MED ORDER — DEXAMETHASONE SODIUM PHOSPHATE 4 MG/ML IJ SOLN
INTRAMUSCULAR | Status: DC | PRN
  Administered 2014-10-13: 10 mg via INTRAVENOUS

## 2014-10-13 MED ORDER — HYDROMORPHONE HCL 1 MG/ML IJ SOLN
INTRAMUSCULAR | Status: AC
  Filled 2014-10-13: qty 1

## 2014-10-13 MED ORDER — ONDANSETRON HCL 4 MG PO TABS: 4.0000 mg | ORAL_TABLET | Freq: Three times a day (TID) | ORAL | Status: DC | PRN

## 2014-10-13 MED ORDER — MIDAZOLAM HCL 2 MG/2ML IJ SOLN
1.0000 mg | INTRAMUSCULAR | Status: DC | PRN
  Administered 2014-10-13: 2 mg via INTRAVENOUS

## 2014-10-13 MED ORDER — MIDAZOLAM HCL 2 MG/ML PO SYRP: 0.5000 mg/kg | ORAL_SOLUTION | Freq: Once | ORAL | Status: DC

## 2014-10-13 MED ORDER — LACTATED RINGERS IV SOLN
INTRAVENOUS | Status: DC
  Administered 2014-10-13: 07:00:00 via INTRAVENOUS

## 2014-10-13 MED ORDER — PROPOFOL 10 MG/ML IV BOLUS
INTRAVENOUS | Status: DC | PRN
  Administered 2014-10-13: 150 mg via INTRAVENOUS

## 2014-10-13 SURGICAL SUPPLY — 39 items
BANDAGE ELASTIC 6 VELCRO ST LF (GAUZE/BANDAGES/DRESSINGS) ×2 IMPLANT
BANDAGE ESMARK 6X9 LF (GAUZE/BANDAGES/DRESSINGS) IMPLANT
BLADE CUTTER GATOR 3.5 (BLADE) ×2 IMPLANT
BNDG CMPR 9X6 STRL LF SNTH (GAUZE/BANDAGES/DRESSINGS)
BNDG ESMARK 6X9 LF (GAUZE/BANDAGES/DRESSINGS)
CLSR STERI-STRIP ANTIMIC 1/2X4 (GAUZE/BANDAGES/DRESSINGS) ×2 IMPLANT
CUFF TOURNIQUET SINGLE 34IN LL (TOURNIQUET CUFF) IMPLANT
CUTTER KNOT PUSHER 2-0 FIBERWI (INSTRUMENTS) IMPLANT
CUTTER MENISCUS  4.2MM (BLADE)
CUTTER MENISCUS 4.2MM (BLADE) IMPLANT
DRAPE ARTHROSCOPY W/POUCH 90 (DRAPES) ×2 IMPLANT
DRAPE U 20/CS (DRAPES) ×2 IMPLANT
DURAPREP 26ML APPLICATOR (WOUND CARE) ×2 IMPLANT
ELECT MENISCUS 165MM 90D (ELECTRODE) IMPLANT
ELECT REM PT RETURN 9FT ADLT (ELECTROSURGICAL)
ELECTRODE REM PT RTRN 9FT ADLT (ELECTROSURGICAL) IMPLANT
GAUZE SPONGE 4X4 12PLY STRL (GAUZE/BANDAGES/DRESSINGS) ×2 IMPLANT
GLOVE BIO SURGEON STRL SZ8 (GLOVE) ×2 IMPLANT
GLOVE BIOGEL PI IND STRL 8 (GLOVE) ×2 IMPLANT
GLOVE BIOGEL PI INDICATOR 8 (GLOVE) ×3
GLOVE ORTHO TXT STRL SZ7.5 (GLOVE) ×3 IMPLANT
GOWN STRL REUS W/ TWL LRG LVL3 (GOWN DISPOSABLE) ×1 IMPLANT
GOWN STRL REUS W/ TWL XL LVL3 (GOWN DISPOSABLE) ×2 IMPLANT
GOWN STRL REUS W/TWL LRG LVL3 (GOWN DISPOSABLE)
GOWN STRL REUS W/TWL XL LVL3 (GOWN DISPOSABLE) ×6
IV NS IRRIG 3000ML ARTHROMATIC (IV SOLUTION) ×3 IMPLANT
KNEE WRAP E Z 3 GEL PACK (MISCELLANEOUS) ×2 IMPLANT
MANIFOLD NEPTUNE II (INSTRUMENTS) ×2 IMPLANT
PACK ARTHROSCOPY DSU (CUSTOM PROCEDURE TRAY) ×2 IMPLANT
PACK BASIN DAY SURGERY FS (CUSTOM PROCEDURE TRAY) ×2 IMPLANT
PENCIL BUTTON HOLSTER BLD 10FT (ELECTRODE) IMPLANT
SET ARTHROSCOPY TUBING (MISCELLANEOUS) ×2
SET ARTHROSCOPY TUBING LN (MISCELLANEOUS) ×1 IMPLANT
SLEEVE SCD COMPRESS KNEE MED (MISCELLANEOUS) ×1 IMPLANT
SUT MNCRL AB 4-0 PS2 18 (SUTURE) ×2 IMPLANT
TOWEL OR 17X24 6PK STRL BLUE (TOWEL DISPOSABLE) ×2 IMPLANT
TOWEL OR NON WOVEN STRL DISP B (DISPOSABLE) ×2 IMPLANT
WAND STAR VAC 90 (SURGICAL WAND) IMPLANT
WATER STERILE IRR 1000ML POUR (IV SOLUTION) ×2 IMPLANT

## 2014-10-13 NOTE — H&P (Signed)
PREOPERATIVE H&P  Chief Complaint: OTHER TEAR OF MEDIAL MENISCUS, CURRENT INJURY, RIGHT KNEE  HPI: Stephanie Castro is a 49 y.o. female who presents for preoperative history and physical with a diagnosis of right medial meniscus tear with early degenerative findings. Symptoms are rated as moderate to severe, and have been worsening.  This is significantly impairing activities of daily living.  She has elected for surgical management. Pain is worse with squatting and bending.  Present for at least 1 month.  6/10.  Failed injections and nsaids.  Past Medical History  Diagnosis Date  . Hypertension   . GERD (gastroesophageal reflux disease)   . Dysplastic nevus     beneath r eye  . Squamous cell carcinoma, face 02/24/2014  . Arthritis     both knees , back and elbows   Past Surgical History  Procedure Laterality Date  . Abdominal hysterectomy     Social History   Social History  . Marital Status: Married    Spouse Name: N/A  . Number of Children: N/A  . Years of Education: N/A   Social History Main Topics  . Smoking status: Former Research scientist (life sciences)  . Smokeless tobacco: Never Used     Comment: Quit in 2005  . Alcohol Use: 0.0 oz/week    0 Standard drinks or equivalent per week     Comment: social  . Drug Use: No  . Sexual Activity: Yes    Birth Control/ Protection: Surgical   Other Topics Concern  . None   Social History Narrative   Family History  Problem Relation Age of Onset  . Hyperlipidemia Mother   . Cancer Mother 3    breast  . Heart attack Father   . COPD Father   . Heart attack Sister   . Hyperlipidemia Sister   . Hypertension Sister   . Hyperlipidemia Maternal Aunt   . Cancer Maternal Aunt     breast  . Hypertension Maternal Uncle   . Diabetes Maternal Uncle   . Cancer Maternal Uncle     colon  . Hyperlipidemia Maternal Grandmother   . Diabetes Maternal Grandmother   . Cancer Maternal Grandmother     breast  . Diabetes Maternal Grandfather   . Sudden death  Neg Hx    Allergies  Allergen Reactions  . Penicillins    Prior to Admission medications   Medication Sig Start Date End Date Taking? Authorizing Provider  hydrochlorothiazide (HYDRODIURIL) 12.5 MG tablet Take 1 tablet (12.5 mg total) by mouth daily. 08/21/14  Yes Irene Pap, NP  phentermine 37.5 MG capsule Take 1 capsule (37.5 mg total) by mouth every morning. 08/21/14  Yes Irene Pap, NP  cyanocobalamin (,VITAMIN B-12,) 1000 MCG/ML injection Inject 1 mL (1,000 mcg total) into the muscle every 30 (thirty) days. 06/28/14   Irene Pap, NP  diclofenac (VOLTAREN) 75 MG EC tablet Take 1 tablet (75 mg total) by mouth 2 (two) times daily. 08/25/14   Dene Gentry, MD  omeprazole (PRILOSEC) 40 MG capsule Take 40 mg by mouth daily.    Historical Provider, MD     Positive ROS: All other systems have been reviewed and were otherwise negative with the exception of those mentioned in the HPI and as above.  Physical Exam: General: Alert, no acute distress Cardiovascular: No pedal edema Respiratory: No cyanosis, no use of accessory musculature GI: No organomegaly, abdomen is soft and non-tender Skin: No lesions in the area of chief complaint Neurologic: Sensation intact distally  Psychiatric: Patient is competent for consent with normal mood and affect Lymphatic: No axillary or cervical lymphadenopathy  MUSCULOSKELETAL: right knee with medial joint line Tenderness, positive McMurray's, negative Lachman, range of motion 0-130.  MRI demonstrates complex tear of the medial meniscus with a radial component and meniscal extrusion. There is partial-thickness chondral loss on the medial side.  Assessment: Right knee medial meniscus tear with early anteromedial arthritis.   Plan: Plan for Procedure(s): RIGHT KNEE ARTHROSCOPY WITH MEDIAL MENISECTOMY  The risks benefits and alternatives were discussed with the patient including but not limited to the risks of nonoperative treatment, versus  surgical intervention including infection, bleeding, nerve injury,  blood clots, cardiopulmonary complications, morbidity, mortality, among others, and they were willing to proceed. We have also discussed the risks for progression of arthritis and the potential need for partial knee replacement. She has clearly indicated that she does not feel ready to undergo any type of arthroplasty and wishes to have an arthroscopic debridement in order to optimize symptoms.  Johnny Bridge, MD Cell [phone]   10/13/2014 7:22 AM

## 2014-10-13 NOTE — Op Note (Signed)
10/13/2014  8:18 AM  PATIENT:  Stephanie Castro    PRE-OPERATIVE DIAGNOSIS: Right knee posterior horn radial meniscus tear with anteromedial osteoarthritis  POST-OPERATIVE DIAGNOSIS:  Same  PROCEDURE:  RIGHT KNEE ARTHROSCOPY WITH PARTIAL MEDIAL MENISECTOMY AND CHONDROPLASTY   SURGEON:  Johnny Bridge, MD  PHYSICIAN ASSISTANT: Joya Gaskins, OPA-C, present and scrubbed throughout the case, critical for completion in a timely fashion, and for retraction, instrumentation, and closure.  ANESTHESIA:   General  PREOPERATIVE INDICATIONS:  Stephanie Castro is a  49 y.o. female Who had a right knee medial meniscus tear with acute pain and it been present for about 2 months and failed conservative measures and elected for surgical management. Preoperative MRI demonstrated some chondral changes on the medial side, as well as a radial tear of the posterior horn medial meniscus. We discussed the options of partial knee replacement versus knee arthroscopy and she clearly indicated that she wished to proceed with arthroscopic intervention and try and avoid arthroplasty if at all possible.  The risks benefits and alternatives were discussed with the patient preoperatively including but not limited to the risks of infection, bleeding, nerve injury, cardiopulmonary complications, the need for revision surgery, among others, and the patient was willing to proceed.  OPERATIVE IMPLANTS: None  OPERATIVE FINDINGS: The knee had basically full motion during examination under anesthesia. There was a moderate amount of grade 2 chondral changes of the patella on the medial facet. The central trochlea and lateral side of the patella were normal. The lateral compartment and anterior cruciate ligament and PCL were normal. Medial compartment had an area of a noncontained grade 4 chondral changes on the femoral trochlea with a matching kissing lesion on the tibia. These were not amenable to microfracture. There was a radial tear  in the medial meniscus with extrusion. This was in the posterior horn.  OPERATIVE PROCEDURE: The patient is brought to the operating room and placed in the supine position. Gen. Anesthesia was administered. IV antibiotics were given. The right lower extremity was prepped and draped in usual sterile fashion. Time out performed. Diagnostic arthroscopy was carried out the above-named findings. The arthroscopic shaver was used to debride the undersurface of the patella along the medial facet, and I also performed a chondroplasty of the medial femoral condyle as well as the tibial condyle. The radial tear in the posterior horn was identified, anteriorly the meniscus was all intact. I used an arthroscopic basket to debride the medial meniscus back to a stable configuration, and then used the shaver to smoothen out the edges.  I do have significant concern that much of her pain may be secondary to her degenerative changes, and we will have to see whether or not she responds to the arthroscopic intervention as partial knee replacement may be indicated in the future.  She tolerated the procedure well and the wounds were closed with Monocryl followed by Steri-Strips and sterile gauze.

## 2014-10-13 NOTE — Transfer of Care (Signed)
Immediate Anesthesia Transfer of Care Note  Patient: Stephanie Castro  Procedure(s) Performed: Procedure(s): RIGHT KNEE ARTHROSCOPY WITH PARTIAL MEDIAL MENISECTOMY AND CHONDROPLASTY  (Right)  Patient Location: PACU  Anesthesia Type:General  Level of Consciousness: sedated  Airway & Oxygen Therapy: Patient Spontanous Breathing and Patient connected to face mask oxygen  Post-op Assessment: Report given to RN and Post -op Vital signs reviewed and stable  Post vital signs: Reviewed and stable  Last Vitals:  Filed Vitals:   10/13/14 0711  BP:   Pulse: 87  Temp:   Resp: 20    Complications: No apparent anesthesia complications

## 2014-10-13 NOTE — Discharge Instructions (Signed)
Diet: As you were doing prior to hospitalization  ° °Shower:  May shower but keep the wounds dry, use an occlusive plastic wrap, NO SOAKING IN TUB.  If the bandage gets wet, change with a clean dry gauze.  If you have a splint on, leave the splint in place and keep the splint dry with a plastic bag. ° °Dressing:  You may change your dressing 3-5 days after surgery, unless you have a splint.  If you have a splint, then just leave the splint in place and we will change your bandages during your first follow-up appointment.   ° °If you had hand or foot surgery, we will plan to remove your stitches in about 2 weeks in the office.  For all other surgeries, there are sticky tapes (steri-strips) on your wounds and all the stitches are absorbable.  Leave the steri-strips in place when changing your dressings, they will peel off with time, usually 2-3 weeks. ° °Activity:  Increase activity slowly as tolerated, but follow the weight bearing instructions below.  The rules on driving is that you can not be taking narcotics while you drive, and you must feel in control of the vehicle.   ° °Weight Bearing:   As tolerated.   ° °To prevent constipation: you may use a stool softener such as - ° °Colace (over the counter) 100 mg by mouth twice a day  °Drink plenty of fluids (prune juice may be helpful) and high fiber foods °Miralax (over the counter) for constipation as needed.   ° °Itching:  If you experience itching with your medications, try taking only a single pain pill, or even half a pain pill at a time.  You may take up to 10 pain pills per day, and you can also use benadryl over the counter for itching or also to help with sleep.  ° °Precautions:  If you experience chest pain or shortness of breath - call 911 immediately for transfer to the hospital emergency department!! ° °If you develop a fever greater that 101 F, purulent drainage from wound, increased redness or drainage from wound, or calf pain -- Call the office at  [phone]                                                °Follow- Up Appointment:  Please call for an appointment to be seen in 2 weeks [place] - [phone] ° ° ° ° ° °Post Anesthesia Home Care Instructions ° °Activity: °Get plenty of rest for the remainder of the day. A responsible adult should stay with you for 24 hours following the procedure.  °For the next 24 hours, DO NOT: °-Drive a car °-Operate machinery °-Drink alcoholic beverages °-Take any medication unless instructed by your physician °-Make any legal decisions or sign important papers. ° °Meals: °Start with liquid foods such as gelatin or soup. Progress to regular foods as tolerated. Avoid greasy, spicy, heavy foods. If nausea and/or vomiting occur, drink only clear liquids until the nausea and/or vomiting subsides. Call your physician if vomiting continues. ° °Special Instructions/Symptoms: °Your throat may feel dry or sore from the anesthesia or the breathing tube placed in your throat during surgery. If this causes discomfort, gargle with warm salt water. The discomfort should disappear within 24 hours. ° °If you had a scopolamine patch placed behind your ear for the management   of post- operative nausea and/or vomiting: ° °1. The medication in the patch is effective for 72 hours, after which it should be removed.  Wrap patch in a tissue and discard in the trash. Wash hands thoroughly with soap and water. °2. You may remove the patch earlier than 72 hours if you experience unpleasant side effects which may include dry mouth, dizziness or visual disturbances. °3. Avoid touching the patch. Wash your hands with soap and water after contact with the patch. °  ° ° °

## 2014-10-13 NOTE — Anesthesia Postprocedure Evaluation (Signed)
  Anesthesia Post-op Note  Patient: Stephanie Castro  Procedure(s) Performed: Procedure(s): RIGHT KNEE ARTHROSCOPY WITH PARTIAL MEDIAL MENISECTOMY AND CHONDROPLASTY  (Right)  Patient Location: PACU  Anesthesia Type:General  Level of Consciousness: awake and alert   Airway and Oxygen Therapy: Patient Spontanous Breathing  Post-op Pain: Controlled  Post-op Assessment: Post-op Vital signs reviewed, Patient's Cardiovascular Status Stable and Respiratory Function Stable  Post-op Vital Signs: Reviewed  Filed Vitals:   10/13/14 0915  BP: 121/62  Pulse: 57  Temp:   Resp: 16    Complications: No apparent anesthesia complications

## 2014-10-13 NOTE — Anesthesia Procedure Notes (Signed)
Procedure Name: LMA Insertion Date/Time: 10/13/2014 7:34 AM Performed by: Maryella Shivers Pre-anesthesia Checklist: Patient identified, Emergency Drugs available, Suction available and Patient being monitored Patient Re-evaluated:Patient Re-evaluated prior to inductionOxygen Delivery Method: Circle System Utilized Preoxygenation: Pre-oxygenation with 100% oxygen Intubation Type: IV induction Ventilation: Mask ventilation without difficulty LMA: LMA inserted LMA Size: 4.0 Number of attempts: 1 Airway Equipment and Method: bite block Placement Confirmation: positive ETCO2 Tube secured with: Tape Dental Injury: Teeth and Oropharynx as per pre-operative assessment

## 2014-10-13 NOTE — Anesthesia Preprocedure Evaluation (Addendum)
Anesthesia Evaluation  Patient identified by MRN, date of birth, ID band Patient awake    Reviewed: Allergy & Precautions, H&P , NPO status , Patient's Chart, lab work & pertinent test results  History of Anesthesia Complications Negative for: history of anesthetic complications  Airway Mallampati: I  TM Distance: >3 FB Neck ROM: Full    Dental no notable dental hx. (+) Teeth Intact, Dental Advisory Given   Pulmonary neg pulmonary ROS, former smoker,    Pulmonary exam normal breath sounds clear to auscultation       Cardiovascular hypertension, Pt. on medications  Rhythm:Regular Rate:Normal     Neuro/Psych negative neurological ROS  negative psych ROS   GI/Hepatic Neg liver ROS, GERD  Medicated and Controlled,  Endo/Other  negative endocrine ROS  Renal/GU negative Renal ROS  negative genitourinary   Musculoskeletal  (+) Arthritis , Osteoarthritis,    Abdominal   Peds  Hematology negative hematology ROS (+)   Anesthesia Other Findings   Reproductive/Obstetrics negative OB ROS                           Anesthesia Physical Anesthesia Plan  ASA: II  Anesthesia Plan: General   Post-op Pain Management:    Induction: Intravenous  Airway Management Planned: LMA  Additional Equipment:   Intra-op Plan:   Post-operative Plan: Extubation in OR  Informed Consent: I have reviewed the patients History and Physical, chart, labs and discussed the procedure including the risks, benefits and alternatives for the proposed anesthesia with the patient or authorized representative who has indicated his/her understanding and acceptance.   Dental advisory given  Plan Discussed with: CRNA  Anesthesia Plan Comments:         Anesthesia Quick Evaluation

## 2014-10-16 ENCOUNTER — Encounter (HOSPITAL_BASED_OUTPATIENT_CLINIC_OR_DEPARTMENT_OTHER): Payer: Self-pay | Admitting: Orthopedic Surgery

## 2015-01-18 ENCOUNTER — Other Ambulatory Visit: Payer: Self-pay | Admitting: *Deleted

## 2015-01-18 DIAGNOSIS — R609 Edema, unspecified: Secondary | ICD-10-CM

## 2015-01-18 MED ORDER — HYDROCHLOROTHIAZIDE 12.5 MG PO TABS: 12.5000 mg | ORAL_TABLET | Freq: Every day | ORAL | Status: DC

## 2015-03-22 ENCOUNTER — Ambulatory Visit (INDEPENDENT_AMBULATORY_CARE_PROVIDER_SITE_OTHER): Payer: BLUE CROSS/BLUE SHIELD | Admitting: Medical

## 2015-03-22 ENCOUNTER — Encounter: Payer: Self-pay | Admitting: Medical

## 2015-03-22 ENCOUNTER — Ambulatory Visit (HOSPITAL_BASED_OUTPATIENT_CLINIC_OR_DEPARTMENT_OTHER)
Admission: RE | Admit: 2015-03-22 | Discharge: 2015-03-22 | Disposition: A | Discharge status: Home / Self Care | Payer: BLUE CROSS/BLUE SHIELD | Source: Ambulatory Visit | Attending: Medical | Admitting: Medical

## 2015-03-22 VITALS — BP 126/80 | HR 77 | Temp 98.2°F | Ht 62.0 in | Wt 194.2 lb

## 2015-03-22 DIAGNOSIS — M25571 Pain in right ankle and joints of right foot: Secondary | ICD-10-CM | POA: Diagnosis not present

## 2015-03-22 MED ORDER — DICLOFENAC SODIUM 75 MG PO TBEC: 75.0000 mg | DELAYED_RELEASE_TABLET | Freq: Two times a day (BID) | ORAL | Status: DC

## 2015-03-22 NOTE — Progress Notes (Signed)
Pre visit review using our clinic review tool, if applicable. No additional management support is needed unless otherwise documented below in the visit note. 

## 2015-03-22 NOTE — Progress Notes (Signed)
Subjective:    Patient ID: Stephanie Castro, female    DOB: 10/31/1965, 50 y.o.   MRN: [id number]  HPI  Pt in reports she fell yesterday. She was in rush going to work. Tripped in grocery store.  She felt pain immediatlely in rt ankle and foot area. Later noted faint lower calf pain.    Review of Systems  Constitutional: Negative for fever, chills and fatigue.  Respiratory: Negative for cough, shortness of breath and wheezing.   Cardiovascular: Negative for chest pain and palpitations.  Musculoskeletal: Negative for back pain.       Rt foot and ankle pain.  Hematological: Negative for adenopathy. Does not bruise/bleed easily.  Psychiatric/Behavioral: Negative for behavioral problems and confusion.   Past Medical History  Diagnosis Date  . Hypertension   . GERD (gastroesophageal reflux disease)   . Dysplastic nevus     beneath r eye  . Squamous cell carcinoma, face 02/24/2014  . Arthritis     both knees , back and elbows  . Tear of medial meniscus of right knee 10/13/2014  . Primary localized osteoarthritis of right knee 10/13/2014    Social History   Social History  . Marital Status: Married    Spouse Name: N/A  . Number of Children: N/A  . Years of Education: N/A   Occupational History  . Not on file.   Social History Main Topics  . Smoking status: Former Research scientist (life sciences)  . Smokeless tobacco: Never Used     Comment: Quit in 2005  . Alcohol Use: 0.0 oz/week    0 Standard drinks or equivalent per week     Comment: social  . Drug Use: No  . Sexual Activity: Yes    Birth Control/ Protection: Surgical   Other Topics Concern  . Not on file   Social History Narrative    Past Surgical History  Procedure Laterality Date  . Abdominal hysterectomy    . Knee arthroscopy with medial menisectomy Right 10/13/2014    Procedure: RIGHT KNEE ARTHROSCOPY WITH PARTIAL MEDIAL MENISECTOMY AND CHONDROPLASTY ;  Surgeon: Marchia Bond, MD;  Location: [facility];  Service:  Orthopedics;  Laterality: Right;    Family History  Problem Relation Age of Onset  . Hyperlipidemia Mother   . Cancer Mother 27    breast  . Heart attack Father   . COPD Father   . Heart attack Sister   . Hyperlipidemia Sister   . Hypertension Sister   . Hyperlipidemia Maternal Aunt   . Cancer Maternal Aunt     breast  . Hypertension Maternal Uncle   . Diabetes Maternal Uncle   . Cancer Maternal Uncle     colon  . Hyperlipidemia Maternal Grandmother   . Diabetes Maternal Grandmother   . Cancer Maternal Grandmother     breast  . Diabetes Maternal Grandfather   . Sudden death Neg Hx     Allergies  Allergen Reactions  . Penicillins     Current Outpatient Prescriptions on File Prior to Visit  Medication Sig Dispense Refill  . cyanocobalamin (,VITAMIN B-12,) 1000 MCG/ML injection Inject 1 mL (1,000 mcg total) into the muscle every 30 (thirty) days. 1 mL 1  . hydrochlorothiazide (HYDRODIURIL) 12.5 MG tablet Take 1 tablet (12.5 mg total) by mouth daily. 90 tablet 0  . omeprazole (PRILOSEC) 40 MG capsule Take 40 mg by mouth daily.    . ondansetron (ZOFRAN) 4 MG tablet Take 1 tablet (4 mg total) by mouth every 8 (  eight) hours as needed for nausea or vomiting. 30 tablet 0  . phentermine 37.5 MG capsule Take 1 capsule (37.5 mg total) by mouth every morning. 30 capsule 1  . sennosides-docusate sodium (SENOKOT-S) 8.6-50 MG tablet Take 2 tablets by mouth daily. 30 tablet 1   No current facility-administered medications on file prior to visit.    BP 126/80 mmHg  Pulse 77  Temp(Src) 98.2 F (36.8 C) (Oral)  Ht 5\' 2"  (1.575 m)  Wt 194 lb 3.2 oz (88.089 kg)  BMI 35.51 kg/m2  SpO2 98%       Objective:   Physical Exam  General- No acute distress. Pleasant patient. Lungs- Clear, even and unlabored. Heart- regular rate and rhythm. Neurologic- CNII- XII grossly intact.  Rt knee- good rom. No pain. Rt lower ext- neg homans sign.(no pain on palpation of tibia or distal  fibula) Rt calf faint tender. Pain lower portion of calf on palpation. Rt ankle. Mild swollen. Faint lateral aspect pain. Rt foot- mild mid foot pain. But more pain distal 5th metarsal.       Assessment & Plan:  Will get xray of rt ankle and rt foot.  Ace wrap ankle and foot. If needed use your crutches.  Diclofenac for pain.  If pain persists past one week consider sports medicine.  If any pain behind knee/popliteal area then return for eval. If any occurs out town be seen as well.  Follow up in 7 days or as needed

## 2015-03-22 NOTE — Patient Instructions (Signed)
Will get xray of rt ankle and rt foot.  Ace wrap ankle and foot. If needed use your crutches.  Diclofenac for pain.  If pain persists past one week consider sports medicine.  If any pain behind knee/popliteal area then return for eval. If any occurs out town be seen as well.  Follow up in 7 days or as needed

## 2015-04-24 ENCOUNTER — Encounter: Payer: Self-pay | Admitting: Family Medicine

## 2015-04-24 ENCOUNTER — Ambulatory Visit (INDEPENDENT_AMBULATORY_CARE_PROVIDER_SITE_OTHER): Payer: BLUE CROSS/BLUE SHIELD | Admitting: Family Medicine

## 2015-04-24 ENCOUNTER — Other Ambulatory Visit: Payer: Self-pay | Admitting: Family Medicine

## 2015-04-24 VITALS — BP 123/83 | HR 65 | Temp 98.6°F | Resp 20 | Wt 192.0 lb

## 2015-04-24 DIAGNOSIS — R609 Edema, unspecified: Secondary | ICD-10-CM | POA: Diagnosis not present

## 2015-04-24 DIAGNOSIS — Z1239 Encounter for other screening for malignant neoplasm of breast: Secondary | ICD-10-CM

## 2015-04-24 DIAGNOSIS — Z6835 Body mass index (BMI) 35.0-35.9, adult: Secondary | ICD-10-CM

## 2015-04-24 DIAGNOSIS — E538 Deficiency of other specified B group vitamins: Secondary | ICD-10-CM

## 2015-04-24 DIAGNOSIS — R635 Abnormal weight gain: Secondary | ICD-10-CM

## 2015-04-24 DIAGNOSIS — Z1231 Encounter for screening mammogram for malignant neoplasm of breast: Secondary | ICD-10-CM

## 2015-04-24 HISTORY — DX: Abnormal weight gain: R63.5

## 2015-04-24 LAB — VITAMIN B12: Vitamin B-12: 330 pg/mL (ref 200–1100)

## 2015-04-24 LAB — TSH: TSH: 2.59 mIU/L

## 2015-04-24 MED ORDER — HYDROCHLOROTHIAZIDE 12.5 MG PO TABS: 12.5000 mg | ORAL_TABLET | Freq: Every day | ORAL | Status: DC

## 2015-04-24 MED FILL — HYDROCHLOROTHIAZIDE 12.5 MG: 12.5 | 90 days supply | Qty: 90 | Fill #0

## 2015-04-24 NOTE — Patient Instructions (Signed)

## 2015-04-24 NOTE — Progress Notes (Signed)
Patient ID: Stephanie Castro, female   DOB: 1965/07/26, 50 y.o.   MRN: [id number]      Patient ID: Stephanie Castro, female  DOB: [birth year], 50 y.o.   MRN: [id number]  Subjective:  Stephanie Castro is a 50 y.o. female present for transfer of care.  All past medical history, surgical history, allergies, family history, immunizations, medications and social history were update in the electronic medical record today.  Peripheral edema: Controlled. Compliant with HCTZ 12.5. Patient reports being on this mediation for years .Pt denies LE, chest pain, or shortness of breath. She exercises > 3 times a week. She completes boot camp. Watches the salt in her diet.   Health maintenance: mammogram overdue. Multiple members of family with breast cancer. Last mam 2014 birads 1. No abnormal mammograms per pt.   Weight gain: BMI 35, pt is frustrated with her weight. She exercises > 3x a week of boot camp. She has gained 11 lbs since September. She used phentermine briefly and was able to lose 10 lbs, but regained after stopping medicine. She is requesting phentermine again. She watches her calorie intake on her fitbit.    Past Medical History  Diagnosis Date  . Dysplastic nevus     beneath r eye  . Arthritis     both knees , back and elbows  . Tear of medial meniscus of right knee 10/13/2014  . Primary localized osteoarthritis of right knee 10/13/2014  . Squamous cell carcinoma, face 02/24/2014  . GERD (gastroesophageal reflux disease)   . Hypertension    Allergies  Allergen Reactions  . Penicillins    Past Surgical History  Procedure Laterality Date  . Abdominal hysterectomy    . Knee arthroscopy with medial menisectomy Right 10/13/2014    Procedure: RIGHT KNEE ARTHROSCOPY WITH PARTIAL MEDIAL MENISECTOMY AND CHONDROPLASTY ;  Surgeon: Marchia Bond, MD;  Location: [facility];  Service: Orthopedics;  Laterality: Right;   Family History  Problem Relation Age of Onset  . Hyperlipidemia Mother    . Cancer Mother 37    breast  . Heart attack Father   . COPD Father   . Heart attack Sister   . Hyperlipidemia Sister   . Hypertension Sister   . Hyperlipidemia Maternal Aunt   . Cancer Maternal Aunt     breast  . Hypertension Maternal Uncle   . Diabetes Maternal Uncle   . Cancer Maternal Uncle     colon  . Hyperlipidemia Maternal Grandmother   . Diabetes Maternal Grandmother   . Cancer Maternal Grandmother     breast  . Diabetes Maternal Grandfather   . Sudden death Neg Hx    Social History   Social History  . Marital Status: Married    Spouse Name: N/A  . Number of Children: N/A  . Years of Education: N/A   Occupational History  . Not on file.   Social History Main Topics  . Smoking status: Former Research scientist (life sciences)  . Smokeless tobacco: Never Used     Comment: Quit in 2005  . Alcohol Use: 0.0 oz/week    0 Standard drinks or equivalent per week     Comment: social  . Drug Use: No  . Sexual Activity: Yes    Birth Control/ Protection: Surgical   Other Topics Concern  . Not on file   Social History Narrative     ROS: Negative, with the exception of above mentioned in HPI  Objective: BP 123/83 mmHg  Pulse 65  Temp(Src) 98.6 F (37 C)  Resp 20  Wt 192 lb (87.091 kg)  SpO2 96%  Body mass index is 35.11 kg/(m^2).  Gen: Afebrile. No acute distress. Nontoxic in appearance, well-developed, well-nourished, female HENT: AT. [place].  MMM, no oral lesions Eyes:Pupils Equal Round Reactive to light, Extraocular movements intact,  Conjunctiva without redness, discharge or icterus. Neck/lymp/endocrine: Supple,no lymphadenopathy, no thyromegaly CV: RRR,no edema.  Chest: CTAB, no wheeze, rhonchi or crackles.  Neuro/Msk: Normal gait. PERLA. EOMi. Alert. Oriented x3.  Psych: Normal affect, dress and demeanor. Normal speech. Normal thought content and judgment.   Assessment/plan: Stephanie Castro is a 50 y.o. female present for Edema follow up and meet new provider.   Peripheral  edema - hydrochlorothiazide (HYDRODIURIL) 12.5 MG tablet; Take 1 tablet (12.5 mg total) by mouth daily.  Dispense: 90 tablet; Refill: 3  Vitamin B12 deficiency without anemia Pt needs refills on medications, no recent b12 level. Uses injections.  - B12  Weight gain - pt is frustrated with her weight, she does work out adequately from her report and monitors calories. She is requesting phentermine. I discussed studies do not support the benefit vs risk with this medication. I do not prescribe weight loss medications.  - We will look for other causes to her weight gain, starting with TSH. If she desires weight loss medication will need to see another provider for this.  - TSH  Breast cancer screening, high risk pt: - mammogram ordered today  F/u May for annual, unless lab irregularity.   Electronically signed by: Howard Pouch, DO [place]

## 2015-04-25 ENCOUNTER — Telehealth: Payer: Self-pay | Admitting: Family Medicine

## 2015-04-25 DIAGNOSIS — E538 Deficiency of other specified B group vitamins: Secondary | ICD-10-CM

## 2015-04-25 MED ORDER — CYANOCOBALAMIN 1000 MCG/ML IJ SOLN: 1000.0000 ug | INTRAMUSCULAR | Status: DC

## 2015-04-25 MED FILL — CYANOCOBALAMIN 1,000 MCG/ML: 1000 | 90 days supply | Qty: 3 | Fill #0

## 2015-04-25 NOTE — Telephone Encounter (Signed)
Please call pt: - her thyroid is functioning normal.  - Her B12 330, I will call in refills for her B12 inj. - pt voiced weight gain concerns, if she would like guidance on weight loss, or discuss non-stimulant medication options for assistance (she had asked for phentermine), I would be happy to discuss them with her. She would need to make an appt to discuss this issue.

## 2015-04-25 NOTE — Telephone Encounter (Signed)
Left message for patient with lab results and information on appt for weight loss options on patient voice mail

## 2015-06-15 ENCOUNTER — Encounter: Payer: Self-pay | Admitting: Family Medicine

## 2015-06-15 ENCOUNTER — Ambulatory Visit (INDEPENDENT_AMBULATORY_CARE_PROVIDER_SITE_OTHER): Payer: BLUE CROSS/BLUE SHIELD | Admitting: Family Medicine

## 2015-06-15 VITALS — BP 145/78 | HR 58 | Temp 98.0°F | Resp 20 | Wt 192.5 lb

## 2015-06-15 DIAGNOSIS — T783XXA Angioneurotic edema, initial encounter: Secondary | ICD-10-CM | POA: Diagnosis not present

## 2015-06-15 DIAGNOSIS — R609 Edema, unspecified: Secondary | ICD-10-CM | POA: Diagnosis not present

## 2015-06-15 DIAGNOSIS — R030 Elevated blood-pressure reading, without diagnosis of hypertension: Secondary | ICD-10-CM | POA: Diagnosis not present

## 2015-06-15 DIAGNOSIS — IMO0001 Reserved for inherently not codable concepts without codable children: Secondary | ICD-10-CM

## 2015-06-15 HISTORY — DX: Elevated blood-pressure reading, without diagnosis of hypertension: R03.0

## 2015-06-15 MED ORDER — HYDROCHLOROTHIAZIDE 25 MG PO TABS: 25.0000 mg | ORAL_TABLET | Freq: Every day | ORAL | Status: DC

## 2015-06-15 NOTE — Patient Instructions (Signed)
Peripheral Edema You have swelling in your legs (peripheral edema). This swelling is due to excess accumulation of salt and water in your body. Edema may be a sign of heart, kidney or liver disease, or a side effect of a medication. It may also be due to problems in the leg veins. Elevating your legs and using special support stockings may be very helpful, if the cause of the swelling is due to poor venous circulation. Avoid long periods of standing, whatever the cause. Treatment of edema depends on identifying the cause. Chips, pretzels, pickles and other salty foods should be avoided. Restricting salt in your diet is almost always needed. Water pills (diuretics) are often used to remove the excess salt and water from your body via urine. These medicines prevent the kidney from reabsorbing sodium. This increases urine flow. Diuretic treatment may also result in lowering of potassium levels in your body. Potassium supplements may be needed if you have to use diuretics daily. Daily weights can help you keep track of your progress in clearing your edema. You should call your caregiver for follow up care as recommended. SEEK IMMEDIATE MEDICAL CARE IF:   You have increased swelling, pain, redness, or heat in your legs.  You develop shortness of breath, especially when lying down.  You develop chest or abdominal pain, weakness, or fainting.  You have a fever.   This information is not intended to replace advice given to you by your health care provider. Make sure you discuss any questions you have with your health care provider.   Document Released: 02/21/2004 Document Revised: 04/07/2011 Document Reviewed: 07/26/2014 Elsevier Interactive Patient Education 2016 [place] could also try Horse chesnut seed extract. (monitor for bruising with this medicine, stop if easy bruising occurs) Check BP after starting increase dose of HCTZ (decrease if <110/60 or dizziness)

## 2015-06-15 NOTE — Progress Notes (Signed)
Patient ID: Stephanie Castro, female   DOB: [birth year], 50 y.o.   MRN: [id number]    Stephanie Castro , 1965-04-17, 71 y.o., female MRN: [id number]  CC: bilateral Edema Subjective: Pt presents for an acute OV with complaints of bilateral lower extremity edema  of 3 days duration.  Patient has been walking more often because her knee was bothering her from boot camp. She wants to maintain her work regimen. She denies more salt consumption. Patient states the swelling is worse at the end of the day and resolves with leg elevation. She has not ever worn compression stockings. She is prescribed HCTZ 12.5 mg for many years for this condition, but feels it is not as affective currently. She denies chest pain, shortness of breath, orthopnea.   Allergies  Allergen Reactions  . Penicillins    Social History  Substance Use Topics  . Smoking status: Former Research scientist (life sciences)  . Smokeless tobacco: Never Used     Comment: Quit in 2005  . Alcohol Use: 0.0 oz/week    0 Standard drinks or equivalent per week     Comment: social   Past Medical History  Diagnosis Date  . Dysplastic nevus     beneath r eye  . Arthritis     both knees , back and elbows  . Tear of medial meniscus of right knee 10/13/2014  . Primary localized osteoarthritis of right knee 10/13/2014  . Squamous cell carcinoma, face 02/24/2014  . GERD (gastroesophageal reflux disease)   . Hypertension    Past Surgical History  Procedure Laterality Date  . Abdominal hysterectomy    . Knee arthroscopy with medial menisectomy Right 10/13/2014    Procedure: RIGHT KNEE ARTHROSCOPY WITH PARTIAL MEDIAL MENISECTOMY AND CHONDROPLASTY ;  Surgeon: Marchia Bond, MD;  Location: [facility];  Service: Orthopedics;  Laterality: Right;   Family History  Problem Relation Age of Onset  . Hyperlipidemia Mother   . Breast cancer Mother 65    breast  . Heart attack Father   . COPD Father   . Heart attack Sister   . Hyperlipidemia Sister   . Hypertension  Sister   . Hyperlipidemia Maternal Aunt   . Breast cancer Maternal Aunt     breast  . Hypertension Maternal Uncle   . Diabetes Maternal Uncle   . Colon cancer Maternal Uncle     colon  . Hyperlipidemia Maternal Grandmother   . Diabetes Maternal Grandmother   . Breast cancer Maternal Grandmother     breast  . Diabetes Maternal Grandfather   . Sudden death Neg Hx      Medication List       This list is accurate as of: 06/15/15  1:43 PM.  Always use your most recent med list.               cyanocobalamin 1000 MCG/ML injection  Commonly known as:  (VITAMIN B-12)  Inject 1 mL (1,000 mcg total) into the muscle every 30 (thirty) days.     hydrochlorothiazide 12.5 MG tablet  Commonly known as:  HYDRODIURIL  Take 1 tablet (12.5 mg total) by mouth daily.         ROS: Negative, with the exception of above mentioned in HPI   Objective:  BP 145/78 mmHg  Pulse 58  Temp(Src) 98 F (36.7 C) (Oral)  Resp 20  Wt 192 lb 8 oz (87.317 kg)  SpO2 95% Body mass index is 35.2 kg/(m^2). Gen: Afebrile. No acute distress. Nontoxic in  Appearance. Well developed obese caucasian female.  HENT: AT. [place]. MMM Eyes:Pupils Equal Round Reactive to light, Extraocular movements intact,  Conjunctiva without redness, discharge or icterus. CV: RRR no murmur, trace edema, +2/4 P posterior tibialis pulses Chest: CTAB, no wheeze or crackles.  Skin: No rashes, purpura or petechiae. Skin intact, warm and well perfused. No signs of trauma.  Neuro: Normal gait. PERLA. EOMi. Alert. Oriented x3. Neg homan's bilateral.   Assessment/Plan: Stephanie Castro is a 50 y.o. female present for acute OV for  Peripheral edema - Low salt, elevation, BP control.  - Possibly heat vs walking related, not impressive on exam.  - hydrochlorothiazide (HYDRODIURIL) 25 MG tablet; Take 1 tablet (25 mg total) by mouth daily.  Dispense: 90 tablet; Refill: 1  Elevated BP - Mildly elevated today - low salt encouraged.  - Monitor  BP - Increase HCTZ to 25 mg QD - F/U 3 months   electronically signed by:  Howard Pouch, DO  [place]

## 2015-07-05 MED FILL — HYDROCHLOROTHIAZIDE 12.5 MG: 12.5 | 90 days supply | Qty: 90 | Fill #1

## 2015-08-08 ENCOUNTER — Ambulatory Visit (HOSPITAL_BASED_OUTPATIENT_CLINIC_OR_DEPARTMENT_OTHER)
Admission: RE | Admit: 2015-08-08 | Discharge: 2015-08-08 | Disposition: A | Discharge status: Home / Self Care | Payer: BLUE CROSS/BLUE SHIELD | Source: Ambulatory Visit | Attending: Family Medicine | Admitting: Family Medicine

## 2015-08-08 ENCOUNTER — Encounter: Payer: Self-pay | Admitting: Family Medicine

## 2015-08-08 ENCOUNTER — Ambulatory Visit (INDEPENDENT_AMBULATORY_CARE_PROVIDER_SITE_OTHER): Payer: BLUE CROSS/BLUE SHIELD | Admitting: Family Medicine

## 2015-08-08 VITALS — BP 123/83 | HR 63 | Temp 98.9°F | Resp 20 | Ht 61.5 in | Wt 191.8 lb

## 2015-08-08 DIAGNOSIS — R350 Frequency of micturition: Secondary | ICD-10-CM

## 2015-08-08 DIAGNOSIS — N390 Urinary tract infection, site not specified: Secondary | ICD-10-CM | POA: Diagnosis not present

## 2015-08-08 DIAGNOSIS — R319 Hematuria, unspecified: Secondary | ICD-10-CM

## 2015-08-08 DIAGNOSIS — R0609 Other forms of dyspnea: Secondary | ICD-10-CM

## 2015-08-08 DIAGNOSIS — R8290 Unspecified abnormal findings in urine: Secondary | ICD-10-CM

## 2015-08-08 DIAGNOSIS — R0600 Dyspnea, unspecified: Secondary | ICD-10-CM

## 2015-08-08 HISTORY — DX: Other forms of dyspnea: R06.09

## 2015-08-08 HISTORY — DX: Dyspnea, unspecified: R06.00

## 2015-08-08 LAB — POC URINALSYSI DIPSTICK (AUTOMATED)
BILIRUBIN UA: NEGATIVE
Glucose, UA: NEGATIVE
KETONES UA: NEGATIVE
LEUKOCYTES UA: NEGATIVE
Nitrite, UA: NEGATIVE
Protein, UA: NEGATIVE
Spec Grav, UA: 1.02
Urobilinogen, UA: 0.2
pH, UA: 6

## 2015-08-08 MED ORDER — PHENAZOPYRIDINE HCL 200 MG PO TABS: 200.0000 mg | ORAL_TABLET | Freq: Three times a day (TID) | ORAL | Status: DC | PRN

## 2015-08-08 MED ORDER — NITROFURANTOIN MONOHYD MACRO 100 MG PO CAPS: 100.0000 mg | ORAL_CAPSULE | Freq: Two times a day (BID) | ORAL | Status: DC

## 2015-08-08 MED FILL — NITROFURANTOIN MONO-MCR 100: 100 | 7 days supply | Qty: 14 | Fill #0

## 2015-08-08 NOTE — Patient Instructions (Signed)
I will call you once I receive your chest xray results If normal, we will need to see you back to complete labs/EKG etc and decide plan from there.   I have called in pyridium and Macrobid for presumed UTI. We will call with results of culture once available.

## 2015-08-08 NOTE — Progress Notes (Signed)
Patient ID: Stephanie Castro, female   DOB: 1965-08-03, 50 y.o.   MRN: [id number]    Stephanie Castro , 1965-05-25, 84 y.o., female MRN: [id number] Patient Care Team    Relationship Specialty Notifications Start End  Ma Hillock, DO PCP - General Family Medicine  04/24/15     CC: dysuria Subjective:   Dysuria: Pt presents for an acute OV with complaints of bladder spasm, urinary frequency of 2 days duration.  She denies nausea, vomit, diarrhea, low back pain, fever or chills. Pt has tried nothing  to ease their symptoms. She has had pan-sensitive e.coli in the past and states she is more prone to UTI in the summer.  Dyspnea on exertion: Patient also complains of shortness of breath with exertion. She states she was a runner last year until she has a knee injury. Now that she has returned to working out she is unable to even walk quickly without becoming winded. She states she experiences symptoms when climbing stairs and vacuuming as well. She endorses "chest tightness", without pain. She does endorse lightheadedness, to the point she needs sit and put her head down. She is a former smoker, about a 10 pack year history, quit in 2005. She denies syncope. She denies asthma history. She had a cardiac w/u 2012 that was normal, with similar complaints. She states her current symptoms are much worse and new since surgery, prior to that she was running daily. She still complains of leg swelling, despite increase in HCTZ to 25 mg. SHe states the increase dose was not helping so she went back to 12.5 mg. BP normal today.   Allergies  Allergen Reactions  . Penicillins    Social History  Substance Use Topics  . Smoking status: Former Research scientist (life sciences)  . Smokeless tobacco: Never Used     Comment: Quit in 2005  . Alcohol Use: 0.0 oz/week    0 Standard drinks or equivalent per week     Comment: social   Past Medical History  Diagnosis Date  . Dysplastic nevus     beneath r eye  . Arthritis     both knees , back  and elbows  . Tear of medial meniscus of right knee 10/13/2014  . Primary localized osteoarthritis of right knee 10/13/2014  . Squamous cell carcinoma, face 02/24/2014  . GERD (gastroesophageal reflux disease)   . Hypertension    Past Surgical History  Procedure Laterality Date  . Abdominal hysterectomy    . Knee arthroscopy with medial menisectomy Right 10/13/2014    Procedure: RIGHT KNEE ARTHROSCOPY WITH PARTIAL MEDIAL MENISECTOMY AND CHONDROPLASTY ;  Surgeon: Marchia Bond, MD;  Location: [facility];  Service: Orthopedics;  Laterality: Right;   Family History  Problem Relation Age of Onset  . Hyperlipidemia Mother   . Breast cancer Mother 76    breast  . Heart attack Father   . COPD Father   . Heart attack Sister   . Hyperlipidemia Sister   . Hypertension Sister   . Hyperlipidemia Maternal Aunt   . Breast cancer Maternal Aunt     breast  . Hypertension Maternal Uncle   . Diabetes Maternal Uncle   . Colon cancer Maternal Uncle     colon  . Hyperlipidemia Maternal Grandmother   . Diabetes Maternal Grandmother   . Breast cancer Maternal Grandmother     breast  . Diabetes Maternal Grandfather   . Sudden death Neg Hx      Medication List  This list is accurate as of: 08/08/15  4:29 PM.  Always use your most recent med list.               cyanocobalamin 1000 MCG/ML injection  Commonly known as:  (VITAMIN B-12)  Inject 1 mL (1,000 mcg total) into the muscle every 30 (thirty) days.     hydrochlorothiazide 12.5 MG tablet  Commonly known as:  HYDRODIURIL  Reported on 08/08/2015        No results found for this or any previous visit (from the past 24 hour(s)). No results found.   ROS: Negative, with the exception of above mentioned in HPI   Objective:  BP 123/83 mmHg  Pulse 63  Temp(Src) 98.9 F (37.2 C)  Resp 20  Ht 5' 1.5" (1.562 m)  Wt 191 lb 12 oz (86.977 kg)  BMI 35.65 kg/m2  SpO2 96% Body mass index is 35.65 kg/(m^2). Gen:  Afebrile. No acute distress. Nontoxic in appearance, well developed, well nourished.  HENT: AT. [place].  MMM, no oral lesions.  Eyes:Pupils Equal Round Reactive to light, Extraocular movements intact,  Conjunctiva without redness, discharge or icterus. Neck/lymp/endocrine: Supple CV: RRR nomurmur, no edema. Mild JVD present.  Chest: CTAB, no wheeze or crackles. Good air movement, normal resp effort.  Abd: Soft. NTND. BS presnet MSK: No CVA tenderness Neuro: Normal gait. PERLA. EOMi. Alert. Oriented x3 C Psych: Normal affect, dress and demeanor. Normal speech. Normal thought content and judgment.  Assessment/Plan: Stephanie Castro is a 50 y.o. female present for acute/fast track appt for dysuria.  Urinary frequencyAbnormal urine finding/ - POCT Urinalysis Dipstick (Automated) - Urine Culture - phenazopyridine (PYRIDIUM) 200 MG tablet; Take 1 tablet (200 mg total) by mouth 3 (three) times daily as needed for pain.  Dispense: 6 tablet; Refill: 0 - Macrobid prescribed - hydrate.   DOE (dyspnea on exertion) - Normal study 2012 with decrease exercise tolerance. JVD present on exam. Pt reports ankle swelling, no pitting edema. No current labs available. Discussed with patient is in a fast track appt for her dysuria. I will order a CXR to start work up, but she will need to make an appt to discuss this issue, have labs, ekg etc.   - DG Chest 2 View; Future - F/U ASAP  electronically signed by:  Howard Pouch, DO  [place]

## 2015-08-09 ENCOUNTER — Telehealth: Payer: Self-pay | Admitting: Family Medicine

## 2015-08-09 NOTE — Telephone Encounter (Signed)
Spoke with patient reviewed xray results. Patient will call back to schedule and appt.

## 2015-08-09 NOTE — Telephone Encounter (Signed)
Please call pt: - her cxr is normal. If she would like to [place] to further eval her shortness of breath, she should be encouraged to make an appt at her earliest convenience for [place] to complete full work up and labs, if she ihas not already. This should not be in an acute slot.

## 2015-08-10 ENCOUNTER — Telehealth: Payer: Self-pay | Admitting: Family Medicine

## 2015-08-10 LAB — URINE CULTURE

## 2015-08-10 NOTE — Telephone Encounter (Signed)
Please call pt: - her urine studies did not show significant bacteria growth to indicate infection.  - If she is still having symptoms we will need to investigate further.

## 2015-08-10 NOTE — Telephone Encounter (Signed)
Spoke with patient reviewed  results and information. Patient verbalized understanding. 

## 2015-08-23 ENCOUNTER — Other Ambulatory Visit: Payer: Self-pay | Admitting: *Deleted

## 2015-08-23 MED ORDER — HYDROCHLOROTHIAZIDE 25 MG PO TABS: 25.0000 mg | ORAL_TABLET | Freq: Every day | ORAL | 0 refills | Status: DC

## 2015-08-23 MED FILL — HYDROCHLOROTHIAZIDE 25 MG T: 25 | 90 days supply | Qty: 90 | Fill #0

## 2015-08-23 NOTE — Telephone Encounter (Signed)
HCTZ refilled. 

## 2015-08-28 MED FILL — CYANOCOBALAMIN 1,000 MCG/ML: 1000 | 90 days supply | Qty: 3 | Fill #1

## 2015-11-27 ENCOUNTER — Other Ambulatory Visit: Payer: Self-pay | Admitting: Family Medicine

## 2015-11-27 MED FILL — HYDROCHLOROTHIAZIDE 25 MG T: 25 | 90 days supply | Qty: 90 | Fill #0

## 2016-01-09 MED FILL — CYANOCOBALAMIN 1,000 MCG/ML: 1000 | 30 days supply | Qty: 1 | Fill #2

## 2016-02-28 ENCOUNTER — Encounter: Payer: Self-pay | Admitting: *Deleted

## 2016-02-28 ENCOUNTER — Encounter: Payer: Self-pay | Admitting: Family Medicine

## 2016-02-28 ENCOUNTER — Ambulatory Visit (INDEPENDENT_AMBULATORY_CARE_PROVIDER_SITE_OTHER): Payer: 59 | Admitting: Family Medicine

## 2016-02-28 ENCOUNTER — Other Ambulatory Visit: Payer: Self-pay | Admitting: Family Medicine

## 2016-02-28 VITALS — BP 114/74 | HR 77 | Temp 98.6°F | Resp 18 | Wt 190.0 lb

## 2016-02-28 DIAGNOSIS — R69 Illness, unspecified: Secondary | ICD-10-CM | POA: Diagnosis not present

## 2016-02-28 DIAGNOSIS — J111 Influenza due to unidentified influenza virus with other respiratory manifestations: Secondary | ICD-10-CM

## 2016-02-28 MED ORDER — OSELTAMIVIR PHOSPHATE 75 MG PO CAPS: 75.0000 mg | ORAL_CAPSULE | Freq: Two times a day (BID) | ORAL | 0 refills | Status: DC

## 2016-02-28 MED ORDER — HYDROCODONE-HOMATROPINE 5-1.5 MG/5ML PO SYRP: ORAL_SOLUTION | ORAL | 0 refills | Status: DC

## 2016-02-28 MED FILL — OSELTAMIVIR PHOS 75 MG CAP: 75 | 5 days supply | Qty: 10 | Fill #0

## 2016-02-28 MED FILL — HYDROCHLOROTHIAZIDE 25 MG T: 25 | 30 days supply | Qty: 30 | Fill #0

## 2016-02-28 MED FILL — HYDROCODONE-HOMATROPINE SYR: 5-1.5 | 12 days supply | Qty: 120 | Fill #0

## 2016-02-28 NOTE — Progress Notes (Signed)
OFFICE VISIT  02/28/2016   CC:  Chief Complaint  Patient presents with  . Cough    fever, chills, body aches    HPI:    Patient is a 51 y.o. Caucasian female who presents for "flu-like symptoms". Onset yesterday, fever to 104, cough, body aches, hurts in substernal area when coughs. Some loose BMs last couple days.  No n/v.  No runny nose/congestion.  Mild ST.  Signif HA. Ibuprofen q6h being taken.   Past Medical History:  Diagnosis Date  . Arthritis    both knees , back and elbows  . Dysplastic nevus    beneath r eye  . GERD (gastroesophageal reflux disease)   . Hypertension   . Primary localized osteoarthritis of right knee 10/13/2014  . Squamous cell carcinoma, face 02/24/2014  . Tear of medial meniscus of right knee 10/13/2014    Past Surgical History:  Procedure Laterality Date  . ABDOMINAL HYSTERECTOMY    . KNEE ARTHROSCOPY WITH MEDIAL MENISECTOMY Right 10/13/2014   Procedure: RIGHT KNEE ARTHROSCOPY WITH PARTIAL MEDIAL MENISECTOMY AND CHONDROPLASTY ;  Surgeon: Marchia Bond, MD;  Location: [facility];  Service: Orthopedics;  Laterality: Right;    Outpatient Medications Prior to Visit  Medication Sig Dispense Refill  . cyanocobalamin (,VITAMIN B-12,) 1000 MCG/ML injection Inject 1 mL (1,000 mcg total) into the muscle every 30 (thirty) days. 10 mL 1  . hydrochlorothiazide (HYDRODIURIL) 25 MG tablet TAKE 1 TABLET BY MOUTH DAILY 30 tablet 0  . nitrofurantoin, macrocrystal-monohydrate, (MACROBID) 100 MG capsule Take 1 capsule (100 mg total) by mouth 2 (two) times daily. (Patient not taking: Reported on 02/28/2016) 14 capsule 0  . phenazopyridine (PYRIDIUM) 200 MG tablet Take 1 tablet (200 mg total) by mouth 3 (three) times daily as needed for pain. (Patient not taking: Reported on 02/28/2016) 6 tablet 0   No facility-administered medications prior to visit.     Allergies  Allergen Reactions  . Penicillins     ROS As per HPI  PE: Blood pressure 114/74,  pulse 77, temperature 98.6 F (37 C), temperature source Temporal, resp. rate 18, weight 190 lb (86.2 kg), SpO2 95 %. VS: noted--normal. Gen: alert, NAD, NONTOXIC APPEARING. HEENT: eyes without injection, drainage, or swelling.  Ears: EACs clear, TMs with normal light reflex and landmarks.  Nose: No rhinorrhea or mucosal abnormaltiy. No purulent d/c.  No paranasal sinus TTP.  No facial swelling.  Throat and mouth without focal lesion.  No pharyngial swelling, erythema, or exudate.   Neck: supple, no LAD.   LUNGS: CTA bilat, nonlabored resps.   CV: RRR, no m/r/g. EXT: no c/c/e SKIN: no rash  LABS:    Chemistry      Component Value Date/Time   NA 137 10/12/2014 0945   K 4.1 10/12/2014 0945   CL 106 10/12/2014 0945   CO2 26 10/12/2014 0945   BUN 10 10/12/2014 0945   CREATININE 0.78 10/12/2014 0945   CREATININE 0.77 05/13/2013 1637      Component Value Date/Time   CALCIUM 9.3 10/12/2014 0945   ALKPHOS 67 05/23/2014 0828   AST 20 05/23/2014 0828   ALT 13 05/23/2014 0828   BILITOT 0.8 05/23/2014 0828       IMPRESSION AND PLAN:  Influenza-like illness, and we are in the middle of a busy flu season. She is w/in 48h of onset of sx's and she understands potential risks and benefits of tamiflu and wants to take this med. Tamiflu eRx'd, as well as hycodan syrup  1-2 tsp qhs prn, #120 ml. Get otc generic robitussin DM OR Mucinex DM and use as directed on the packaging for cough and congestion. Use otc generic saline nasal spray 2-3 times per day to irrigate/moisturize your nasal passages.  An After Visit Summary was printed and given to the patient.  FOLLOW UP: Return if symptoms worsen or fail to improve.  Signed:  Crissie Sickles, MD           02/28/2016

## 2016-02-28 NOTE — Telephone Encounter (Signed)
Rx for HCTZ 30 day supply sent to patient pharmacy. Patient needs a follow up appt for further refills. Message sent to patient in [place] Chart.

## 2016-03-03 ENCOUNTER — Encounter (HOSPITAL_BASED_OUTPATIENT_CLINIC_OR_DEPARTMENT_OTHER): Payer: Self-pay

## 2016-03-03 ENCOUNTER — Telehealth: Payer: Self-pay | Admitting: Family Medicine

## 2016-03-03 ENCOUNTER — Emergency Department (HOSPITAL_BASED_OUTPATIENT_CLINIC_OR_DEPARTMENT_OTHER)
Admission: EM | Admit: 2016-03-03 | Discharge: 2016-03-03 | Disposition: A | Discharge status: Home / Self Care | Payer: 59 | Attending: Emergency Medicine | Admitting: Emergency Medicine

## 2016-03-03 ENCOUNTER — Emergency Department (HOSPITAL_BASED_OUTPATIENT_CLINIC_OR_DEPARTMENT_OTHER): Payer: 59

## 2016-03-03 DIAGNOSIS — I10 Essential (primary) hypertension: Secondary | ICD-10-CM | POA: Insufficient documentation

## 2016-03-03 DIAGNOSIS — E876 Hypokalemia: Secondary | ICD-10-CM | POA: Diagnosis not present

## 2016-03-03 DIAGNOSIS — R0602 Shortness of breath: Secondary | ICD-10-CM | POA: Diagnosis not present

## 2016-03-03 DIAGNOSIS — J111 Influenza due to unidentified influenza virus with other respiratory manifestations: Secondary | ICD-10-CM | POA: Diagnosis not present

## 2016-03-03 DIAGNOSIS — Z87891 Personal history of nicotine dependence: Secondary | ICD-10-CM | POA: Insufficient documentation

## 2016-03-03 LAB — BASIC METABOLIC PANEL
ANION GAP: 9 (ref 5–15)
BUN: 14 mg/dL (ref 6–20)
CHLORIDE: 99 mmol/L — AB (ref 101–111)
CO2: 31 mmol/L (ref 22–32)
Calcium: 9.4 mg/dL (ref 8.9–10.3)
Creatinine, Ser: 0.85 mg/dL (ref 0.44–1.00)
GFR calc Af Amer: >60 mL/min (ref >60)
GLUCOSE: 89 mg/dL (ref 65–99)
POTASSIUM: 2.8 mmol/L — AB (ref 3.5–5.1)
Sodium: 139 mmol/L (ref 135–145)

## 2016-03-03 LAB — CBC WITH DIFFERENTIAL/PLATELET
BASOS PCT: 0 %
Basophils Absolute: 0 10*3/uL (ref 0.0–0.1)
EOS PCT: 1 %
Eosinophils Absolute: 0.1 10*3/uL (ref 0.0–0.7)
HEMATOCRIT: 40.5 % (ref 36.0–46.0)
HEMOGLOBIN: 14.3 g/dL (ref 12.0–15.0)
LYMPHS PCT: 49 %
Lymphs Abs: 2.8 10*3/uL (ref 0.7–4.0)
MCH: 30.5 pg (ref 26.0–34.0)
MCHC: 35.3 g/dL (ref 30.0–36.0)
MCV: 86.4 fL (ref 78.0–100.0)
MONOS PCT: 9 %
Monocytes Absolute: 0.5 10*3/uL (ref 0.1–1.0)
NEUTROS ABS: 2.3 10*3/uL (ref 1.7–7.7)
NEUTROS PCT: 41 %
Platelets: 217 10*3/uL (ref 150–400)
RBC: 4.69 MIL/uL (ref 3.87–5.11)
RDW: 12.6 % (ref 11.5–15.5)
WBC: 5.7 10*3/uL (ref 4.0–10.5)

## 2016-03-03 LAB — D-DIMER, QUANTITATIVE: D-Dimer, Quant: <0.27 ug/mL-FEU (ref 0.00–0.50)

## 2016-03-03 LAB — TROPONIN I: Troponin I: <0.03 ng/mL (ref <0.03)

## 2016-03-03 MED ORDER — POTASSIUM CHLORIDE 10 MEQ/100ML IV SOLN
10.0000 meq | Freq: Once | INTRAVENOUS | Status: AC
  Administered 2016-03-03: 10 meq via INTRAVENOUS
  Filled 2016-03-03: qty 100

## 2016-03-03 MED ORDER — IPRATROPIUM BROMIDE 0.02 % IN SOLN
0.5000 mg | Freq: Once | RESPIRATORY_TRACT | Status: AC
  Administered 2016-03-03: 0.5 mg via RESPIRATORY_TRACT
  Filled 2016-03-03: qty 2.5

## 2016-03-03 MED ORDER — POTASSIUM CHLORIDE CRYS ER 20 MEQ PO TBCR: 20.0000 meq | EXTENDED_RELEASE_TABLET | Freq: Every day | ORAL | 0 refills | Status: DC

## 2016-03-03 MED ORDER — ALBUTEROL SULFATE (2.5 MG/3ML) 0.083% IN NEBU
5.0000 mg | INHALATION_SOLUTION | Freq: Once | RESPIRATORY_TRACT | Status: AC
  Administered 2016-03-03: 5 mg via RESPIRATORY_TRACT
  Filled 2016-03-03: qty 6

## 2016-03-03 MED ORDER — POTASSIUM CHLORIDE CRYS ER 20 MEQ PO TBCR
40.0000 meq | EXTENDED_RELEASE_TABLET | Freq: Once | ORAL | Status: AC
  Administered 2016-03-03: 40 meq via ORAL
  Filled 2016-03-03: qty 2

## 2016-03-03 MED ORDER — ALBUTEROL SULFATE HFA 108 (90 BASE) MCG/ACT IN AERS: 1.0000 | INHALATION_SPRAY | Freq: Four times a day (QID) | RESPIRATORY_TRACT | 0 refills | Status: DC | PRN

## 2016-03-03 NOTE — Discharge Instructions (Signed)
Finish tamiflu as prescribed.   Take motrin, tylenol for fever,   Use albuterol every 4-6 hrs as needed for shortness of breath.   See your doctor. Repeat BMP in a week. Your potassium is low.   Take potassium supplement as prescribed.   Return to ER if you have worse shortness of breath, cough, wheezing, fevers.

## 2016-03-03 NOTE — Telephone Encounter (Signed)
Noted, if pt have new or worsening symptoms she should be seen.  Of note: I did not evaluate her, she was seen by a different provider.

## 2016-03-03 NOTE — Telephone Encounter (Signed)
Patient Name: Stephanie Castro  DOB: November 04, 1965    Initial Comment Caller states she has been short of breath, office transferred for triage prior to scheduling   Nurse Assessment  Nurse: Leilani Merl, RN, Nira Conn Date/Time (Eastern Time): 03/03/2016 8:27:24 AM  Confirm and document reason for call. If symptomatic, describe symptoms. ---Caller states that last week she was sent home with tamiflu to treat the flu, she has 2 pills left, she still feels short of breath with exertion, weakness and palpitations at times  Does the patient have any new or worsening symptoms? ---Yes  Will a triage be completed? ---Yes  Related visit to physician within the last 2 weeks? ---Yes  Does the PT have any chronic conditions? (i.e. diabetes, asthma, etc.) ---Yes  List chronic conditions. ---See MR  Is the patient pregnant or possibly pregnant? (Ask all females between the ages of 28-55) ---No  Is this a behavioral health or substance abuse call? ---No     Guidelines    Guideline Title Affirmed Question Affirmed Notes  Weakness (Generalized) and Fatigue Extra heart beats OR irregular heart beating (i.e., "palpitations")    Final Disposition User   Go to ED Now [place]ifer, RN, Heather    Referrals  GO TO FACILITY UNDECIDED   Disagree/Comply: Comply

## 2016-03-03 NOTE — ED Triage Notes (Signed)
C/o SOB x 2 days-palpitations-being treated for flu  from PCP-rx for tamilu and taking OTC meds-NAD-steady gait

## 2016-03-03 NOTE — ED Provider Notes (Signed)
[facility] Provider Note   CSN: [id number] Arrival date & time: 03/03/16  1313   By signing my name below, I, Delton Prairie, attest that this documentation has been prepared under the direction and in the presence of Drenda Freeze, MD  Electronically Signed: Delton Prairie, ED Scribe. 03/03/16. 7:38 PM.   History   Chief Complaint Chief Complaint  Patient presents with  . Shortness of Breath   The history is provided by the patient. No language interpreter was used.   HPI Comments:  Stephanie Castro is a 51 y.o. female, with a hx of HTN,  who presents to the Emergency Department complaining of SOB onset 2 days. Pt also reports palpitations onset yesterday and resolved diarrhea. Her SOB is worse with exertion. Pt visited her PCP on 02/28/2016, was told she likely had the flu due to recent sick contacts with the flu and was prescribed Tamiflu. She has 2 doses left to take. She has taken imodium for diarrhea with significant relief. Pt denies vomiting, current diarrhea and any other associated symptoms.   Past Medical History:  Diagnosis Date  . Arthritis    both knees , back and elbows  . Dysplastic nevus    beneath r eye  . GERD (gastroesophageal reflux disease)   . Hypertension   . Primary localized osteoarthritis of right knee 10/13/2014  . Squamous cell carcinoma, face 02/24/2014  . Tear of medial meniscus of right knee 10/13/2014    Patient Active Problem List   Diagnosis Date Noted  . DOE (dyspnea on exertion) 08/08/2015  . Elevated BP 06/15/2015  . Weight gain 04/24/2015  . Primary localized osteoarthritis of right knee 10/13/2014  . Squamous cell carcinoma, face 02/24/2014  . Vitamin D deficiency 02/24/2014  . Fatigue 07/06/2013  . GERD (gastroesophageal reflux disease) 07/06/2013  . Vitamin B12 deficiency without anemia 05/15/2013  . BMI 35.0-35.9,adult 05/15/2013  . Perimenopausal 05/15/2013  . Low TSH level 04/22/2013  . Breast cancer screening, high  risk patient 04/04/2013  . Periodic edema 04/04/2013  . Other and unspecified hyperlipidemia 04/06/2011    Past Surgical History:  Procedure Laterality Date  . ABDOMINAL HYSTERECTOMY    . KNEE ARTHROSCOPY WITH MEDIAL MENISECTOMY Right 10/13/2014   Procedure: RIGHT KNEE ARTHROSCOPY WITH PARTIAL MEDIAL MENISECTOMY AND CHONDROPLASTY ;  Surgeon: Marchia Bond, MD;  Location: [facility];  Service: Orthopedics;  Laterality: Right;    OB History    No data available       Home Medications    Prior to Admission medications   Medication Sig Start Date End Date Taking? Authorizing Provider  cyanocobalamin (,VITAMIN B-12,) 1000 MCG/ML injection Inject 1 mL (1,000 mcg total) into the muscle every 30 (thirty) days. 04/25/15   Renee A Kuneff, DO  hydrochlorothiazide (HYDRODIURIL) 25 MG tablet TAKE 1 TABLET BY MOUTH DAILY 02/28/16   Renee A Kuneff, DO  HYDROcodone-homatropine (HYCODAN) 5-1.5 MG/5ML syrup 1-2 tsp po qhs prn cough 02/28/16   Tammi Sou, MD  oseltamivir (TAMIFLU) 75 MG capsule Take 1 capsule (75 mg total) by mouth 2 (two) times daily. 02/28/16   Tammi Sou, MD    Family History Family History  Problem Relation Age of Onset  . Hyperlipidemia Mother   . Breast cancer Mother 29    breast  . Heart attack Father   . COPD Father   . Heart attack Sister   . Hyperlipidemia Sister   . Hypertension Sister   . Hyperlipidemia Maternal Aunt   .  Breast cancer Maternal Aunt     breast  . Hypertension Maternal Uncle   . Diabetes Maternal Uncle   . Colon cancer Maternal Uncle     colon  . Hyperlipidemia Maternal Grandmother   . Diabetes Maternal Grandmother   . Breast cancer Maternal Grandmother     breast  . Diabetes Maternal Grandfather   . Sudden death Neg Hx     Social History Social History  Substance Use Topics  . Smoking status: Former Research scientist (life sciences)  . Smokeless tobacco: Never Used     Comment: Quit in 2005  . Alcohol use 0.0 oz/week     Comment: social       Allergies   Penicillins   Review of Systems Review of Systems  Respiratory: Positive for shortness of breath.   Cardiovascular: Positive for palpitations.  Gastrointestinal: Positive for diarrhea. Negative for vomiting.  All other systems reviewed and are negative.  Physical Exam Updated Vital Signs BP 141/89 (BP Location: Left Arm)   Pulse (!) 54   Temp 98.5 F (36.9 C)   Resp 16   Ht 5\' 1"  (1.549 m)   Wt 190 lb (86.2 kg)   SpO2 100%   BMI 35.90 kg/m   Physical Exam  Constitutional: She is oriented to person, place, and time. She appears well-developed and well-nourished. No distress.  HENT:  Head: Normocephalic and atraumatic.  Mouth/Throat: Mucous membranes are dry.  Mucous membranes slightly dry  Eyes: EOM are normal.  Neck: Normal range of motion.  Cardiovascular: Normal rate, regular rhythm and normal heart sounds.   Pulmonary/Chest: Effort normal and breath sounds normal.  Abdominal: Soft. She exhibits no distension. There is no tenderness.  Musculoskeletal: Normal range of motion.  Neurological: She is alert and oriented to person, place, and time.  Skin: Skin is warm and dry.  Psychiatric: She has a normal mood and affect. Judgment normal.  Nursing note and vitals reviewed.  ED Treatments / Results  DIAGNOSTIC STUDIES:  Oxygen Saturation is 99% on RA, normal by my interpretation.    COORDINATION OF CARE:  7:36 PM Discussed treatment plan with pt at bedside and pt agreed to plan.  Labs (all labs ordered are listed, but only abnormal results are displayed) Labs Reviewed  BASIC METABOLIC PANEL - Abnormal; Notable for the following:       Result Value   Potassium 2.8 (*)    Chloride 99 (*)    All other components within normal limits  CBC WITH DIFFERENTIAL/PLATELET  TROPONIN I  D-DIMER, QUANTITATIVE (NOT AT [facility])    EKG  EKG Interpretation  Date/Time:  Monday March 03 2016 13:28:44 EST Ventricular Rate:  76 PR Interval:  154 QRS  Duration: 90 QT Interval:  388 QTC Calculation: 436 R Axis:   51 Text Interpretation:  Normal sinus rhythm ST & T wave abnormality, consider inferolateral ischemia Abnormal ECG No significant change since last tracing Confirmed by Brylinn Teaney  MD, Kalee Broxton ([id number]) on 03/03/2016 6:55:53 PM       Radiology Dg Chest 2 View  Result Date: 03/03/2016 CLINICAL DATA:  Shortness of breath, chest discomfort for 2 days. EXAM: CHEST  2 VIEW COMPARISON:  08/08/2015 FINDINGS: Heart and mediastinal contours are within normal limits. No focal opacities or effusions. No acute bony abnormality. IMPRESSION: No active cardiopulmonary disease. Electronically Signed   By: Rolm Baptise M.D.   On: 03/03/2016 13:45    Procedures Procedures (including critical care time)  Medications Ordered in ED Medications  potassium chloride 10  mEq in 100 mL IVPB (0 mEq Intravenous Stopped 03/03/16 2046)  potassium chloride SA (K-DUR,KLOR-CON) CR tablet 40 mEq (40 mEq Oral Given 03/03/16 1944)  albuterol (PROVENTIL) (2.5 MG/3ML) 0.083% nebulizer solution 5 mg (5 mg Nebulization Given 03/03/16 1941)  ipratropium (ATROVENT) nebulizer solution 0.5 mg (0.5 mg Nebulization Given 03/03/16 1941)     Initial Impression / Assessment and Plan / ED Course  I have reviewed the triage vital signs and the nursing notes.  Pertinent labs & imaging results that were available during my care of the patient were reviewed by me and considered in my medical decision making (see chart for details).     Stephanie Castro is a 51 y.o. female here with SOB, cough, flu syndrome. Never hypoxic. Is on tamiflu already. Has diminished breath sounds. Will get labs, d-dimer. Will give neb and reassess   9:37 PM CXR clear. WBC nl. D-dimer neg. Given 1 neb and felt better. Will dc home with albuterol prn. Can finish tamiflu.    Final Clinical Impressions(s) / ED Diagnoses   Final diagnoses:  None    New Prescriptions New Prescriptions   No medications on file  I  personally performed the services described in this documentation, which was scribed in my presence. The recorded information has been reviewed and is accurate.     Drenda Freeze, MD 03/03/16 2138

## 2016-03-03 NOTE — ED Notes (Signed)
ED Provider at bedside. 

## 2016-03-03 NOTE — Telephone Encounter (Signed)
FYI

## 2016-03-04 MED FILL — VENTOLIN HFA 90 MCG INHALER: 108 (90 BAS | 30 days supply | Qty: 18 | Fill #0

## 2016-03-04 MED FILL — POTASSIUM CL ER 20 MEQ TABL: 20 | 7 days supply | Qty: 7 | Fill #0

## 2016-03-04 NOTE — Telephone Encounter (Signed)
Patient went to ED as directed. Dr. Raoul Pitch is aware.

## 2016-03-06 ENCOUNTER — Ambulatory Visit (INDEPENDENT_AMBULATORY_CARE_PROVIDER_SITE_OTHER): Payer: 59 | Admitting: Family Medicine

## 2016-03-06 ENCOUNTER — Encounter: Payer: Self-pay | Admitting: Family Medicine

## 2016-03-06 VITALS — BP 123/80 | HR 66 | Temp 98.2°F | Resp 18 | Ht 61.0 in | Wt 188.5 lb

## 2016-03-06 DIAGNOSIS — R030 Elevated blood-pressure reading, without diagnosis of hypertension: Secondary | ICD-10-CM | POA: Diagnosis not present

## 2016-03-06 DIAGNOSIS — E538 Deficiency of other specified B group vitamins: Secondary | ICD-10-CM

## 2016-03-06 LAB — VITAMIN B12: VITAMIN B 12: 409 pg/mL (ref 211–911)

## 2016-03-06 MED ORDER — CYANOCOBALAMIN 1000 MCG/ML IJ SOLN: 1000.0000 ug | INTRAMUSCULAR | 1 refills | Status: DC

## 2016-03-06 MED ORDER — HYDROCHLOROTHIAZIDE 25 MG PO TABS: 25.0000 mg | ORAL_TABLET | Freq: Every day | ORAL | 1 refills | Status: DC

## 2016-03-06 MED FILL — CYANOCOBALAMIN 1,000 MCG/ML: 1000 | 30 days supply | Qty: 1 | Fill #3

## 2016-03-06 MED FILL — CYANOCOBALAMIN 1,000 MCG/ML: 1000 | 30 days supply | Qty: 1 | Fill #0

## 2016-03-06 NOTE — Progress Notes (Signed)
Stephanie Castro , [birth year], 51 y.o., female MRN: [id number] Patient Care Team    Relationship Specialty Notifications Start End  Ma Hillock, DO PCP - General Family Medicine  04/24/15     CC: Hypertenison Subjective:  Hypertension: Pt reports compliance with hypertension medications HCTZ. Her BP were borderline HTN and she had experienced peripheral edema. Since the start of HXTZ 25 mg QD  Her BP is better and her edema is improved. Patient denies chest pain, shortness of breath or lower extremity edema.   b12 deficiency: Pt has a h/o b12 deficiency with anemia. She is prescribed b12 IM monthly injections and reports she is in need of refills and is uncertain if it is working because she does not feel better. She currently is prescribed 1000 mcg monthly IM injection in which her daughter administered for her at home.    Allergies  Allergen Reactions  . Penicillins    Social History  Substance Use Topics  . Smoking status: Former Research scientist (life sciences)  . Smokeless tobacco: Never Used     Comment: Quit in 2005  . Alcohol use 0.0 oz/week     Comment: social   Past Medical History:  Diagnosis Date  . Arthritis    both knees , back and elbows  . Dysplastic nevus    beneath r eye  . GERD (gastroesophageal reflux disease)   . Hypertension   . Primary localized osteoarthritis of right knee 10/13/2014  . Squamous cell carcinoma, face 02/24/2014  . Tear of medial meniscus of right knee 10/13/2014   Past Surgical History:  Procedure Laterality Date  . ABDOMINAL HYSTERECTOMY    . KNEE ARTHROSCOPY WITH MEDIAL MENISECTOMY Right 10/13/2014   Procedure: RIGHT KNEE ARTHROSCOPY WITH PARTIAL MEDIAL MENISECTOMY AND CHONDROPLASTY ;  Surgeon: Marchia Bond, MD;  Location: [facility];  Service: Orthopedics;  Laterality: Right;   Family History  Problem Relation Age of Onset  . Hyperlipidemia Mother   . Breast cancer Mother 13    breast  . Heart attack Father   . COPD Father   . Heart  attack Sister   . Hyperlipidemia Sister   . Hypertension Sister   . Hyperlipidemia Maternal Aunt   . Breast cancer Maternal Aunt     breast  . Hypertension Maternal Uncle   . Diabetes Maternal Uncle   . Colon cancer Maternal Uncle     colon  . Hyperlipidemia Maternal Grandmother   . Diabetes Maternal Grandmother   . Breast cancer Maternal Grandmother     breast  . Diabetes Maternal Grandfather   . Sudden death Neg Hx    Allergies as of 03/06/2016      Reactions   Penicillins       Medication List       Accurate as of 03/06/16  8:20 AM. Always use your most recent med list.          albuterol 108 (90 Base) MCG/ACT inhaler Commonly known as:  PROVENTIL HFA;VENTOLIN HFA Inhale 1-2 puffs into the lungs every 6 (six) hours as needed for wheezing or shortness of breath.   cyanocobalamin 1000 MCG/ML injection Commonly known as:  (VITAMIN B-12) Inject 1 mL (1,000 mcg total) into the muscle every 30 (thirty) days.   hydrochlorothiazide 25 MG tablet Commonly known as:  HYDRODIURIL TAKE 1 TABLET BY MOUTH DAILY   potassium chloride SA 20 MEQ tablet Commonly known as:  K-DUR,KLOR-CON Take 1 tablet (20 mEq total) by mouth daily.  No results found for this or any previous visit (from the past 24 hour(s)). No results found.   ROS: Negative, with the exception of above mentioned in HPI   Objective:  BP 123/80 (BP Location: Right Arm, Patient Position: Sitting, Cuff Size: Large)   Pulse 66   Temp 98.2 F (36.8 C)   Resp 18   Ht 5\' 1"  (1.549 m)   Wt 188 lb 8 oz (85.5 kg)   SpO2 98%   BMI 35.62 kg/m  Body mass index is 35.62 kg/m. Gen: Afebrile. No acute distress. Nontoxic in appearance, well developed, well nourished.  HENT: AT. [place].  MMM, Eyes:Pupils Equal Round Reactive to light, Extraocular movements intact,  Conjunctiva without redness, discharge or icterus. CV: RRR, no edema Chest: CTAB, no wheeze or crackles. Good air movement, normal resp effort.  Abd:  Soft.. NTND. BS present.  Skin: no rashes, purpura or petechiae.  Neuro:  Normal gait. PERLA. EOMi. Alert. Oriented x3   Assessment/Plan: Stephanie Castro is a 51 y.o. female present for acute OV for  Elevated BP without diagnosis of hypertension - stable. Pressure were borderline to begin with and HCTZ was manly for edema.  - continue HCTZ.  - low salt.  - f/u every 6 months.   Vitamin B12 deficiency (non anemic) - if still low, may need to increase to every 2 week injections.  - cyanocobalamin (,VITAMIN B-12,) 1000 MCG/ML injection; Inject 1 mL (1,000 mcg total) into the muscle every 30 (thirty) days.  Dispense: 10 mL; Refill: 1 - B12    electronically signed by:  Howard Pouch, DO  [place]

## 2016-03-06 NOTE — Patient Instructions (Signed)
It was great to see you.  I will call with your lab results and call in the B12 once we see what your level is.   Keep up the good work with the exercise!   F/U 6 months   Please help [place] help you:  We are honored you have chosen [facility] for your Primary Care home. Below you will find basic instructions that you may need to access in the future. Please help [place] help you by reading the instructions, which cover many of the frequent questions we experience.   Prescription refills and request:  -In order to allow more efficient response time, please call your pharmacy for all refills. They will forward the request electronically to [place]. This allows for the quickest possible response. Request left on a nurse line can take longer to refill, since these are checked as time allows between office patients and other phone calls.  - refill request can take up to 3-5 working days to complete.  - If request is sent electronically and request is appropiate, it is usually completed in 1-2 business days.  - all patients will need to be seen routinely for all chronic medical conditions requiring prescription medications (see follow-up below). If you are overdue for follow up on your condition, you will be asked to make an appointment and we will call in enough medication to cover you until your appointment (up to 30 days).  - all controlled substances will require a face to face visit to request/refill.  - if you desire your prescriptions to go through a new pharmacy, and have an active script at original pharmacy, you will need to call your pharmacy and have scripts transferred to new pharmacy. This is completed between the pharmacy locations and not by your provider.    Results: If any images or labs were ordered, it can take up to 1 week to get results depending on the test ordered and the lab/facility running and resulting the test. - Normal or stable results, which do not need further discussion,  will be released to your mychart immediately with attached note to you. A call will not be generated for normal results. Please make certain to sign up for mychart. If you have questions on how to activate your mychart you can call the front office.  - If your results need further discussion, our office will attempt to contact you via phone, and if unable to reach you after 2 attempts, we will release your abnormal result to your mychart with instructions.  - All results will be automatically released in mychart after 1 week.  - Your provider will provide you with explanation and instruction on all relevant material in your results. Please keep in mind, results and labs may appear confusing or abnormal to the untrained eye, but it does not mean they are actually abnormal for you personally. If you have any questions about your results that are not covered, or you desire more detailed explanation than what was provided, you should make an appointment with your provider to do so.   Our office handles many outgoing and incoming calls daily. If we have not contacted you within 1 week about your results, please check your mychart to see if there is a message first and if not, then contact our office.  In helping with this matter, you help decrease call volume, and therefore allow [place] to be able to respond to patients needs more efficiently.   Acute office visits (sick  visit):  An acute visit is intended for a new problem and are scheduled in shorter time slots to allow schedule openings for patients with new problems. This is the appropriate visit to discuss a new problem. In order to provide you with excellent quality medical care with proper time for you to explain your problem, have an exam and receive treatment with instructions, these appointments should be limited to one new problem per visit. If you experience a new problem, in which you desire to be addressed, please make an acute office visit, we save  openings on the schedule to accommodate you. Please do not save your new problem for any other type of visit, let us take care of it properly and quickly for you.   Follow up visits:  Depending on your condition(s) your provider will need to see you routinely in order to provide you with quality care and prescribe medication(s). Most chronic conditions (Example: hypertension, Diabetes, depression/anxiety... etc), require visits a couple times a year. Your provider will instruct you on proper follow up for your personal medical conditions and history. Please make certain to make follow up appointments for your condition as instructed. Failing to do so could result in lapse in your medication treatment/refills. If you request a refill, and are overdue to be seen on a condition, we will always provide you with a 30 day script (once) to allow you time to schedule.    Medicare wellness (well visit): - we have a wonderful Nurse Maudie Mercury), that will meet with you and provide you will yearly medicare wellness visits. These visits should occur yearly (can not be scheduled less than 1 calendar year apart) and cover preventive health, immunizations, advance directives and screenings you are entitled to yearly through your medicare benefits. Do not miss out on your entitled benefits, this is when medicare will pay for these benefits to be ordered for you.  These are strongly encouraged by your provider and is the appropriate type of visit to make certain you are up to date with all preventive health benefits. If you have not had your medicare wellness exam in the last 12 months, please make certain to schedule one by calling the office and schedule your medicare wellness with Maudie Mercury as soon as possible.   Yearly physical (well visit):  - Adults are recommended to be seen yearly for physicals. Check with your insurance and date of your last physical, most insurances require one calendar year between physicals. Physicals  include all preventive health topics, screenings, medical exam and labs that are appropriate for gender/age and history. You may have fasting labs needed at this visit. This is a well visit (not a sick visit), acute topics should not be covered during this visit.  - Pediatric patients are seen more frequently when they are younger. Your provider will advise you on well child visit timing that is appropriate for your their age. - This is not a medicare wellness visit. Medicare wellness exams do not have an exam portion to the visit. Some medicare companies allow for a physical, some do not allow a yearly physical. If your medicare allows a yearly physical you can schedule the medicare wellness with our nurse Maudie Mercury and have your physical with your provider after, on the same day. Please check with insurance for your full benefits.   Late Policy/No Shows:  - all new patients should arrive 15-30 minutes earlier than appointment to allow [place] time  to  obtain all personal demographics,  insurance  information and for you to complete office paperwork. - All established patients should arrive 10-15 minutes earlier than appointment time to update all information and be checked in .  - In our best efforts to run on time, if you are late for your appointment you will be asked to either reschedule or if able, we will work you back into the schedule. There will be a wait time to work you back in the schedule,  depending on availability.  - If you are unable to make it to your appointment as scheduled, please call 24 hours ahead of time to allow [place] to fill the time slot with someone else who needs to be seen. If you do not cancel your appointment ahead of time, you may be charged a no show fee.

## 2016-03-07 ENCOUNTER — Telehealth: Payer: Self-pay | Admitting: Family Medicine

## 2016-03-07 NOTE — Telephone Encounter (Signed)
Please call pt: - her b12 is good > 400. - her b12 was called in yesterday. She can continue at the every month dose.

## 2016-03-07 NOTE — Telephone Encounter (Signed)
Patient notified and verbalized understanding. 

## 2016-03-31 MED FILL — HYDROCHLOROTHIAZIDE 25 MG T: 25 | 30 days supply | Qty: 30 | Fill #0

## 2016-05-01 MED FILL — HYDROCHLOROTHIAZIDE 25 MG T: 25 | 30 days supply | Qty: 30 | Fill #1

## 2016-06-03 MED FILL — HYDROCHLOROTHIAZIDE 25 MG T: 25 | 30 days supply | Qty: 30 | Fill #2

## 2016-07-02 MED FILL — HYDROCHLOROTHIAZIDE 25 MG T: 25 | 30 days supply | Qty: 30 | Fill #3

## 2016-08-11 MED FILL — HYDROCHLOROTHIAZIDE 25 MG T: 25 | 30 days supply | Qty: 30 | Fill #4

## 2016-09-10 MED FILL — HYDROCHLOROTHIAZIDE 25 MG T: 25 | 30 days supply | Qty: 30 | Fill #5

## 2016-10-10 ENCOUNTER — Other Ambulatory Visit: Payer: Self-pay | Admitting: Family Medicine

## 2016-10-10 MED FILL — HYDROCHLOROTHIAZIDE 25 MG T: 25 | 31 days supply | Qty: 31 | Fill #0

## 2016-10-31 ENCOUNTER — Encounter: Payer: Self-pay | Admitting: Family Medicine

## 2016-10-31 ENCOUNTER — Ambulatory Visit (INDEPENDENT_AMBULATORY_CARE_PROVIDER_SITE_OTHER): Payer: 59 | Admitting: Family Medicine

## 2016-10-31 VITALS — BP 128/82 | HR 51 | Temp 98.0°F | Resp 20 | Wt 197.2 lb

## 2016-10-31 DIAGNOSIS — R301 Vesical tenesmus: Secondary | ICD-10-CM

## 2016-10-31 DIAGNOSIS — R309 Painful micturition, unspecified: Secondary | ICD-10-CM

## 2016-10-31 DIAGNOSIS — R8290 Unspecified abnormal findings in urine: Secondary | ICD-10-CM

## 2016-10-31 LAB — POC URINALSYSI DIPSTICK (AUTOMATED)
Bilirubin, UA: NEGATIVE
Glucose, UA: NEGATIVE
KETONES UA: NEGATIVE
Leukocytes, UA: NEGATIVE
Nitrite, UA: NEGATIVE
PH UA: 7 (ref 5.0–8.0)
PROTEIN UA: NEGATIVE
SPEC GRAV UA: 1.015 (ref 1.010–1.025)
Urobilinogen, UA: 0.2 E.U./dL

## 2016-10-31 MED ORDER — PHENAZOPYRIDINE HCL 100 MG PO TABS: 100.0000 mg | ORAL_TABLET | Freq: Three times a day (TID) | ORAL | 0 refills | Status: DC | PRN

## 2016-10-31 MED ORDER — NITROFURANTOIN MONOHYD MACRO 100 MG PO CAPS: 100.0000 mg | ORAL_CAPSULE | Freq: Two times a day (BID) | ORAL | 0 refills | Status: DC

## 2016-10-31 MED FILL — PHENAZOPYRIDINE 100 MG TAB: 100 | 4 days supply | Qty: 10 | Fill #0

## 2016-10-31 MED FILL — NITROFURANTOIN MONO-MCR 100: 100 | 7 days supply | Qty: 14 | Fill #0

## 2016-10-31 NOTE — Progress Notes (Signed)
Stephanie Castro , [birth year], 51 y.o., female MRN: [id number] Patient Care Team    Relationship Specialty Notifications Start End  Ma Hillock, DO PCP - General Family Medicine  04/24/15     Chief Complaint  Patient presents with  . Dysuria    bladder spasm, unable to empty bladder completly     Subjective: Pt presents for an OV with complaints of bladder spasm of 1 week duration.  Associated symptoms include difficult to empty bladder. She reports she feels her bladder is in a spasm and will not relax. She reports a history of stress incontinence mostly with coughing. She does however endorse more urgency over the last few months. She had frequent UTIs in 2014 that were pansensitive Escherichia coli. She reports a few UTIs last summer that were treated at an urgent care. He complained of a UTI and was seen here last year, however urine culture were negative. She is never had kidney stones. She is a nonsmoker.  Depression screen [facility] 2/9 10/31/2016 09/15/2014  Decreased Interest 0 0  Down, Depressed, Hopeless 0 0  PHQ - 2 Score 0 0    Allergies  Allergen Reactions  . Penicillins    Social History  Substance Use Topics  . Smoking status: Former Research scientist (life sciences)  . Smokeless tobacco: Never Used     Comment: Quit in 2005  . Alcohol use 0.0 oz/week     Comment: social   Past Medical History:  Diagnosis Date  . Arthritis    both knees , back and elbows  . Dysplastic nevus    beneath r eye  . GERD (gastroesophageal reflux disease)   . Hypertension   . Primary localized osteoarthritis of right knee 10/13/2014  . Squamous cell carcinoma, face 02/24/2014  . Tear of medial meniscus of right knee 10/13/2014   Past Surgical History:  Procedure Laterality Date  . ABDOMINAL HYSTERECTOMY    . KNEE ARTHROSCOPY WITH MEDIAL MENISECTOMY Right 10/13/2014   Procedure: RIGHT KNEE ARTHROSCOPY WITH PARTIAL MEDIAL MENISECTOMY AND CHONDROPLASTY ;  Surgeon: Marchia Bond, MD;  Location: [place];  Service: Orthopedics;  Laterality: Right;   Family History  Problem Relation Age of Onset  . Hyperlipidemia Mother   . Breast cancer Mother 59       breast  . Heart attack Father   . COPD Father   . Heart attack Sister   . Hyperlipidemia Sister   . Hypertension Sister   . Hyperlipidemia Maternal Aunt   . Breast cancer Maternal Aunt        breast  . Hypertension Maternal Uncle   . Diabetes Maternal Uncle   . Colon cancer Maternal Uncle        colon  . Hyperlipidemia Maternal Grandmother   . Diabetes Maternal Grandmother   . Breast cancer Maternal Grandmother        breast  . Diabetes Maternal Grandfather   . Sudden death Neg Hx    Allergies as of 10/31/2016      Reactions   Penicillins       Medication List       Accurate as of 10/31/16 10:45 AM. Always use your most recent med list.          albuterol 108 (90 Base) MCG/ACT inhaler Commonly known as:  PROVENTIL HFA;VENTOLIN HFA Inhale 1-2 puffs into the lungs every 6 (six) hours as needed for wheezing or shortness of breath.   cyanocobalamin 1000 MCG/ML injection Commonly known as:  (  VITAMIN B-12) Inject 1 mL (1,000 mcg total) into the muscle every 30 (thirty) days.   hydrochlorothiazide 25 MG tablet Commonly known as:  HYDRODIURIL TAKE 1 TABLET (25 MG TOTAL) BY MOUTH DAILY.   potassium chloride SA 20 MEQ tablet Commonly known as:  K-DUR,KLOR-CON Take 1 tablet (20 mEq total) by mouth daily.       All past medical history, surgical history, allergies, family history, immunizations andmedications were updated in the EMR today and reviewed under the history and medication portions of their EMR.     ROS: Negative, with the exception of above mentioned in HPI   Objective:  BP 128/82 (BP Location: Right Arm, Patient Position: Sitting, Cuff Size: Large)   Pulse (!) 51   Temp 98 F (36.7 C)   Resp 20   Wt 197 lb 4 oz (89.5 kg)   SpO2 97%   BMI 37.27 kg/m  Body mass index is 37.27 kg/m. Gen:  Afebrile. No acute distress. Nontoxic in appearance, well developed, well nourished. Pleasant Caucasian female. HENT: AT. [place].  MMM, no oral lesions.  Eyes:Pupils Equal Round Reactive to light, Extraocular movements intact,  Conjunctiva without redness, discharge or icterus. CV: RRR Chest: CTAB, no wheeze or crackles.  Abd: Soft. Obese. ND. Moderate suprapubic pressure. BS present. No Masses palpated. No rebound or guarding.  MSK: No CVA tenderness bilaterally Neuro:  Normal gait. Alert. Oriented x3  No exam data present No results found. Results for orders placed or performed in visit on 10/31/16 (from the past 24 hour(s))  POCT Urinalysis Dipstick (Automated)     Status: Abnormal   Collection Time: 10/31/16 10:39 AM  Result Value Ref Range   Color, UA yellow    Clarity, UA clear    Glucose, UA Negative    Bilirubin, UA Negative    Ketones, UA Negative    Spec Grav, UA 1.015 1.010 - 1.025   Blood, UA trace    pH, UA 7.0 5.0 - 8.0   Protein, UA Negative    Urobilinogen, UA 0.2 0.2 or 1.0 E.U./dL   Nitrite, UA Negative    Leukocytes, UA Negative Negative    Assessment/Plan: Stephanie Castro is a 51 y.o. female present for OV for  Pain with urination/Abnormal urine finding/Bladder spasm - Discussed with patient her last couple of urine samples collected in the EMR system have not been positive for bacteria. Will treat prophylactically today with Macrobid and Pyridium. If urine studies are negative again would refer to urology for further evaluation and urinary studies and/or trial of bladder antispasmodic. - POCT Urinalysis Dipstick (Automated)--> trace RBC only.  - Urine Culture Sent to be complete - Follow-up depending upon lab results.   Reviewed expectations re: course of current medical issues.  Discussed self-management of symptoms.  Outlined signs and symptoms indicating need for more acute intervention.  Patient verbalized understanding and all questions were  answered.  Patient received an After-Visit Summary.    Orders Placed This Encounter  Procedures  . POCT Urinalysis Dipstick (Automated)     Note is dictated utilizing voice recognition software. Although note has been proof read prior to signing, occasional typographical errors still can be missed. If any questions arise, please do not hesitate to call for verification.   electronically signed by:  Howard Pouch, DO  [place]

## 2016-10-31 NOTE — Patient Instructions (Signed)
Pyridium and Macrobid called to pharmacy.  We will call you Monday with results to culture. If negative, would refer to urology for evaluation.

## 2016-11-01 LAB — URINE CULTURE
MICRO NUMBER: 81112871
SPECIMEN QUALITY: ADEQUATE

## 2016-11-03 ENCOUNTER — Telehealth: Payer: Self-pay | Admitting: Family Medicine

## 2016-11-03 DIAGNOSIS — R300 Dysuria: Secondary | ICD-10-CM

## 2016-11-03 DIAGNOSIS — N3289 Other specified disorders of bladder: Secondary | ICD-10-CM

## 2016-11-03 DIAGNOSIS — R32 Unspecified urinary incontinence: Secondary | ICD-10-CM

## 2016-11-03 NOTE — Telephone Encounter (Signed)
-----   Message from Leota Jacobsen, LPN sent at 50/05/3974  3:45 PM EDT ----- Spoke with patient reviewed results. Patient agreeable to seeing urology as long as its someone in her insurance network.

## 2016-11-03 NOTE — Telephone Encounter (Signed)
Urology referral placed

## 2016-11-18 MED FILL — HYDROCHLOROTHIAZIDE 25 MG T: 25 | 31 days supply | Qty: 31 | Fill #1

## 2016-12-12 DIAGNOSIS — R3915 Urgency of urination: Secondary | ICD-10-CM | POA: Diagnosis not present

## 2016-12-12 MED FILL — OXYBUTYNIN CL ER 10 MG TAB: 10 | 30 days supply | Qty: 30 | Fill #0

## 2016-12-17 MED FILL — HYDROCHLOROTHIAZIDE 25 MG T: 25 | 28 days supply | Qty: 28 | Fill #2

## 2017-01-15 ENCOUNTER — Encounter: Payer: Self-pay | Admitting: *Deleted

## 2017-01-15 ENCOUNTER — Other Ambulatory Visit: Payer: Self-pay | Admitting: Family Medicine

## 2017-01-15 ENCOUNTER — Telehealth: Payer: Self-pay | Admitting: Family Medicine

## 2017-01-15 MED FILL — HYDROCHLOROTHIAZIDE 25 MG T: 25 | 30 days supply | Qty: 30 | Fill #0

## 2017-01-15 NOTE — Telephone Encounter (Signed)
Copied from [place] [phone]. Topic: Quick Communication - See Telephone Encounter >> Jan 15, 2017 12:14 PM Corie Chiquito, [place] wrote: CRM for notification. See Telephone encounter for: Patient was calling because she needs a refill on her Hydrochlorothiazide and Cyanocobalamin. If someone could give her a call back at [phone]  01/15/17.

## 2017-01-15 NOTE — Telephone Encounter (Addendum)
Attempted to contact pt regarding refill request; per email note form Etheleen Sia, LPN dated 71/69/67, "30 day supply of HCTZ sent to your pharmacy. You will need an office visit prior to anymore refills. Please call our office [phone] to schedule your appointment . Hope you have a great day."  left message on voice mail at [phone].

## 2017-02-16 ENCOUNTER — Other Ambulatory Visit: Payer: Self-pay | Admitting: Family Medicine

## 2017-02-16 NOTE — Telephone Encounter (Signed)
Dr. Kuneff pt.  

## 2017-02-17 NOTE — Telephone Encounter (Signed)
Copied from [place]. Topic: Quick Communication - Rx Refill/Question >> Feb 17, 2017 10:18 AM Scherrie Gerlach wrote: Medication:  hydrochlorothiazide (HYDRODIURIL) 25 MG tablet cyanocobalamin (,VITAMIN B-12,) 1000 MCG/ML injection  Pt has made an appt for 03/06/17, but requesting a 30 day to get her through to appt [place], [place] - [place] [phone] (Phone) [phone] (Fax)

## 2017-02-18 ENCOUNTER — Encounter: Payer: Self-pay | Admitting: Family Medicine

## 2017-02-18 ENCOUNTER — Encounter: Payer: Self-pay | Admitting: *Deleted

## 2017-02-18 ENCOUNTER — Other Ambulatory Visit: Payer: Self-pay | Admitting: Family Medicine

## 2017-02-18 MED ORDER — HYDROCHLOROTHIAZIDE 25 MG PO TABS: 25.0000 mg | ORAL_TABLET | Freq: Every day | ORAL | 0 refills | Status: DC

## 2017-02-18 MED FILL — HYDROCHLOROTHIAZIDE 25 MG T: 25 | 10 days supply | Qty: 10 | Fill #0

## 2017-02-18 NOTE — Telephone Encounter (Signed)
Patient has scheduled an appt with Dr Nani Ravens for 02/26/17 sent in 10 day supply of HCTZ to get patient through until her appt.

## 2017-02-26 ENCOUNTER — Encounter: Payer: Self-pay | Admitting: Family Medicine

## 2017-02-26 ENCOUNTER — Ambulatory Visit: Payer: 59 | Admitting: Family Medicine

## 2017-02-26 VITALS — BP 110/78 | HR 58 | Temp 98.6°F | Ht 62.0 in | Wt 190.1 lb

## 2017-02-26 DIAGNOSIS — J45990 Exercise induced bronchospasm: Secondary | ICD-10-CM | POA: Insufficient documentation

## 2017-02-26 DIAGNOSIS — E538 Deficiency of other specified B group vitamins: Secondary | ICD-10-CM

## 2017-02-26 DIAGNOSIS — I10 Essential (primary) hypertension: Secondary | ICD-10-CM | POA: Insufficient documentation

## 2017-02-26 HISTORY — DX: Essential (primary) hypertension: I10

## 2017-02-26 LAB — COMPREHENSIVE METABOLIC PANEL
ALT: 17 U/L (ref 0–35)
AST: 21 U/L (ref 0–37)
Albumin: 4.2 g/dL (ref 3.5–5.2)
Alkaline Phosphatase: 68 U/L (ref 39–117)
BUN: 14 mg/dL (ref 6–23)
CHLORIDE: 102 meq/L (ref 96–112)
CO2: 29 meq/L (ref 19–32)
CREATININE: 0.72 mg/dL (ref 0.40–1.20)
Calcium: 9.6 mg/dL (ref 8.4–10.5)
GFR: 90.18 mL/min (ref >60.00)
GLUCOSE: 68 mg/dL — AB (ref 70–99)
Potassium: 3.3 mEq/L — ABNORMAL LOW (ref 3.5–5.1)
SODIUM: 136 meq/L (ref 135–145)
Total Bilirubin: 0.6 mg/dL (ref 0.2–1.2)
Total Protein: 7.2 g/dL (ref 6.0–8.3)

## 2017-02-26 LAB — CBC
HEMATOCRIT: 40.4 % (ref 36.0–46.0)
HEMOGLOBIN: 13.8 g/dL (ref 12.0–15.0)
MCHC: 34.1 g/dL (ref 30.0–36.0)
MCV: 89.7 fl (ref 78.0–100.0)
Platelets: 237 10*3/uL (ref 150.0–400.0)
RBC: 4.5 Mil/uL (ref 3.87–5.11)
RDW: 13.3 % (ref 11.5–15.5)
WBC: 8.4 10*3/uL (ref 4.0–10.5)

## 2017-02-26 LAB — VITAMIN B12: Vitamin B-12: 230 pg/mL (ref 211–911)

## 2017-02-26 MED ORDER — HYDROCHLOROTHIAZIDE 25 MG PO TABS: 25.0000 mg | ORAL_TABLET | Freq: Every day | ORAL | 5 refills | Status: DC

## 2017-02-26 MED ORDER — CYANOCOBALAMIN 1000 MCG/ML IJ SOLN: 1000.0000 ug | INTRAMUSCULAR | 1 refills | Status: DC

## 2017-02-26 MED FILL — CYANOCOBALAMIN 1,000 MCG/ML: 1000 | 30 days supply | Qty: 1 | Fill #0

## 2017-02-26 MED FILL — HYDROCHLOROTHIAZIDE 25 MG T: 25 | 30 days supply | Qty: 30 | Fill #0

## 2017-02-26 NOTE — Progress Notes (Signed)
Chief Complaint  Patient presents with  . Establish Care       New Patient Visit SUBJECTIVE: HPI: Stephanie Castro is an 52 y.o.female who is being seen for establishing care.  The patient was previously seen at [facility].   Hypertension Patient presents for hypertension follow up. She does monitor home blood pressures. Blood pressures ranging on average from 100's/60-70's. She is compliant with medications - HCTZ 25 mg/d. Patient has these side effects of medication: none She is adhering to a healthy diet overall. Exercise: aerobics, yoga, walking  Hx of low B12, takes injections, PO formulations are not effective.   Past Medical History:  Diagnosis Date  . Arthritis    both knees , back and elbows  . Dysplastic nevus    beneath r eye  . GERD (gastroesophageal reflux disease)   . Hypertension   . Primary localized osteoarthritis of right knee 10/13/2014  . Squamous cell carcinoma, face 02/24/2014  . Tear of medial meniscus of right knee 10/13/2014    No LMP recorded. Patient has had a hysterectomy.  ROS Cardiovascular: Denies chest pain  Respiratory: Denies dyspnea   OBJECTIVE: BP 110/78 (BP Location: Left Arm, Patient Position: Sitting, Cuff Size: Large)   Pulse (!) 58   Temp 98.6 F (37 C) (Oral)   Ht 5\' 2"  (1.575 m)   Wt 190 lb 2 oz (86.2 kg)   SpO2 98%   BMI 34.77 kg/m   Constitutional: -  VS reviewed -  Well developed, well nourished, appears stated age -  No apparent distress  Psychiatric: -  Oriented to person, place, and time -  Memory intact -  Affect and mood normal -  Fluent conversation, good eye contact -  Judgment and insight age appropriate  Eye: -  Conjunctivae clear, no discharge -  Pupils symmetric, round, reactive to light  ENMT: -  MMM    Pharynx moist, no exudate, no erythema  Neck: -  No gross swelling, no palpable masses -  Thyroid midline, not enlarged, mobile, no palpable masses  Cardiovascular: -  RRR -  No bruits -  No LE  edema  Respiratory: -  Normal respiratory effort, no accessory muscle use, no retraction -  Breath sounds equal, no wheezes, no ronchi, no crackles  Skin: -  No significant lesion on inspection -  Warm and dry to palpation   ASSESSMENT/PLAN: Vitamin B12 deficiency without anemia - Plan: CBC, B12  Vitamin B12 deficiency (non anemic) - Plan: cyanocobalamin (,VITAMIN B-12,) 1000 MCG/ML injection  Essential hypertension - Plan: Comprehensive metabolic panel  Patient instructed to sign release of records form from her previous PCP. Counseled on diet and exercise. She is doing well. OK to check BP's whenever she wishes. Ck B12, sounds like IF has been checked.  Patient should return in 6 mo. The patient voiced understanding and agreement to the plan.   [place], DO 02/26/17  11:03 AM

## 2017-02-26 NOTE — Patient Instructions (Signed)
I will let you know your results in the next 1-2 days.  Keep up the work.   Let us know if you need anything.

## 2017-02-26 NOTE — Progress Notes (Signed)
Pre visit review using our clinic review tool, if applicable. No additional management support is needed unless otherwise documented below in the visit note. 

## 2017-02-27 ENCOUNTER — Encounter: Payer: 59 | Admitting: Internal Medicine

## 2017-02-27 ENCOUNTER — Other Ambulatory Visit: Payer: Self-pay | Admitting: Family Medicine

## 2017-02-27 DIAGNOSIS — E538 Deficiency of other specified B group vitamins: Secondary | ICD-10-CM

## 2017-02-27 DIAGNOSIS — E876 Hypokalemia: Secondary | ICD-10-CM

## 2017-03-06 ENCOUNTER — Ambulatory Visit: Payer: 59 | Admitting: Family Medicine

## 2017-03-06 ENCOUNTER — Other Ambulatory Visit: Payer: Self-pay | Admitting: Family Medicine

## 2017-03-06 ENCOUNTER — Other Ambulatory Visit (INDEPENDENT_AMBULATORY_CARE_PROVIDER_SITE_OTHER): Payer: 59

## 2017-03-06 DIAGNOSIS — E538 Deficiency of other specified B group vitamins: Secondary | ICD-10-CM

## 2017-03-06 DIAGNOSIS — E876 Hypokalemia: Secondary | ICD-10-CM

## 2017-03-06 LAB — MAGNESIUM: Magnesium: 2 mg/dL (ref 1.5–2.5)

## 2017-03-06 LAB — BASIC METABOLIC PANEL
BUN: 15 mg/dL (ref 6–23)
CALCIUM: 9 mg/dL (ref 8.4–10.5)
CO2: 27 meq/L (ref 19–32)
CREATININE: 0.8 mg/dL (ref 0.40–1.20)
Chloride: 103 mEq/L (ref 96–112)
GFR: 79.85 mL/min (ref >60.00)
Glucose, Bld: 113 mg/dL — ABNORMAL HIGH (ref 70–99)
Potassium: 3.3 mEq/L — ABNORMAL LOW (ref 3.5–5.1)
Sodium: 137 mEq/L (ref 135–145)

## 2017-03-06 LAB — VITAMIN B12: Vitamin B-12: 329 pg/mL (ref 211–911)

## 2017-03-06 MED ORDER — POTASSIUM CHLORIDE CRYS ER 10 MEQ PO TBCR: 10.0000 meq | EXTENDED_RELEASE_TABLET | Freq: Two times a day (BID) | ORAL | 0 refills | Status: DC

## 2017-03-06 MED FILL — POTASSIUM CL ER 10 MEQ TAB: 10 | 10 days supply | Qty: 20 | Fill #0

## 2017-03-13 ENCOUNTER — Other Ambulatory Visit (INDEPENDENT_AMBULATORY_CARE_PROVIDER_SITE_OTHER): Payer: 59

## 2017-03-13 ENCOUNTER — Other Ambulatory Visit: Payer: Self-pay | Admitting: Family Medicine

## 2017-03-13 DIAGNOSIS — E876 Hypokalemia: Secondary | ICD-10-CM | POA: Diagnosis not present

## 2017-03-13 LAB — BASIC METABOLIC PANEL
BUN: 12 mg/dL (ref 6–23)
CALCIUM: 8.8 mg/dL (ref 8.4–10.5)
CO2: 25 mEq/L (ref 19–32)
CREATININE: 0.8 mg/dL (ref 0.40–1.20)
Chloride: 102 mEq/L (ref 96–112)
GFR: 79.84 mL/min (ref >60.00)
Glucose, Bld: 106 mg/dL — ABNORMAL HIGH (ref 70–99)
Potassium: 3.5 mEq/L (ref 3.5–5.1)
SODIUM: 136 meq/L (ref 135–145)

## 2017-03-13 MED ORDER — POTASSIUM CHLORIDE CRYS ER 10 MEQ PO TBCR: 20.0000 meq | EXTENDED_RELEASE_TABLET | Freq: Every day | ORAL | 1 refills | Status: DC

## 2017-03-18 MED FILL — POTASSIUM CL 10 MEQ TAB SA: 10 | 30 days supply | Qty: 60 | Fill #0

## 2017-03-20 MED FILL — HYDROCHLOROTHIAZIDE 25 MG T: 25 | 30 days supply | Qty: 30 | Fill #1

## 2017-04-03 ENCOUNTER — Other Ambulatory Visit (INDEPENDENT_AMBULATORY_CARE_PROVIDER_SITE_OTHER): Payer: 59

## 2017-04-03 ENCOUNTER — Telehealth: Payer: Self-pay | Admitting: Family Medicine

## 2017-04-03 DIAGNOSIS — E876 Hypokalemia: Secondary | ICD-10-CM | POA: Diagnosis not present

## 2017-04-03 DIAGNOSIS — E538 Deficiency of other specified B group vitamins: Secondary | ICD-10-CM

## 2017-04-03 LAB — BASIC METABOLIC PANEL WITH GFR
BUN: 12 mg/dL (ref 6–23)
CO2: 28 meq/L (ref 19–32)
Calcium: 9.7 mg/dL (ref 8.4–10.5)
Chloride: 104 meq/L (ref 96–112)
Creatinine, Ser: 0.7 mg/dL (ref 0.40–1.20)
GFR: 93.12 mL/min
Glucose, Bld: 103 mg/dL — ABNORMAL HIGH (ref 70–99)
Potassium: 3.6 meq/L (ref 3.5–5.1)
Sodium: 139 meq/L (ref 135–145)

## 2017-04-03 MED ORDER — CYANOCOBALAMIN 1000 MCG/ML IJ SOLN: 1000.0000 ug | INTRAMUSCULAR | 1 refills | Status: DC

## 2017-04-03 NOTE — Addendum Note (Signed)
Addended by: Sharon Seller B on: 04/03/2017 08:53 AM   Modules accepted: Orders

## 2017-04-03 NOTE — Telephone Encounter (Signed)
Patient did not have time for B12 injection today. In the past her daughter has given them to her--please ok to send to pharmacy prescription

## 2017-04-03 NOTE — Telephone Encounter (Signed)
Sent to pharmacy 

## 2017-04-03 NOTE — Telephone Encounter (Signed)
OK 

## 2017-04-15 MED FILL — CYANOCOBALAMIN 1,000 MCG/ML: 1000 | 30 days supply | Qty: 1 | Fill #0

## 2017-04-24 MED FILL — HYDROCHLOROTHIAZIDE 25 MG T: 25 | 30 days supply | Qty: 30 | Fill #2

## 2017-04-24 MED FILL — POTASSIUM CL 10 MEQ TAB SA: 10 | 30 days supply | Qty: 60 | Fill #1

## 2017-05-28 MED FILL — HYDROCHLOROTHIAZIDE 25 MG T: 25 | 30 days supply | Qty: 30 | Fill #3

## 2017-06-19 MED FILL — CYANOCOBALAMIN 1,000 MCG/ML: 1000 | 30 days supply | Qty: 1 | Fill #1

## 2017-06-26 MED FILL — HYDROCHLOROTHIAZIDE 25 MG T: 25 | 30 days supply | Qty: 30 | Fill #4

## 2017-07-22 MED FILL — POTASSIUM CL 10 MEQ TAB SA: 10 | 30 days supply | Qty: 60 | Fill #2

## 2017-07-27 MED FILL — HYDROCHLOROTHIAZIDE 25 MG T: 25 | 30 days supply | Qty: 30 | Fill #5

## 2017-08-18 ENCOUNTER — Telehealth: Payer: Self-pay | Admitting: Family Medicine

## 2017-08-18 NOTE — Telephone Encounter (Signed)
Alecia can get the Cologard if interested. TY.

## 2017-08-18 NOTE — Telephone Encounter (Signed)
Copied from [place] [phone]. Topic: Quick Communication - See Telephone Encounter >> Aug 18, 2017 12:21 PM Rutherford Nail, [place] wrote: CRM for notification. See Telephone encounter for: 08/18/17. Patient would like to know if she could get the alternate test, rather that the colonoscopy? Please advise. CB#: [phone]

## 2017-08-19 NOTE — Telephone Encounter (Signed)
Called left message to call back 

## 2017-08-19 NOTE — Telephone Encounter (Signed)
Pt called back and states insurance does cover Cologuard. Order placed via portal. Letter mailed to pt.

## 2017-08-19 NOTE — Telephone Encounter (Signed)
Spoke with pt. She would like to do the cologuard but she has not verified her insurance benefit yet. She will check with insurance then call us back so we can place the order if it is covered.

## 2017-08-31 ENCOUNTER — Other Ambulatory Visit: Payer: Self-pay | Admitting: Family Medicine

## 2017-08-31 NOTE — Telephone Encounter (Signed)
Per medication documentation, pt. needs OV before additional refills given. Author phoned pt. To notify, left detailed VM to call back to make an appointment [phone].

## 2017-09-03 ENCOUNTER — Ambulatory Visit: Payer: 59 | Admitting: Family Medicine

## 2017-09-03 ENCOUNTER — Encounter: Payer: Self-pay | Admitting: Family Medicine

## 2017-09-03 VITALS — BP 132/78 | HR 76 | Temp 98.3°F | Ht 62.0 in | Wt 196.0 lb

## 2017-09-03 DIAGNOSIS — I10 Essential (primary) hypertension: Secondary | ICD-10-CM

## 2017-09-03 DIAGNOSIS — E538 Deficiency of other specified B group vitamins: Secondary | ICD-10-CM | POA: Diagnosis not present

## 2017-09-03 MED ORDER — HYDROCHLOROTHIAZIDE 25 MG PO TABS: 25.0000 mg | ORAL_TABLET | Freq: Every day | ORAL | 5 refills | Status: DC

## 2017-09-03 MED ORDER — POTASSIUM CHLORIDE CRYS ER 10 MEQ PO TBCR: 20.0000 meq | EXTENDED_RELEASE_TABLET | Freq: Every day | ORAL | 5 refills | Status: DC

## 2017-09-03 MED FILL — HYDROCHLOROTHIAZIDE 25 MG T: 25 | 30 days supply | Qty: 30 | Fill #0

## 2017-09-03 MED FILL — POTASSIUM CHL ER M10 TABLET: 10 | 30 days supply | Qty: 60 | Fill #0

## 2017-09-03 NOTE — Patient Instructions (Addendum)
Try to keep the diet clean.  Stay physically active.  Try to keep up with B12 injections monthly.   Let us know if you need anything.

## 2017-09-03 NOTE — Progress Notes (Signed)
Chief Complaint  Patient presents with  . Follow-up    Subjective Stephanie Castro is a 52 y.o. female who presents for hypertension follow up. She does not monitor home blood pressures. She is compliant with medication- HCTZ 25 mg/d. Patient has these side effects of medication: none She is not adhering to a healthy diet overall. Current exercise: none currently  Hx of B12 def, currently taking injections monthly. Has not been compliant, but does notice a difference with her energy levels when she does not take it. Her daughter is a Marine scientist, lives down the road and will administer it for her.   Past Medical History:  Diagnosis Date  . Arthritis    both knees , back and elbows  . Dysplastic nevus    beneath r eye  . Exercise-induced asthma   . GERD (gastroesophageal reflux disease)   . Hypertension   . Primary localized osteoarthritis of right knee 10/13/2014  . Squamous cell carcinoma, face 02/24/2014  . Tear of medial meniscus of right knee 10/13/2014    Review of Systems Cardiovascular: no chest pain Respiratory:  no shortness of breath  Exam BP 132/78 (BP Location: Left Arm, Patient Position: Sitting, Cuff Size: Large)   Pulse 76   Temp 98.3 F (36.8 C) (Oral)   Ht 5\' 2"  (1.575 m)   Wt 196 lb (88.9 kg)   SpO2 96%   BMI 35.85 kg/m  General:  well developed, well nourished, in no apparent distress Heart: RRR, no bruits, + nonpitting b/l LE edema Lungs: clear to auscultation, no accessory muscle use Psych: well oriented with normal range of affect and appropriate judgment/insight  Essential hypertension - Plan: hydrochlorothiazide (HYDRODIURIL) 25 MG tablet, potassium chloride (KLOR-CON M10) 10 MEQ tablet  Vitamin B12 deficiency without anemia  Orders as above. Counseled on diet and exercise. Needs to get back on the wagon. Be compliant with B12 injections.  F/u in 6 mo for CPE. The patient voiced understanding and agreement to the plan.  [place],  DO 09/03/17  7:37 AM

## 2017-09-03 NOTE — Progress Notes (Signed)
Pre visit review using our clinic review tool, if applicable. No additional management support is needed unless otherwise documented below in the visit note. 

## 2017-10-05 MED FILL — HYDROCHLOROTHIAZIDE 25 MG T: 25 | 30 days supply | Qty: 30 | Fill #1

## 2017-10-12 ENCOUNTER — Ambulatory Visit: Payer: 59 | Admitting: Family

## 2017-10-12 ENCOUNTER — Encounter: Payer: Self-pay | Admitting: Family

## 2017-10-12 ENCOUNTER — Ambulatory Visit (HOSPITAL_BASED_OUTPATIENT_CLINIC_OR_DEPARTMENT_OTHER)
Admission: RE | Admit: 2017-10-12 | Discharge: 2017-10-12 | Disposition: A | Discharge status: Home / Self Care | Payer: 59 | Source: Ambulatory Visit | Attending: Family | Admitting: Family

## 2017-10-12 VITALS — BP 100/70 | HR 55 | Temp 98.3°F | Resp 18 | Ht 62.0 in | Wt 201.2 lb

## 2017-10-12 DIAGNOSIS — M549 Dorsalgia, unspecified: Secondary | ICD-10-CM

## 2017-10-12 DIAGNOSIS — M5136 Other intervertebral disc degeneration, lumbar region: Secondary | ICD-10-CM | POA: Diagnosis not present

## 2017-10-12 DIAGNOSIS — M545 Low back pain: Secondary | ICD-10-CM | POA: Diagnosis not present

## 2017-10-12 DIAGNOSIS — M546 Pain in thoracic spine: Secondary | ICD-10-CM | POA: Diagnosis not present

## 2017-10-12 MED ORDER — MELOXICAM 7.5 MG PO TABS: 7.5000 mg | ORAL_TABLET | Freq: Every day | ORAL | 0 refills | Status: DC

## 2017-10-12 MED FILL — MELOXICAM 7.5 MG TABLET: 7.5 | 14 days supply | Qty: 14 | Fill #0

## 2017-10-12 NOTE — Progress Notes (Signed)
Subjective:    Patient ID: Stephanie Castro, female    DOB: [birth year], 52 y.o.   MRN: [id number]  HPI  Stephanie Castro is a 52 yr old female who presents today with chief complaint of back pain.  Woke up Friday AM "sore throughout my torso."  Reports that her coworker "popped my back."  She developed a tenderness in her mid back. Laying down seems to worsen the pain.  Hurts to breath. She has tried ibuprofen with some improvement.  Also using heating pad and ice pack.     Review of Systems    see HPI  Past Medical History:  Diagnosis Date  . Arthritis    both knees , back and elbows  . Dysplastic nevus    beneath r eye  . Exercise-induced asthma   . GERD (gastroesophageal reflux disease)   . Hypertension   . Primary localized osteoarthritis of right knee 10/13/2014  . Squamous cell carcinoma, face 02/24/2014  . Tear of medial meniscus of right knee 10/13/2014     Social History   Socioeconomic History  . Marital status: Married    Spouse name: Not on file  . Number of children: Not on file  . Years of education: Not on file  . Highest education level: Not on file  Occupational History  . Not on file  Social Needs  . Financial resource strain: Not on file  . Food insecurity:    Worry: Not on file    Inability: Not on file  . Transportation needs:    Medical: Not on file    Non-medical: Not on file  Tobacco Use  . Smoking status: Former Research scientist (life sciences)  . Smokeless tobacco: Never Used  . Tobacco comment: Quit in 2005  Substance and Sexual Activity  . Alcohol use: Yes    Alcohol/week: 0.0 standard drinks    Comment: social  . Drug use: No  . Sexual activity: Yes    Birth control/protection: Surgical  Lifestyle  . Physical activity:    Days per week: Not on file    Minutes per session: Not on file  . Stress: Not on file  Relationships  . Social connections:    Talks on phone: Not on file    Gets together: Not on file    Attends religious service: Not on file    Active  member of club or organization: Not on file    Attends meetings of clubs or organizations: Not on file    Relationship status: Not on file  . Intimate partner violence:    Fear of current or ex partner: Not on file    Emotionally abused: Not on file    Physically abused: Not on file    Forced sexual activity: Not on file  Other Topics Concern  . Not on file  Social History Narrative   Married to [place].     Past Surgical History:  Procedure Laterality Date  . ABDOMINAL HYSTERECTOMY  2007  . KNEE ARTHROSCOPY WITH MEDIAL MENISECTOMY Right 10/13/2014   Procedure: RIGHT KNEE ARTHROSCOPY WITH PARTIAL MEDIAL MENISECTOMY AND CHONDROPLASTY ;  Surgeon: Marchia Bond, MD;  Location: [place];  Service: Orthopedics;  Laterality: Right;    Family History  Problem Relation Age of Onset  . Hyperlipidemia Mother   . Breast cancer Mother 29       breast  . Heart attack Father   . COPD Father   . Heart attack Sister   . Hyperlipidemia Sister   .  Hypertension Sister   . Hyperlipidemia Maternal Aunt   . Breast cancer Maternal Aunt        breast  . Hypertension Maternal Uncle   . Diabetes Maternal Uncle   . Colon cancer Maternal Uncle        colon  . Hyperlipidemia Maternal Grandmother   . Diabetes Maternal Grandmother   . Breast cancer Maternal Grandmother        breast  . Diabetes Maternal Grandfather   . Sudden death Neg Hx     Allergies  Allergen Reactions  . Penicillins     Current Outpatient Medications on File Prior to Visit  Medication Sig Dispense Refill  . albuterol (PROVENTIL HFA;VENTOLIN HFA) 108 (90 Base) MCG/ACT inhaler Inhale 1-2 puffs into the lungs every 6 (six) hours as needed for wheezing or shortness of breath. 1 Inhaler 0  . cyanocobalamin (,VITAMIN B-12,) 1000 MCG/ML injection Inject 1 mL (1,000 mcg total) into the muscle every 30 (thirty) days. 10 mL 1  . hydrochlorothiazide (HYDRODIURIL) 25 MG tablet Take 1 tablet (25 mg total) by mouth daily.  Needs office visit prior to anymore refills. 30 tablet 5  . potassium chloride (KLOR-CON M10) 10 MEQ tablet Take 2 tablets (20 mEq total) by mouth daily. 60 tablet 5   No current facility-administered medications on file prior to visit.     BP 100/70 (BP Location: Right Arm, Cuff Size: Large)   Pulse (!) 55   Temp 98.3 F (36.8 C) (Oral)   Resp 18   Ht 5\' 2"  (1.575 m)   Wt 201 lb 3.2 oz (91.3 kg)   SpO2 98%   BMI 36.80 kg/m    Objective:   Physical Exam  Constitutional: She is oriented to person, place, and time. She appears well-developed and well-nourished. No distress.  Cardiovascular: Normal rate, regular rhythm, S1 normal and normal heart sounds.  Musculoskeletal:       Cervical back: She exhibits no tenderness.       Thoracic back: She exhibits tenderness.       Lumbar back: She exhibits no tenderness.  Neurological: She is alert and oriented to person, place, and time.  Reflex Scores:      Patellar reflexes are 2+ on the right side and 2+ on the left side. Bilateral LE strength 5/5  Skin: Skin is warm and dry.          Assessment & Plan:  Back pain- will obtain x-rays to ensure no fracture. Rx with meloxicam. Pt is advised to call if new/worsening symptoms or if pain is not improved in 2 weeks.

## 2017-10-12 NOTE — Patient Instructions (Signed)
Complete x-rays on the first floor. Begin meloxicam for back pain. Call if symptoms worsen or if not improved in the next 2 weeks.

## 2017-10-19 DIAGNOSIS — Z1212 Encounter for screening for malignant neoplasm of rectum: Secondary | ICD-10-CM | POA: Diagnosis not present

## 2017-10-19 DIAGNOSIS — Z1211 Encounter for screening for malignant neoplasm of colon: Secondary | ICD-10-CM | POA: Diagnosis not present

## 2017-10-19 LAB — COLOGUARD: Cologuard: NEGATIVE

## 2017-10-20 ENCOUNTER — Telehealth: Payer: Self-pay | Admitting: *Deleted

## 2017-10-20 NOTE — Telephone Encounter (Signed)
Received Cologuard Order Cancellation: [id number]; order has been changed to Suspended for Inactivity, the order will be reactivated if patient returns their sample w/i 365 days of the Initial order. [facility] has made several attempts to reach patient by phone and letter unsuccessfully; forwarded to provider/SLS 09/24

## 2017-10-21 ENCOUNTER — Telehealth: Payer: Self-pay | Admitting: Family Medicine

## 2017-10-21 NOTE — Telephone Encounter (Signed)
[facility] contacted [place] stating she did not complete the Cologard. Can we please see if this is something she is interested in doing? TY.

## 2017-10-26 NOTE — Telephone Encounter (Signed)
Received Cologuard results were Negative and called the patient to inform.

## 2017-10-27 ENCOUNTER — Telehealth: Payer: Self-pay | Admitting: *Deleted

## 2017-10-27 NOTE — Telephone Encounter (Signed)
Received Cologuard Results; forwarded to provider/SLS  10/01

## 2017-11-03 MED FILL — HYDROCHLOROTHIAZIDE 25 MG T: 25 | 30 days supply | Qty: 30 | Fill #2

## 2017-12-02 MED FILL — POTASSIUM CHL ER M10 TABLET: 10 | 30 days supply | Qty: 60 | Fill #1

## 2017-12-02 MED FILL — HYDROCHLOROTHIAZIDE 25 MG T: 25 | 30 days supply | Qty: 30 | Fill #3

## 2017-12-30 ENCOUNTER — Ambulatory Visit: Payer: 59 | Admitting: Medical

## 2017-12-30 ENCOUNTER — Encounter: Payer: Self-pay | Admitting: Medical

## 2017-12-30 VITALS — BP 127/74 | HR 48 | Temp 98.6°F | Resp 16 | Ht 62.0 in | Wt 200.4 lb

## 2017-12-30 DIAGNOSIS — R300 Dysuria: Secondary | ICD-10-CM

## 2017-12-30 DIAGNOSIS — R319 Hematuria, unspecified: Secondary | ICD-10-CM

## 2017-12-30 DIAGNOSIS — N390 Urinary tract infection, site not specified: Secondary | ICD-10-CM

## 2017-12-30 LAB — POC URINALSYSI DIPSTICK (AUTOMATED)
Bilirubin, UA: NEGATIVE
Glucose, UA: NEGATIVE
Ketones, UA: NEGATIVE
LEUKOCYTES UA: NEGATIVE
Nitrite, UA: NEGATIVE
PH UA: 6 (ref 5.0–8.0)
PROTEIN UA: NEGATIVE
SPEC GRAV UA: 1.02 (ref 1.010–1.025)
UROBILINOGEN UA: NEGATIVE U/dL — AB

## 2017-12-30 MED ORDER — NITROFURANTOIN MONOHYD MACRO 100 MG PO CAPS: 100.0000 mg | ORAL_CAPSULE | Freq: Two times a day (BID) | ORAL | 0 refills | Status: DC

## 2017-12-30 MED FILL — NITROFURANTOIN MONO-MCR 100: 100 | 7 days supply | Qty: 14 | Fill #0

## 2017-12-30 NOTE — Progress Notes (Signed)
Subjective:    Patient ID: Stephanie Castro, female    DOB: 1965/04/27, 52 y.o.   MRN: [id number]  HPI  Pt in states yesterday morning she felt uti symptoms.  Pt in today reporting urinary symptoms.  Dysuria- x 1 day Frequent urination-1 day Hesitancy-some Suprapubic pressure-some yesterday Fever-no chills-no Nausea-no Vomiting-no CVA pain-no History of UTI-pt states when younger had a lot of infections. But not recently Gross hematuria-no  Pt hydrated a lot yesterday and symptoms much less now. Bladder still feels irritated. Burning resolved.  On review of prior ua trace blood for 4 years.  Pt has remote hx of smoking. Stopped at 52 years old.   Review of Systems  Constitutional: Negative for chills, fatigue and fever.  Respiratory: Negative for cough, chest tightness, shortness of breath and wheezing.   Cardiovascular: Negative for chest pain and palpitations.  Gastrointestinal: Negative for abdominal pain.  Genitourinary: Positive for dysuria, frequency and urgency. Negative for hematuria and vaginal pain.  Musculoskeletal: Negative for back pain.  Skin: Negative for rash.  Hematological: Negative for adenopathy. Does not bruise/bleed easily.  Psychiatric/Behavioral: Negative for confusion.    Past Medical History:  Diagnosis Date  . Arthritis    both knees , back and elbows  . Dysplastic nevus    beneath r eye  . Exercise-induced asthma   . GERD (gastroesophageal reflux disease)   . Hypertension   . Primary localized osteoarthritis of right knee 10/13/2014  . Squamous cell carcinoma, face 02/24/2014  . Tear of medial meniscus of right knee 10/13/2014     Social History   Socioeconomic History  . Marital status: Married    Spouse name: Not on file  . Number of children: Not on file  . Years of education: Not on file  . Highest education level: Not on file  Occupational History  . Not on file  Social Needs  . Financial resource strain: Not on file  .  Food insecurity:    Worry: Not on file    Inability: Not on file  . Transportation needs:    Medical: Not on file    Non-medical: Not on file  Tobacco Use  . Smoking status: Former Research scientist (life sciences)  . Smokeless tobacco: Never Used  . Tobacco comment: Quit in 2005  Substance and Sexual Activity  . Alcohol use: Yes    Alcohol/week: 0.0 standard drinks    Comment: social  . Drug use: No  . Sexual activity: Yes    Birth control/protection: Surgical  Lifestyle  . Physical activity:    Days per week: Not on file    Minutes per session: Not on file  . Stress: Not on file  Relationships  . Social connections:    Talks on phone: Not on file    Gets together: Not on file    Attends religious service: Not on file    Active member of club or organization: Not on file    Attends meetings of clubs or organizations: Not on file    Relationship status: Not on file  . Intimate partner violence:    Fear of current or ex partner: Not on file    Emotionally abused: Not on file    Physically abused: Not on file    Forced sexual activity: Not on file  Other Topics Concern  . Not on file  Social History Narrative   Married to [place].     Past Surgical History:  Procedure Laterality Date  . ABDOMINAL HYSTERECTOMY  2007  . KNEE ARTHROSCOPY WITH MEDIAL MENISECTOMY Right 10/13/2014   Procedure: RIGHT KNEE ARTHROSCOPY WITH PARTIAL MEDIAL MENISECTOMY AND CHONDROPLASTY ;  Surgeon: Marchia Bond, MD;  Location: [facility];  Service: Orthopedics;  Laterality: Right;    Family History  Problem Relation Age of Onset  . Hyperlipidemia Mother   . Breast cancer Mother 52       breast  . Heart attack Father   . COPD Father   . Heart attack Sister   . Hyperlipidemia Sister   . Hypertension Sister   . Hyperlipidemia Maternal Aunt   . Breast cancer Maternal Aunt        breast  . Hypertension Maternal Uncle   . Diabetes Maternal Uncle   . Colon cancer Maternal Uncle        colon  .  Hyperlipidemia Maternal Grandmother   . Diabetes Maternal Grandmother   . Breast cancer Maternal Grandmother        breast  . Diabetes Maternal Grandfather   . Sudden death Neg Hx     Allergies  Allergen Reactions  . Penicillins     Current Outpatient Medications on File Prior to Visit  Medication Sig Dispense Refill  . albuterol (PROVENTIL HFA;VENTOLIN HFA) 108 (90 Base) MCG/ACT inhaler Inhale 1-2 puffs into the lungs every 6 (six) hours as needed for wheezing or shortness of breath. 1 Inhaler 0  . cyanocobalamin (,VITAMIN B-12,) 1000 MCG/ML injection Inject 1 mL (1,000 mcg total) into the muscle every 30 (thirty) days. 10 mL 1  . hydrochlorothiazide (HYDRODIURIL) 25 MG tablet Take 1 tablet (25 mg total) by mouth daily. Needs office visit prior to anymore refills. 30 tablet 5  . meloxicam (MOBIC) 7.5 MG tablet Take 1 tablet (7.5 mg total) by mouth daily. 14 tablet 0  . potassium chloride (KLOR-CON M10) 10 MEQ tablet Take 2 tablets (20 mEq total) by mouth daily. 60 tablet 5   No current facility-administered medications on file prior to visit.     BP 127/74   Pulse (!) 48   Temp 98.6 F (37 C) (Oral)   Resp 16   Ht 5\' 2"  (1.575 m)   Wt 200 lb 6.4 oz (90.9 kg)   SpO2 100%   BMI 36.65 kg/m       Objective:   Physical Exam  General Appearance- Not in acute distress.    Chest and Lung Exam Auscultation: Breath sounds:-Normal. Adventitious sounds:- No Adventitious sounds.  Cardiovascular Auscultation:Rythm - Regular. Heart Sounds -Normal heart sounds.  Abdomen Inspection:-Inspection Normal.  Palpation/Perucssion: Palpation and Percussion of the abdomen reveal- Non Tender, No Rebound tenderness, No rigidity(Guarding) and No Palpable abdominal masses.  Liver:-Normal.  Spleen:- Normal.   Back- no cva tenderness.      Assessment & Plan:  Your appear to have a urinary tract infection. I am prescribing macrobid antibiotic for the probable infection. Hydrate well. I  am sending out a urine culture. During the interim if your signs and symptoms worsen rather than improving please notify us. We will notify your when the culture results are back.  You do have history of trace blood on previous urine samples over the past 4 years.  So I do want you to repeat urinalysis in 10 days.  He has blood shows up again despite being on antibiotics then would refer you to a urologist for further evaluation.  If asymptomatic then just doing urine studies in 10 days will be adequate.  However if around the time  you have any urinary symptoms then would asked that you be seen.  Mackie Pai, PA-C

## 2017-12-30 NOTE — Patient Instructions (Signed)
Your appear to have a urinary tract infection. I am prescribing macrobid antibiotic for the probable infection. Hydrate well. I am sending out a urine culture. During the interim if your signs and symptoms worsen rather than improving please notify us. We will notify your when the culture results are back.  You do have history of trace blood on previous urine samples over the past 4 years.  So I do want you to repeat urinalysis in 10 days.  He has blood shows up again despite being on antibiotics then would refer you to a urologist for further evaluation.  If asymptomatic then just doing urine studies in 10 days will be adequate.  However if around the time you have any urinary symptoms then would asked that you be seen.

## 2017-12-31 LAB — URINE CULTURE
MICRO NUMBER: 91451916
SPECIMEN QUALITY: ADEQUATE

## 2018-01-04 MED FILL — HYDROCHLOROTHIAZIDE 25 MG T: 25 | 30 days supply | Qty: 30 | Fill #4

## 2018-01-11 ENCOUNTER — Other Ambulatory Visit (INDEPENDENT_AMBULATORY_CARE_PROVIDER_SITE_OTHER): Payer: 59

## 2018-01-11 ENCOUNTER — Telehealth: Payer: Self-pay | Admitting: Medical

## 2018-01-11 DIAGNOSIS — R319 Hematuria, unspecified: Secondary | ICD-10-CM | POA: Diagnosis not present

## 2018-01-11 LAB — URINALYSIS, ROUTINE W REFLEX MICROSCOPIC
Bilirubin Urine: NEGATIVE
Ketones, ur: NEGATIVE
LEUKOCYTES UA: NEGATIVE
Nitrite: NEGATIVE
SPECIFIC GRAVITY, URINE: 1.02 (ref 1.000–1.030)
Total Protein, Urine: NEGATIVE
Urine Glucose: NEGATIVE
Urobilinogen, UA: 0.2 (ref 0.0–1.0)
pH: 6 (ref 5.0–8.0)

## 2018-01-11 NOTE — Telephone Encounter (Signed)
Opened to review 

## 2018-02-03 ENCOUNTER — Encounter: Payer: Self-pay | Admitting: Medical

## 2018-02-03 ENCOUNTER — Ambulatory Visit: Payer: 59 | Admitting: Internal Medicine

## 2018-02-03 ENCOUNTER — Ambulatory Visit: Payer: 59 | Admitting: Medical

## 2018-02-03 VITALS — HR 62 | Temp 98.4°F | Resp 16 | Ht 62.0 in

## 2018-02-03 DIAGNOSIS — R300 Dysuria: Secondary | ICD-10-CM | POA: Diagnosis not present

## 2018-02-03 DIAGNOSIS — R350 Frequency of micturition: Secondary | ICD-10-CM

## 2018-02-03 LAB — POC URINALSYSI DIPSTICK (AUTOMATED)
Bilirubin, UA: NEGATIVE
Glucose, UA: NEGATIVE
Ketones, UA: NEGATIVE
Leukocytes, UA: NEGATIVE
Nitrite, UA: NEGATIVE
Protein, UA: NEGATIVE
Spec Grav, UA: >=1.03 — AB (ref 1.010–1.025)
Urobilinogen, UA: NEGATIVE U/dL — AB
pH, UA: 6 (ref 5.0–8.0)

## 2018-02-03 MED ORDER — CIPROFLOXACIN HCL 250 MG PO TABS: 250.0000 mg | ORAL_TABLET | Freq: Two times a day (BID) | ORAL | 0 refills | Status: DC

## 2018-02-03 MED ORDER — PHENAZOPYRIDINE HCL 200 MG PO TABS: 200.0000 mg | ORAL_TABLET | Freq: Three times a day (TID) | ORAL | 0 refills | Status: DC | PRN

## 2018-02-03 MED FILL — CIPROFLOXACIN HCL 250 MG TA: 250 | 3 days supply | Qty: 6 | Fill #0

## 2018-02-03 NOTE — Patient Instructions (Addendum)
You do have some recurrent UTI symptoms again.  In the past your former cultures came back showing minimal low counts of bacteria.  However symptomatically you did improve with antibiotics.  You had some blood on prior UAs.  We will get culture of today's urine and place you on Cipro 2050 mg tabs milligram tabs for 3 days.  Will follow culture and let you know the result.  You do have appointment upcoming with urologist at the end of this month.  They may evaluate/work you up for possible interstitial cystitis if they are not convinced that you are having UTIs.  Also they can do a test to evaluate your residual volume in your bladder.  Prescription of Pyridium given for pain.  Follow-up date as needed pending urine culture review and whether your symptoms resolved.

## 2018-02-03 NOTE — Progress Notes (Signed)
Subjective:    Patient ID: Stephanie Castro, female    DOB: 04/01/1965, 52 y.o.   MRN: [id number]  HPI  Pt in today reporting urinary symptoms on and off symptoms since Friday.  Dysuria- yes Frequent urination-yes Hesitancy-yes but due to pain.  Feels like not emptying her bladder. Suprapubic pressure-yes. Fever-no chills-no Nausea-no  Vomiting-no CVA pain- no. History of UTI- Gross hematuria-no Hx of some blood in urine in past.  Last time pt seemed to get better after only 3-4 days of antibiotic.  Pt has appointment with urologist [address] or [address].     Review of Systems  Constitutional: Negative for chills, fatigue and fever.  Respiratory: Negative for cough, chest tightness, shortness of breath and wheezing.   Cardiovascular: Negative for chest pain and palpitations.  Gastrointestinal: Negative for abdominal pain.  Genitourinary: Positive for dysuria, frequency and urgency. Negative for difficulty urinating.       Feels like bladder is not emptying  Musculoskeletal: Negative for back pain.  Skin: Negative for pallor.  Neurological: Negative for dizziness, tremors, weakness and headaches.  Hematological: Negative for adenopathy. Does not bruise/bleed easily.  Psychiatric/Behavioral: Negative for behavioral problems and confusion.    Past Medical History:  Diagnosis Date  . Arthritis    both knees , back and elbows  . Dysplastic nevus    beneath r eye  . Exercise-induced asthma   . GERD (gastroesophageal reflux disease)   . Hypertension   . Primary localized osteoarthritis of right knee 10/13/2014  . Squamous cell carcinoma, face 02/24/2014  . Tear of medial meniscus of right knee 10/13/2014     Social History   Socioeconomic History  . Marital status: Married    Spouse name: Not on file  . Number of children: Not on file  . Years of education: Not on file  . Highest education level: Not on file  Occupational History  . Not on file  Social Needs  .  Financial resource strain: Not on file  . Food insecurity:    Worry: Not on file    Inability: Not on file  . Transportation needs:    Medical: Not on file    Non-medical: Not on file  Tobacco Use  . Smoking status: Former Research scientist (life sciences)  . Smokeless tobacco: Never Used  . Tobacco comment: Quit in 2005  Substance and Sexual Activity  . Alcohol use: Yes    Alcohol/week: 0.0 standard drinks    Comment: social  . Drug use: No  . Sexual activity: Yes    Birth control/protection: Surgical  Lifestyle  . Physical activity:    Days per week: Not on file    Minutes per session: Not on file  . Stress: Not on file  Relationships  . Social connections:    Talks on phone: Not on file    Gets together: Not on file    Attends religious service: Not on file    Active member of club or organization: Not on file    Attends meetings of clubs or organizations: Not on file    Relationship status: Not on file  . Intimate partner violence:    Fear of current or ex partner: Not on file    Emotionally abused: Not on file    Physically abused: Not on file    Forced sexual activity: Not on file  Other Topics Concern  . Not on file  Social History Narrative   Married to [place].     Past Surgical  History:  Procedure Laterality Date  . ABDOMINAL HYSTERECTOMY  2007  . KNEE ARTHROSCOPY WITH MEDIAL MENISECTOMY Right 10/13/2014   Procedure: RIGHT KNEE ARTHROSCOPY WITH PARTIAL MEDIAL MENISECTOMY AND CHONDROPLASTY ;  Surgeon: Marchia Bond, MD;  Location: [facility];  Service: Orthopedics;  Laterality: Right;    Family History  Problem Relation Age of Onset  . Hyperlipidemia Mother   . Breast cancer Mother 73       breast  . Heart attack Father   . COPD Father   . Heart attack Sister   . Hyperlipidemia Sister   . Hypertension Sister   . Hyperlipidemia Maternal Aunt   . Breast cancer Maternal Aunt        breast  . Hypertension Maternal Uncle   . Diabetes Maternal Uncle   . Colon  cancer Maternal Uncle        colon  . Hyperlipidemia Maternal Grandmother   . Diabetes Maternal Grandmother   . Breast cancer Maternal Grandmother        breast  . Diabetes Maternal Grandfather   . Sudden death Neg Hx     Allergies  Allergen Reactions  . Penicillins     Current Outpatient Medications on File Prior to Visit  Medication Sig Dispense Refill  . albuterol (PROVENTIL HFA;VENTOLIN HFA) 108 (90 Base) MCG/ACT inhaler Inhale 1-2 puffs into the lungs every 6 (six) hours as needed for wheezing or shortness of breath. 1 Inhaler 0  . cyanocobalamin (,VITAMIN B-12,) 1000 MCG/ML injection Inject 1 mL (1,000 mcg total) into the muscle every 30 (thirty) days. 10 mL 1  . hydrochlorothiazide (HYDRODIURIL) 25 MG tablet Take 1 tablet (25 mg total) by mouth daily. Needs office visit prior to anymore refills. 30 tablet 5  . meloxicam (MOBIC) 7.5 MG tablet Take 1 tablet (7.5 mg total) by mouth daily. 14 tablet 0  . nitrofurantoin, macrocrystal-monohydrate, (MACROBID) 100 MG capsule Take 1 capsule (100 mg total) by mouth 2 (two) times daily. 14 capsule 0  . potassium chloride (KLOR-CON M10) 10 MEQ tablet Take 2 tablets (20 mEq total) by mouth daily. 60 tablet 5   No current facility-administered medications on file prior to visit.     Pulse 62   Temp 98.4 F (36.9 C) (Oral)   Resp 16   Ht 5\' 2"  (1.575 m)   SpO2 100%   BMI 36.65 kg/m       Objective:   Physical Exam  General- No acute distress. Pleasant patient. Neck- Full range of motion, no jvd Lungs- Clear, even and unlabored. Heart- regular rate and rhythm. Neurologic- CNII- XII grossly intact.  Abdomen- soft, mild tender over bladder, non-distended, +bs, no rebound or guarding.  Back- no cva tenderness.      Assessment & Plan:  You do have some recurrent UTI symptoms again.  In the past your former cultures came back showing minimal low counts of bacteria.  However symptomatically you did improve with antibiotics.   You had some blood on prior UAs.  We will get culture of today's urine and place you on Cipro 2050 mg tabs milligram tabs for 3 days.  Will follow culture and let you know the result.  You do have appointment upcoming with urologist at the end of this month.  They may evaluate/work you up for possible interstitial cystitis if they are not convinced that you are having UTIs.  Also they can do a test to evaluate your residual volume in your bladder.  Prescription of Pyridium  given for pain.  Follow-up date as needed pending urine culture review and whether your symptoms resolved.  Mackie Pai, PA-C

## 2018-02-04 MED FILL — HYDROCHLOROTHIAZIDE 25 MG T: 25 | 30 days supply | Qty: 30 | Fill #5

## 2018-03-09 ENCOUNTER — Other Ambulatory Visit: Payer: Self-pay | Admitting: Family Medicine

## 2018-03-09 DIAGNOSIS — I10 Essential (primary) hypertension: Secondary | ICD-10-CM

## 2018-03-10 MED FILL — HYDROCHLOROTHIAZIDE 25 MG T: 25 | 30 days supply | Qty: 30 | Fill #0

## 2018-03-14 IMAGING — CR DG CHEST 2V
2 series · 2 of 2 positions shown · non-contrast
Comparison: 08/08/2015

CLINICAL DATA: Shortness of breath, chest discomfort for 2 days.

EXAM:
CHEST  2 VIEW

[w chest pa]
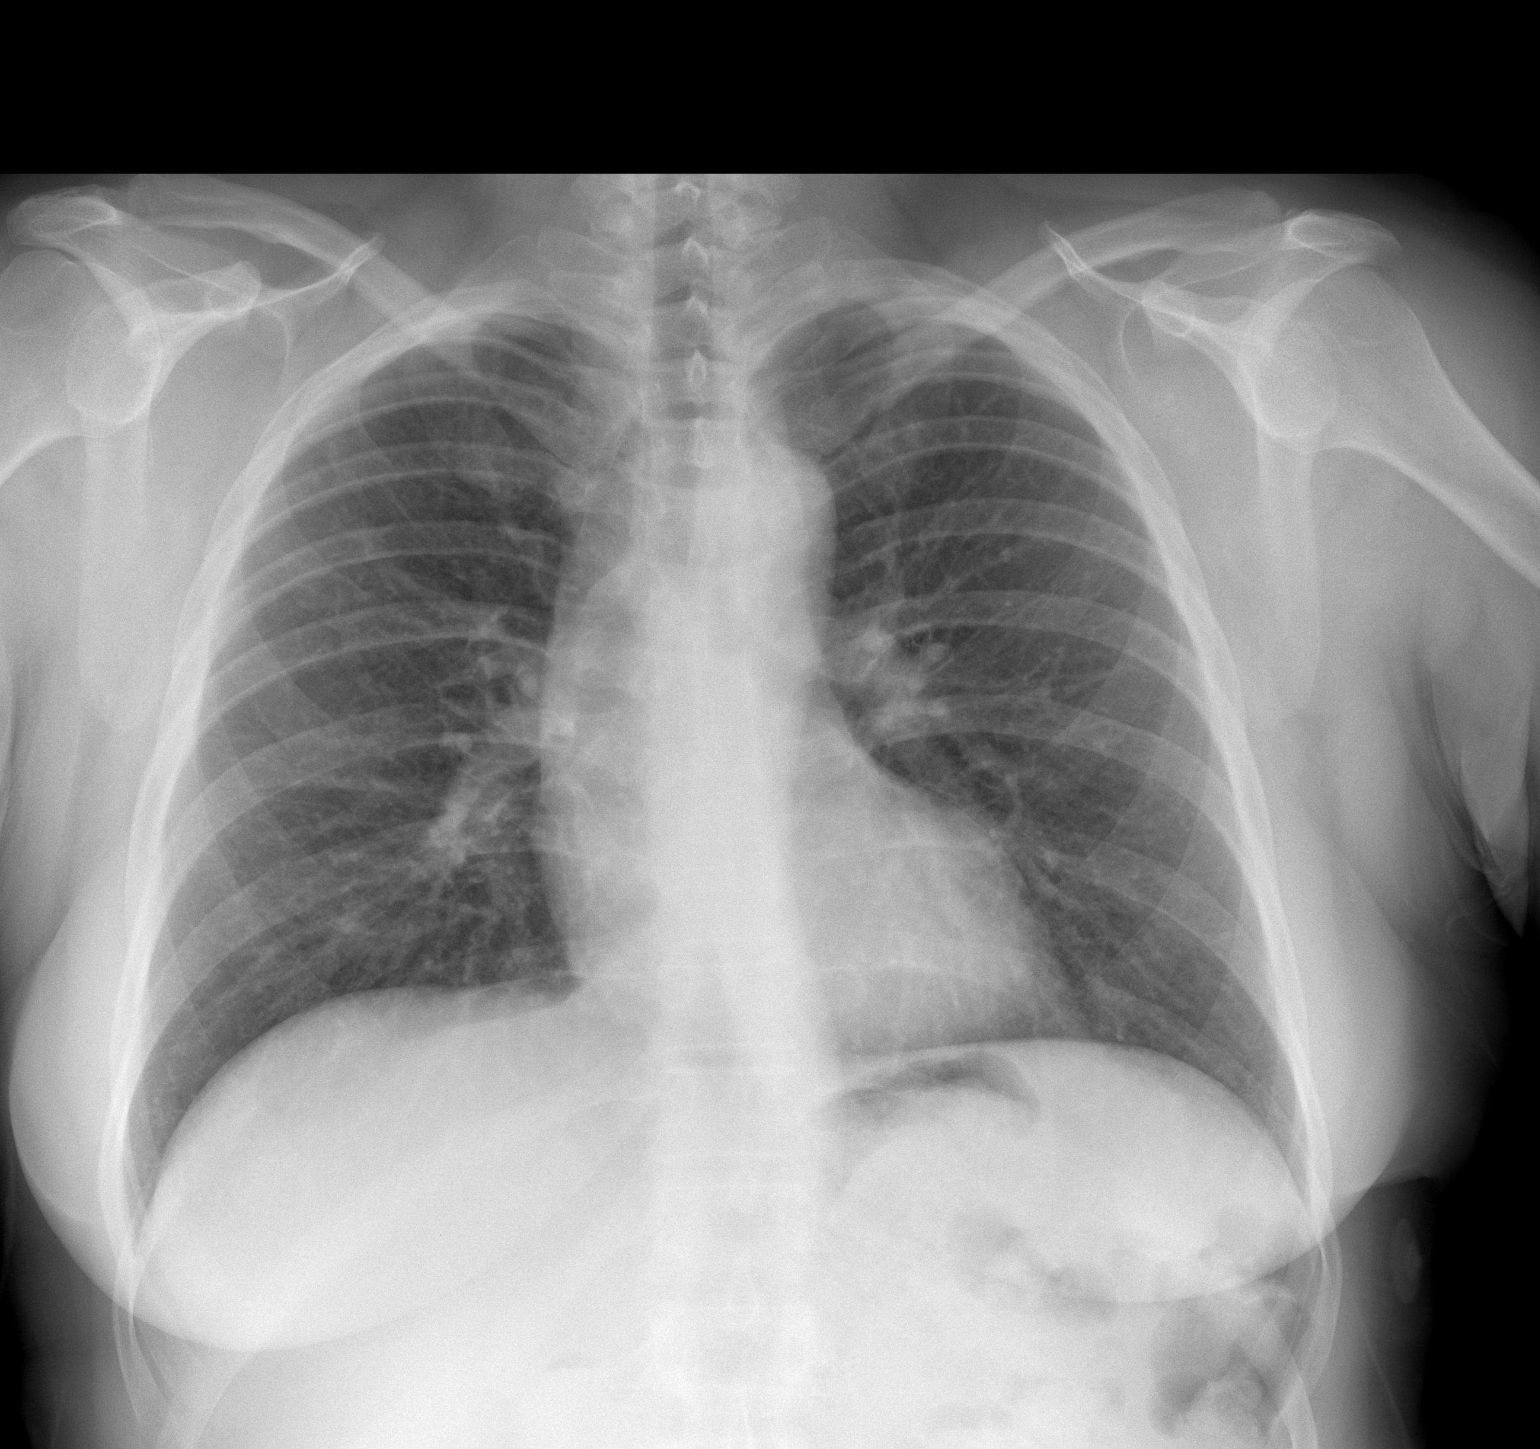

[w chest lat]
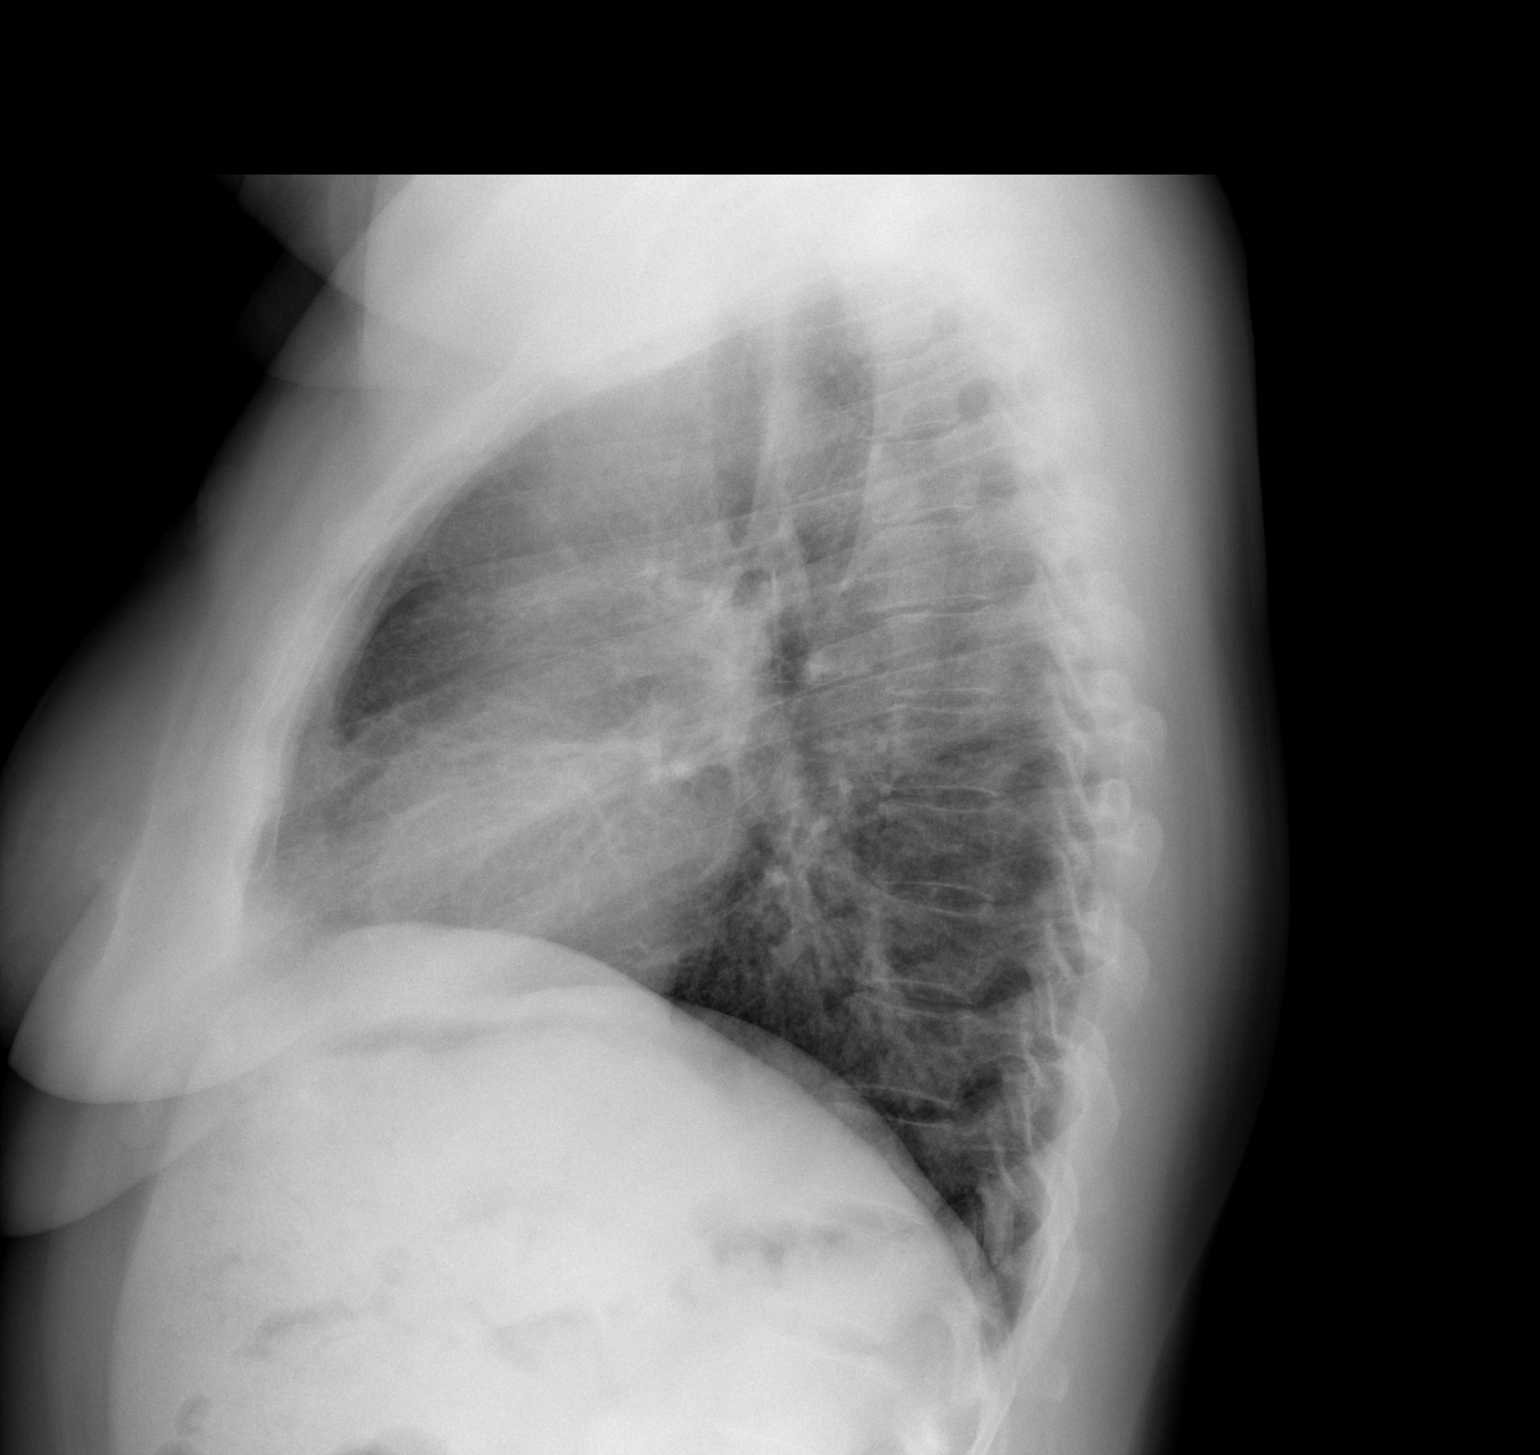

[2 of 2 positions shown; findings below may reference images not displayed]

FINDINGS: Heart and mediastinal contours are within normal limits. No focal
opacities or effusions. No acute bony abnormality.
IMPRESSION: No active cardiopulmonary disease.

## 2018-03-17 DIAGNOSIS — Z711 Person with feared health complaint in whom no diagnosis is made: Secondary | ICD-10-CM

## 2018-03-17 HISTORY — DX: Person with feared health complaint in whom no diagnosis is made: Z71.1

## 2018-03-17 NOTE — Progress Notes (Signed)
[facility] at [facility] [address], [place], [place] [address] [phone]  Date:  03/18/2018   Name:  Stephanie Castro   DOB:  05-23-1965   MRN:  [id number]  PCP:  [name], DO    Chief Complaint: Breast Pain (right breast pain, sore spot, noticied 2 days ago, no discharge or discoloration, family hx of breast cancer)   History of Present Illness:  Stephanie Castro is a 53 y.o. very pleasant female patient who presents with the following:  Patient of Dr. Nani Ravens here today with a breast concern.  She asked to see me today as she would prefer to discuss this with a female History of hypertension, TIA, hyperlipidemia It appears that her last mammogram was in May 2017- pt confirms this  She notes a sore spot on her right breast- has been present for 48 hours or so  Her mother had breast cancer, also aunts and 2 cousins No fever or other symptoms   Patient Active Problem List   Diagnosis Date Noted  . Concern about female breast disease without diagnosis 03/17/2018  . Essential hypertension 02/26/2017  . Exercise-induced asthma   . DOE (dyspnea on exertion) 08/08/2015  . Elevated BP without diagnosis of hypertension 06/15/2015  . Weight gain 04/24/2015  . Primary localized osteoarthritis of right knee 10/13/2014  . Squamous cell carcinoma, face 02/24/2014  . Vitamin D deficiency 02/24/2014  . Fatigue 07/06/2013  . GERD (gastroesophageal reflux disease) 07/06/2013  . Vitamin B12 deficiency without anemia 05/15/2013  . BMI 35.0-35.9,adult 05/15/2013  . Perimenopausal 05/15/2013  . Low TSH level 04/22/2013  . Breast cancer screening, high risk patient 04/04/2013  . Periodic edema 04/04/2013  . Other and unspecified hyperlipidemia 04/06/2011    Past Medical History:  Diagnosis Date  . Arthritis    both knees , back and elbows  . Dysplastic nevus    beneath r eye  . Exercise-induced asthma   . GERD  (gastroesophageal reflux disease)   . Hypertension   . Primary localized osteoarthritis of right knee 10/13/2014  . Squamous cell carcinoma, face 02/24/2014  . Tear of medial meniscus of right knee 10/13/2014    Past Surgical History:  Procedure Laterality Date  . ABDOMINAL HYSTERECTOMY  2007  . KNEE ARTHROSCOPY WITH MEDIAL MENISECTOMY Right 10/13/2014   Procedure: RIGHT KNEE ARTHROSCOPY WITH PARTIAL MEDIAL MENISECTOMY AND CHONDROPLASTY ;  Surgeon: Marchia Bond, MD;  Location: [facility];  Service: Orthopedics;  Laterality: Right;    Social History   Tobacco Use  . Smoking status: Former Research scientist (life sciences)  . Smokeless tobacco: Never Used  . Tobacco comment: Quit in 2005  Substance Use Topics  . Alcohol use: Yes    Alcohol/week: 0.0 standard drinks    Comment: social  . Drug use: No    Family History  Problem Relation Age of Onset  . Hyperlipidemia Mother   . Breast cancer Mother 7       breast  . Heart attack Father   . COPD Father   . Heart attack Sister   . Hyperlipidemia Sister   . Hypertension Sister   . Hyperlipidemia Maternal Aunt   . Breast cancer Maternal Aunt        breast  . Hypertension Maternal Uncle   . Diabetes Maternal Uncle   . Colon cancer Maternal Uncle        colon  . Hyperlipidemia Maternal Grandmother   .  Diabetes Maternal Grandmother   . Breast cancer Maternal Grandmother        breast  . Diabetes Maternal Grandfather   . Sudden death Neg Hx     Allergies  Allergen Reactions  . Penicillins     Medication list has been reviewed and updated.  Current Outpatient Medications on File Prior to Visit  Medication Sig Dispense Refill  . albuterol (PROVENTIL HFA;VENTOLIN HFA) 108 (90 Base) MCG/ACT inhaler Inhale 1-2 puffs into the lungs every 6 (six) hours as needed for wheezing or shortness of breath. 1 Inhaler 0  . cyanocobalamin (,VITAMIN B-12,) 1000 MCG/ML injection Inject 1 mL (1,000 mcg total) into the muscle every 30 (thirty) days. 10  mL 1  . hydrochlorothiazide (HYDRODIURIL) 25 MG tablet TAKE 1 TABLET BY MOUTH ONCE DAILY. NEED OFFICE VISIT PRIOR TO ANYMORE REFILLS 30 tablet 5  . meloxicam (MOBIC) 7.5 MG tablet Take 1 tablet (7.5 mg total) by mouth daily. 14 tablet 0  . potassium chloride (KLOR-CON M10) 10 MEQ tablet Take 2 tablets (20 mEq total) by mouth daily. 60 tablet 5   No current facility-administered medications on file prior to visit.     Review of Systems:  As per HPI- otherwise negative. No fever or chills, otherwise feeling well  Physical Examination: Vitals:   03/18/18 1026  BP: 122/80  Pulse: 64  Resp: 16  Temp: 98.5 F (36.9 C)  SpO2: 98%   Vitals:   03/18/18 1026  Weight: 205 lb (93 kg)  Height: 5\' 2"  (1.575 m)   Body mass index is 37.49 kg/m. Ideal Body Weight: Weight in (lb) to have BMI = 25: 136.4  GEN: WDWN, NAD, Non-toxic, A & O x 3, obese, looks well HEENT: Atraumatic, Normocephalic. Neck supple. No masses, No LAD. Ears and Nose: No external deformity. CV: RRR, No M/G/R. No JVD. No thrill. No extra heart sounds. PULM: CTA B, no wheezes, crackles, rhonchi. No retractions. No resp. distress. No accessory muscle use. ABD: S, NT, ND Breast exam: Normal bilaterally.  No discharge, dimpling, masses.  The patient notes mild tenderness at the medial right breast, at 3:00.  There is no skin change or lesion.  I also checked along the dermatome for any skin lesions on her side or back.  None are present EXTR: No c/c/e NEURO Normal gait.  PSYCH: Normally interactive. Conversant. Not depressed or anxious appearing.  Calm demeanor. '  Assessment and Plan: Breast pain, right - Plan: valACYclovir (VALTREX) 1000 MG tablet  Concern about female breast disease without diagnosis - Plan: MM DIAG BREAST TOMO BILATERAL, US BREAST LTD UNI RIGHT INC AXILLA  Here today with pain in her right breast.  I have set her up for a diagnostic mammogram and ultrasound if necessary.  We also discussed the  possibility of shingles, I gave her prescription for Valtrex to hold.  She can start this if any rash develops  Signed Lamar Blinks, MD

## 2018-03-18 ENCOUNTER — Ambulatory Visit: Payer: 59 | Admitting: Family Medicine

## 2018-03-18 ENCOUNTER — Encounter: Payer: Self-pay | Admitting: Family Medicine

## 2018-03-18 VITALS — BP 122/80 | HR 64 | Temp 98.5°F | Resp 16 | Ht 62.0 in | Wt 205.0 lb

## 2018-03-18 DIAGNOSIS — N644 Mastodynia: Secondary | ICD-10-CM

## 2018-03-18 DIAGNOSIS — Z711 Person with feared health complaint in whom no diagnosis is made: Secondary | ICD-10-CM | POA: Diagnosis not present

## 2018-03-18 MED ORDER — VALACYCLOVIR HCL 1 G PO TABS: 1000.0000 mg | ORAL_TABLET | Freq: Three times a day (TID) | ORAL | 0 refills | Status: DC

## 2018-03-18 NOTE — Patient Instructions (Addendum)
It was nice to see you today!  I ordered a diagnostic mammogram and right breast ultrasound, to be done at your [facility]  Address: [address], [place], [place] [address] Phone: [phone]  They should call you, but also feel free to call them in the next couple of days to set up your appt As we discussed, shingles is another consideration. If you have any rash or other skin finding please start the valtrex rx and contact me

## 2018-03-23 DIAGNOSIS — N3941 Urge incontinence: Secondary | ICD-10-CM | POA: Diagnosis not present

## 2018-03-23 DIAGNOSIS — R8271 Bacteriuria: Secondary | ICD-10-CM | POA: Diagnosis not present

## 2018-03-23 DIAGNOSIS — R350 Frequency of micturition: Secondary | ICD-10-CM | POA: Diagnosis not present

## 2018-03-23 DIAGNOSIS — R3121 Asymptomatic microscopic hematuria: Secondary | ICD-10-CM | POA: Diagnosis not present

## 2018-04-05 DIAGNOSIS — N289 Disorder of kidney and ureter, unspecified: Secondary | ICD-10-CM | POA: Diagnosis not present

## 2018-04-05 DIAGNOSIS — R3121 Asymptomatic microscopic hematuria: Secondary | ICD-10-CM | POA: Diagnosis not present

## 2018-04-07 MED FILL — HYDROCHLOROTHIAZIDE 25 MG T: 25 | 30 days supply | Qty: 30 | Fill #1

## 2018-04-13 DIAGNOSIS — R3121 Asymptomatic microscopic hematuria: Secondary | ICD-10-CM | POA: Diagnosis not present

## 2018-04-13 DIAGNOSIS — N3941 Urge incontinence: Secondary | ICD-10-CM | POA: Diagnosis not present

## 2018-04-15 ENCOUNTER — Encounter: Payer: Self-pay | Admitting: Family Medicine

## 2018-04-15 MED FILL — TOVIAZ ER 8 MG TABLET: 8 | 30 days supply | Qty: 30 | Fill #0

## 2018-04-16 DIAGNOSIS — Z711 Person with feared health complaint in whom no diagnosis is made: Secondary | ICD-10-CM | POA: Diagnosis not present

## 2018-04-16 DIAGNOSIS — N644 Mastodynia: Secondary | ICD-10-CM | POA: Diagnosis not present

## 2018-05-12 MED FILL — HYDROCHLOROTHIAZIDE 25 MG T: 25 | 30 days supply | Qty: 30 | Fill #2

## 2018-05-28 ENCOUNTER — Ambulatory Visit (INDEPENDENT_AMBULATORY_CARE_PROVIDER_SITE_OTHER): Payer: 59 | Admitting: Family Medicine

## 2018-05-28 ENCOUNTER — Encounter: Payer: Self-pay | Admitting: Family Medicine

## 2018-05-28 DIAGNOSIS — M25552 Pain in left hip: Secondary | ICD-10-CM | POA: Diagnosis not present

## 2018-05-28 MED ORDER — TRAMADOL HCL 50 MG PO TABS: 50.0000 mg | ORAL_TABLET | Freq: Two times a day (BID) | ORAL | 0 refills | Status: AC | PRN

## 2018-05-28 MED ORDER — PREDNISONE 20 MG PO TABS: 40.0000 mg | ORAL_TABLET | Freq: Every day | ORAL | 0 refills | Status: AC

## 2018-05-28 MED FILL — traMADol HCL 50 MG TABS: 50 | 7 days supply | Qty: 15 | Fill #0

## 2018-05-28 MED FILL — predniSONE 20 MG TABS: 20 | 5 days supply | Qty: 10 | Fill #0

## 2018-05-28 NOTE — Progress Notes (Signed)
Musculoskeletal Exam  Patient: Stephanie Castro DOB: [birth year]  DOS: 05/28/2018  SUBJECTIVE:  Chief Complaint:   Chief Complaint  Patient presents with  . Hip Pain    Stephanie Castro is a 53 y.o.  female for evaluation and treatment of hip pain. Due to COVID-19 pandemic, we are interacting via web portal for an electronic face-to-face visit. I verified patient's ID using 2 identifiers. Patient agreed to proceed with visit via this method. Patient is at home, I am at office. Patient and I are present for visit.    Onset:  1 day ago. No inj or change in activity.  Location: outer L hip Character:  aching and throbbing  Progression of issue:  is unchanged Associated symptoms: difficulty walking Stretching helps it. Walking makes it worse Treatment: to date has been ice, OTC NSAIDS and heat.   Neurovascular symptoms: no  ROS: Musculoskeletal/Extremities: +hip pain  Past Medical History:  Diagnosis Date  . Arthritis    both knees , back and elbows  . Dysplastic nevus    beneath r eye  . Exercise-induced asthma   . GERD (gastroesophageal reflux disease)   . Hypertension   . Primary localized osteoarthritis of right knee 10/13/2014  . Squamous cell carcinoma, face 02/24/2014  . Tear of medial meniscus of right knee 10/13/2014    Objective: No conversational dyspnea Age appropriate judgment and insight Nml affect and mood Points to outside of hip/greater troch area  Assessment:  Pain of left hip joint - Plan: traMADol (ULTRAM) 50 MG tablet, predniSONE (DELTASONE) 20 MG tablet  Plan: Orders as above. Stretches/exercises, pred burst, tramadol fo breakthrough pain, ice, heat. Warning signs and symptoms of tramadol verbalized and written down in message.  F/u prn in person if no better. The patient voiced understanding and agreement to the plan.   [place], DO 05/28/18  10:43 AM

## 2018-06-04 ENCOUNTER — Other Ambulatory Visit: Payer: Self-pay | Admitting: Family Medicine

## 2018-06-04 ENCOUNTER — Encounter: Payer: Self-pay | Admitting: Family Medicine

## 2018-06-04 MED ORDER — MELOXICAM 15 MG PO TABS: 15.0000 mg | ORAL_TABLET | Freq: Every day | ORAL | 0 refills | Status: DC

## 2018-06-04 MED FILL — MELOXICAM 15 MG TABLET: 15 | 30 days supply | Qty: 30 | Fill #0

## 2018-06-04 NOTE — Progress Notes (Signed)
NSAID called in.

## 2018-06-07 ENCOUNTER — Ambulatory Visit: Payer: 59 | Admitting: Family Medicine

## 2018-06-07 ENCOUNTER — Other Ambulatory Visit: Payer: Self-pay

## 2018-06-07 ENCOUNTER — Encounter: Payer: Self-pay | Admitting: Family Medicine

## 2018-06-07 VITALS — BP 128/78 | HR 55 | Temp 98.2°F | Ht 61.5 in | Wt 204.0 lb

## 2018-06-07 DIAGNOSIS — M7062 Trochanteric bursitis, left hip: Secondary | ICD-10-CM | POA: Diagnosis not present

## 2018-06-07 MED ORDER — METHYLPREDNISOLONE ACETATE 40 MG/ML IJ SUSP
40.0000 mg | Freq: Once | INTRAMUSCULAR | Status: AC
  Administered 2018-06-07: 40 mg via INTRA_ARTICULAR

## 2018-06-07 NOTE — Addendum Note (Signed)
Addended by: Sharon Seller B on: 06/07/2018 08:40 AM   Modules accepted: Orders

## 2018-06-07 NOTE — Patient Instructions (Signed)
Ice/cold pack over area for 10-15 min twice daily.  Continue the Mobic daily.  OK to take Tylenol 1000 mg (2 extra strength tabs) or 975 mg (3 regular strength tabs) every 6 hours as needed.  Iliotibial Band Syndrome Rehab It is normal to feel mild stretching, pulling, tightness, or discomfort as you do these exercises, but you should stop right away if you feel sudden pain or your pain gets worse.  Stretching and range of motion exercises These exercises warm up your muscles and joints and improve the movement and flexibility of your hip and pelvis. Exercise A: Quadriceps, prone    1. Lie on your abdomen on a firm surface, such as a bed or padded floor. 2. Bend your left / right knee and hold your ankle. If you cannot reach your ankle or pant leg, loop a belt around your foot and grab the belt instead. 3. Gently pull your heel toward your buttocks. Your knee should not slide out to the side. You should feel a stretch in the front of your thigh and knee. 4. Hold this position for 30 seconds. Repeat 2 times. Complete this stretch 3 times per week. Exercise B: Iliotibial band    1. Lie on your side with your left / right leg in the top position. 2. Bend both of your knees and grab your left / right ankle. Stretch out your bottom arm to help you balance. 3. Slowly bring your top knee back so your thigh goes behind your trunk. 4. Slowly lower your top leg toward the floor until you feel a gentle stretch on the outside of your left / right hip and thigh. If you do not feel a stretch and your knee will not fall farther, place the heel of your other foot on top of your knee and pull your knee down toward the floor with your foot. 5. Hold this position for 30 seconds. Repeat 2 times. Complete this stretch 3 times per week. Strengthening exercises These exercises build strength and endurance in your hip and pelvis. Endurance is the ability to use your muscles for a long time, even after they get  tired. Exercise C: Straight leg raises (hip abductors)     1. Lie on your side with your left / right leg in the top position. Lie so your head, shoulder, knee, and hip line up. You may bend your bottom knee to help you balance. 2. Roll your hips slightly forward so your hips are stacked directly over each other and your left / right knee is facing forward. 3. Tense the muscles in your outer thigh and lift your top leg 4-6 inches (10-15 cm). 4. Hold this position for 3 seconds. Repeat for a total of 10 reps. 5. Slowly return to the starting position. Let your muscles relax completely before doing another repetition. Repeat 2 times. Complete this exercise 3 times per week. Exercise D: Straight leg raises (hip extensors) 1. Lie on your abdomen on your bed or a firm surface. You can put a pillow under your hips if that is more comfortable. 2. Bend your left / right knee so your foot is straight up in the air. 3. Squeeze your buttock muscles and lift your left / right thigh off the bed. Do not let your back arch. 4. Tense this muscle as hard as you can without increasing any knee pain. 5. Hold this position for 2 seconds. Repeat for a total of 10 reps 6. Slowly lower your leg to  the starting position and allow it to relax completely. Repeat 2 times. Complete this exercise 3 times per week. Exercise E: Hip hike 1. Stand sideways on a bottom step. Stand on your left / right leg with your other foot unsupported next to the step. You can hold onto the railing or wall if needed for balance. 2. Keep your knees straight and your torso square. Then, lift your left / right hip up toward the ceiling. 3. Slowly let your left / right hip lower toward the floor, past the starting position. Your foot should get closer to the floor. Do not lean or bend your knees. Repeat 2 times. Complete this exercise 3 times per week.  Document Released: 01/13/2005 Document Revised: 09/18/2015 Document Reviewed: 12/15/2014  Elsevier Interactive Patient Education  [employer].

## 2018-06-07 NOTE — Progress Notes (Signed)
Musculoskeletal Exam  Patient: Stephanie Castro DOB: 1965/04/21  DOS: 06/07/2018  SUBJECTIVE:  Chief Complaint:   Chief Complaint  Patient presents with  . Hip Pain    Stephanie Castro is a 53 y.o.  female for evaluation and treatment of L hip pain.   Onset:  12 days ago. No inj or change in activity.  Location: outer L hip Character:  aching and throbbing  Progression of issue:  is unchanged Associated symptoms: difficulty walking Treatment: to date has been rest, ice, OTC NSAIDS, prescription NSAIDS, oral steroids and heat.   Neurovascular symptoms: no  ROS: Musculoskeletal/Extremities: +hip pain  Past Medical History:  Diagnosis Date  . Arthritis    both knees , back and elbows  . Dysplastic nevus    beneath r eye  . Exercise-induced asthma   . GERD (gastroesophageal reflux disease)   . Hypertension   . Primary localized osteoarthritis of right knee 10/13/2014  . Squamous cell carcinoma, face 02/24/2014  . Tear of medial meniscus of right knee 10/13/2014    Objective: VITAL SIGNS: BP 128/78 (BP Location: Left Arm, Patient Position: Sitting, Cuff Size: Normal)   Pulse (!) 55   Temp 98.2 F (36.8 C) (Oral)   Ht 5' 1.5" (1.562 m)   Wt 204 lb (92.5 kg)   SpO2 98%   BMI 37.92 kg/m  Constitutional: Well formed, well developed. No acute distress. Thorax & Lungs: No accessory muscle use Musculoskeletal: L hip.   Normal active range of motion: yes.   Normal passive range of motion: no Tenderness to palpation: yes; over greater troch Deformity: no Ecchymosis: no Tests positive: None Tests negative: Ober's, Stinchfield, log roll, FABER, FADDIR Neurologic: Normal sensory function. Antalgic gait Psychiatric: Normal mood. Age appropriate judgment and insight. Alert & oriented x 3.    Procedure note: Greater trochanteric bursa injection; left Verbal consent obtained. The area of interest was palpated and demarcated with an otoscope speculum. It was cleaned with an  alcohol swab. Freeze spray was used. A 27 g needle was inserted at a perpendicular angle through the area of interested. The plunger was withdrawn to ensure our placement was not in a vessel. 2 mL of 1% lidocaine without epi and 40 mg of depomedrol was injected. A bandaid was placed. The patient tolerated the procedure well.  There were no complications noted.   Assessment:  Greater trochanteric bursitis of left hip - Plan: PR DRAIN/INJECT LARGE JOINT/BURSA  Plan: Orders as above. Cont NSAID, Tylenol, ice, stretches/exercises for IT band. F/u prn. The patient voiced understanding and agreement to the plan.   [place], DO 06/07/18  8:18 AM

## 2018-06-16 MED FILL — HYDROCHLOROTHIAZIDE 25 MG T: 25 | 30 days supply | Qty: 30 | Fill #3

## 2018-07-13 MED FILL — HYDROCHLOROTHIAZIDE 25 MG T: 25 | 30 days supply | Qty: 30 | Fill #4

## 2018-08-05 ENCOUNTER — Encounter: Payer: Self-pay | Admitting: Family Medicine

## 2018-08-06 ENCOUNTER — Other Ambulatory Visit: Payer: Self-pay

## 2018-08-06 ENCOUNTER — Ambulatory Visit: Payer: Self-pay

## 2018-08-06 ENCOUNTER — Ambulatory Visit: Payer: 59 | Admitting: Family Medicine

## 2018-08-06 ENCOUNTER — Encounter: Payer: Self-pay | Admitting: Family Medicine

## 2018-08-06 DIAGNOSIS — M7712 Lateral epicondylitis, left elbow: Secondary | ICD-10-CM

## 2018-08-06 DIAGNOSIS — M7711 Lateral epicondylitis, right elbow: Secondary | ICD-10-CM

## 2018-08-06 HISTORY — DX: Lateral epicondylitis, right elbow: M77.11

## 2018-08-06 MED ORDER — PENNSAID 2 % TD SOLN: 1.0000 "application " | Freq: Two times a day (BID) | TRANSDERMAL | 3 refills | Status: DC

## 2018-08-06 MED ORDER — METHYLPREDNISOLONE ACETATE 40 MG/ML IJ SUSP
40.0000 mg | Freq: Once | INTRAMUSCULAR | Status: AC
  Administered 2018-08-06: 40 mg via INTRA_ARTICULAR

## 2018-08-06 NOTE — Patient Instructions (Signed)
Nice to meet you Please try the rub on medicine  Please try ice  Please try the exercises   Please send me a message in [place] with any questions or updates.  Please see me back in 4 weeks.   --Dr. Raeford Razor

## 2018-08-06 NOTE — Assessment & Plan Note (Signed)
Pain seems most consistent with lateral epidondylitis. History of it in both elbows. Has failed conservative measures thus far.  - injections today  - counseled on HEP and supportive care - if no improvement consider nitro patches or PT.

## 2018-08-06 NOTE — Progress Notes (Signed)
Stephanie Castro - 53 y.o. female MRN [id number]  Date of birth: 1965/12/11  SUBJECTIVE:  Including CC & ROS.  Chief Complaint  Patient presents with  . Elbow Pain    bilateral elbow    Stephanie Castro is a 53 y.o. female that is presenting with bilateral elbow pain.  It is occurring over the lateral epicondyle on the left and right.  The left is worse than the right side.  She feels it first initiated when she was working on her tiny house.  It seems to be staying the same in severity.  It is mild to moderate.  It is becoming constant.  She denies any trauma or prior surgery.  Worse when she is trying to pick things up.  Has tried medications with limited improvement.  She has a history of similar symptoms about 3 years ago.   Review of Systems  Constitutional: Negative for fever.  HENT: Negative for congestion.   Respiratory: Negative for cough.   Cardiovascular: Negative for chest pain.  Gastrointestinal: Negative for abdominal distention.  Musculoskeletal: Negative for gait problem.  Skin: Negative for color change.  Neurological: Negative for weakness.  Hematological: Negative for adenopathy.    HISTORY: Past Medical, Surgical, Social, and Family History Reviewed & Updated per EMR.   Pertinent Historical Findings include:  Past Medical History:  Diagnosis Date  . Arthritis    both knees , back and elbows  . Dysplastic nevus    beneath r eye  . Exercise-induced asthma   . GERD (gastroesophageal reflux disease)   . Hypertension   . Primary localized osteoarthritis of right knee 10/13/2014  . Squamous cell carcinoma, face 02/24/2014  . Tear of medial meniscus of right knee 10/13/2014    Past Surgical History:  Procedure Laterality Date  . ABDOMINAL HYSTERECTOMY  2007  . KNEE ARTHROSCOPY WITH MEDIAL MENISECTOMY Right 10/13/2014   Procedure: RIGHT KNEE ARTHROSCOPY WITH PARTIAL MEDIAL MENISECTOMY AND CHONDROPLASTY ;  Surgeon: Marchia Bond, MD;  Location: [facility];  Service: Orthopedics;  Laterality: Right;    Allergies  Allergen Reactions  . Penicillins     Family History  Problem Relation Age of Onset  . Hyperlipidemia Mother   . Breast cancer Mother 102       breast  . Heart attack Father   . COPD Father   . Heart attack Sister   . Hyperlipidemia Sister   . Hypertension Sister   . Hyperlipidemia Maternal Aunt   . Breast cancer Maternal Aunt        breast  . Hypertension Maternal Uncle   . Diabetes Maternal Uncle   . Colon cancer Maternal Uncle        colon  . Hyperlipidemia Maternal Grandmother   . Diabetes Maternal Grandmother   . Breast cancer Maternal Grandmother        breast  . Diabetes Maternal Grandfather   . Sudden death Neg Hx      Social History   Socioeconomic History  . Marital status: Married    Spouse name: Not on file  . Number of children: Not on file  . Years of education: Not on file  . Highest education level: Not on file  Occupational History  . Not on file  Social Needs  . Financial resource strain: Not on file  . Food insecurity    Worry: Not on file    Inability: Not on file  . Transportation needs    Medical: Not on  file    Non-medical: Not on file  Tobacco Use  . Smoking status: Former Research scientist (life sciences)  . Smokeless tobacco: Never Used  . Tobacco comment: Quit in 2005  Substance and Sexual Activity  . Alcohol use: Yes    Alcohol/week: 0.0 standard drinks    Comment: social  . Drug use: No  . Sexual activity: Yes    Birth control/protection: Surgical  Lifestyle  . Physical activity    Days per week: Not on file    Minutes per session: Not on file  . Stress: Not on file  Relationships  . Social Herbalist on phone: Not on file    Gets together: Not on file    Attends religious service: Not on file    Active member of club or organization: Not on file    Attends meetings of clubs or organizations: Not on file    Relationship status: Not on file  . Intimate partner  violence    Fear of current or ex partner: Not on file    Emotionally abused: Not on file    Physically abused: Not on file    Forced sexual activity: Not on file  Other Topics Concern  . Not on file  Social History Narrative   Married to [place].      PHYSICAL EXAM:  VS: BP 133/78   Pulse (!) 57   Ht 5\' 2"  (1.575 m)   Wt 203 lb (92.1 kg)   BMI 37.13 kg/m  Physical Exam Gen: NAD, alert, cooperative with exam, well-appearing ENT: normal lips, normal nasal mucosa,  Eye: normal EOM, normal conjunctiva and lids CV:  no edema, +2 pedal pulses   Resp: no accessory muscle use, non-labored,  Skin: no rashes, no areas of induration  Neuro: normal tone, normal sensation to touch Psych:  normal insight, alert and oriented MSK:  Left and Right elbow:  No effusion or ecchymosis. Normal elbow range of motion. Normal pronation and supination. Normal grip strength. Tenderness palpation over the lateral epicondyle bilaterally. No significant pain with middle finger extension to resistance bilaterally. Neurovascular intact   Aspiration/Injection Procedure Note Stephanie Castro [birth year]  Procedure: Injection Indications: Right lateral epicondylitis  Procedure Details Consent: Risks of procedure as well as the alternatives and risks of each were explained to the (patient/caregiver).  Consent for procedure obtained. Time Out: Verified patient identification, verified procedure, site/side was marked, verified correct patient position, special equipment/implants available, medications/allergies/relevent history reviewed, required imaging and test results available.  Performed.  The area was cleaned with iodine and alcohol swabs.    The right lateral epicondyle was injected using 1 cc's of 40 mg Depo-Medrol and 2 cc's of 0.25 % bupivacaine with a 25 1 1/2" needle.  Ultrasound was used. Images were obtained in long views showing the injection.     A sterile dressing was applied.  Patient  did tolerate procedure well.    Aspiration/Injection Procedure Note Stephanie Castro 09-24-1965  Procedure: Injection Indications: Left lateral epicondylitis  Procedure Details Consent: Risks of procedure as well as the alternatives and risks of each were explained to the (patient/caregiver).  Consent for procedure obtained. Time Out: Verified patient identification, verified procedure, site/side was marked, verified correct patient position, special equipment/implants available, medications/allergies/relevent history reviewed, required imaging and test results available.  Performed.  The area was cleaned with iodine and alcohol swabs.    The left lateral epicondylitis was injected using 1 cc's of 40 mg Depo-Medrol and 2 cc's  of 0.25 % bupivacaine with a 25 1 1/2" needle.  Ultrasound was used. Images were obtained in long views showing the injection.     A sterile dressing was applied.  Patient did tolerate procedure well.      ASSESSMENT & PLAN:   Lateral epicondylitis of both elbows Pain seems most consistent with lateral epidondylitis. History of it in both elbows. Has failed conservative measures thus far.  - injections today  - counseled on HEP and supportive care - if no improvement consider nitro patches or PT.

## 2018-08-17 MED FILL — HYDROCHLOROTHIAZIDE 25 MG T: 25 | 30 days supply | Qty: 30 | Fill #5

## 2018-09-02 ENCOUNTER — Ambulatory Visit: Payer: 59 | Admitting: Family Medicine

## 2018-09-07 ENCOUNTER — Other Ambulatory Visit: Payer: Self-pay

## 2018-09-07 ENCOUNTER — Encounter: Payer: Self-pay | Admitting: Family Medicine

## 2018-09-07 ENCOUNTER — Ambulatory Visit: Payer: 59 | Admitting: Family Medicine

## 2018-09-07 DIAGNOSIS — M7711 Lateral epicondylitis, right elbow: Secondary | ICD-10-CM | POA: Diagnosis not present

## 2018-09-07 DIAGNOSIS — M7712 Lateral epicondylitis, left elbow: Secondary | ICD-10-CM

## 2018-09-07 NOTE — Progress Notes (Signed)
Stephanie Castro - 53 y.o. female MRN [id number]  Date of birth: 30-Mar-1965  SUBJECTIVE:  Including CC & ROS.  No chief complaint on file.   Stephanie Castro is a 53 y.o. female that is following up for her bilateral elbow pain.  She received injections and reports significant improvement of her pain.  Has tried Voltaren over-the-counter as well as the [place].  Get some relief with this as well.  She has been avoiding maneuvers that exacerbate her pain.  It is minimal to nonexistent at this time..    Review of Systems  Constitutional: Negative for fever.  HENT: Negative for congestion.   Respiratory: Negative for cough.   Cardiovascular: Negative for chest pain.  Gastrointestinal: Negative for abdominal distention.  Musculoskeletal: Negative for back pain.  Neurological: Negative for weakness.  Hematological: Negative for adenopathy.    HISTORY: Past Medical, Surgical, Social, and Family History Reviewed & Updated per EMR.   Pertinent Historical Findings include:  Past Medical History:  Diagnosis Date  . Arthritis    both knees , back and elbows  . Dysplastic nevus    beneath r eye  . Exercise-induced asthma   . GERD (gastroesophageal reflux disease)   . Hypertension   . Primary localized osteoarthritis of right knee 10/13/2014  . Squamous cell carcinoma, face 02/24/2014  . Tear of medial meniscus of right knee 10/13/2014    Past Surgical History:  Procedure Laterality Date  . ABDOMINAL HYSTERECTOMY  2007  . KNEE ARTHROSCOPY WITH MEDIAL MENISECTOMY Right 10/13/2014   Procedure: RIGHT KNEE ARTHROSCOPY WITH PARTIAL MEDIAL MENISECTOMY AND CHONDROPLASTY ;  Surgeon: Marchia Bond, MD;  Location: [facility];  Service: Orthopedics;  Laterality: Right;    Allergies  Allergen Reactions  . Penicillins     Family History  Problem Relation Age of Onset  . Hyperlipidemia Mother   . Breast cancer Mother 81       breast  . Heart attack Father   . COPD Father   .  Heart attack Sister   . Hyperlipidemia Sister   . Hypertension Sister   . Hyperlipidemia Maternal Aunt   . Breast cancer Maternal Aunt        breast  . Hypertension Maternal Uncle   . Diabetes Maternal Uncle   . Colon cancer Maternal Uncle        colon  . Hyperlipidemia Maternal Grandmother   . Diabetes Maternal Grandmother   . Breast cancer Maternal Grandmother        breast  . Diabetes Maternal Grandfather   . Sudden death Neg Hx      Social History   Socioeconomic History  . Marital status: Married    Spouse name: Not on file  . Number of children: Not on file  . Years of education: Not on file  . Highest education level: Not on file  Occupational History  . Not on file  Social Needs  . Financial resource strain: Not on file  . Food insecurity    Worry: Not on file    Inability: Not on file  . Transportation needs    Medical: Not on file    Non-medical: Not on file  Tobacco Use  . Smoking status: Former Research scientist (life sciences)  . Smokeless tobacco: Never Used  . Tobacco comment: Quit in 2005  Substance and Sexual Activity  . Alcohol use: Yes    Alcohol/week: 0.0 standard drinks    Comment: social  . Drug use: No  . Sexual  activity: Yes    Birth control/protection: Surgical  Lifestyle  . Physical activity    Days per week: Not on file    Minutes per session: Not on file  . Stress: Not on file  Relationships  . Social Herbalist on phone: Not on file    Gets together: Not on file    Attends religious service: Not on file    Active member of club or organization: Not on file    Attends meetings of clubs or organizations: Not on file    Relationship status: Not on file  . Intimate partner violence    Fear of current or ex partner: Not on file    Emotionally abused: Not on file    Physically abused: Not on file    Forced sexual activity: Not on file  Other Topics Concern  . Not on file  Social History Narrative   Married to [place].      PHYSICAL EXAM:   VS: There were no vitals taken for this visit. Physical Exam Gen: NAD, alert, cooperative with exam, well-appearing ENT: normal lips, normal nasal mucosa,  Eye: normal EOM, normal conjunctiva and lids CV:  no edema, +2 pedal pulses   Resp: no accessory muscle use, non-labored,   Skin: no rashes, no areas of induration  Neuro: normal tone, normal sensation to touch Psych:  normal insight, alert and oriented MSK:  Left and right elbow: Minimal to no tenderness at the right and left lateral epicondyle No obvious swelling or redness. Normal range of motion. Normal grip strength. Neurovascular intact    ASSESSMENT & PLAN:   No problem-specific Assessment & Plan notes found for this encounter.

## 2018-09-07 NOTE — Patient Instructions (Signed)
Good to see you Please avoid activities that exacerbate your pain  Please try ice on the area if needed  Please try the exercises   Please send me a message in [place] with any questions or updates.  Please see me back as needed.   --Dr. Raeford Razor

## 2018-09-07 NOTE — Assessment & Plan Note (Signed)
Improvement with injections.  Has been avoiding maneuvers that exacerbate her pain. -Counseled on home exercise therapy and supportive care. -Can follow-up as needed.

## 2018-09-16 ENCOUNTER — Other Ambulatory Visit: Payer: Self-pay | Admitting: Family Medicine

## 2018-09-16 DIAGNOSIS — I10 Essential (primary) hypertension: Secondary | ICD-10-CM

## 2018-09-17 MED FILL — HYDROCHLOROTHIAZIDE 25 MG T: 25 | 30 days supply | Qty: 30 | Fill #0

## 2018-10-18 MED FILL — HYDROCHLOROTHIAZIDE 25 MG T: 25 | 30 days supply | Qty: 30 | Fill #1

## 2018-11-16 ENCOUNTER — Other Ambulatory Visit: Payer: Self-pay | Admitting: Family Medicine

## 2018-11-16 DIAGNOSIS — E538 Deficiency of other specified B group vitamins: Secondary | ICD-10-CM

## 2018-11-16 MED FILL — CYANOCOBALAMIN 1,000 MCG/ML: 1000 | 30 days supply | Qty: 1 | Fill #0

## 2018-11-19 ENCOUNTER — Other Ambulatory Visit: Payer: Self-pay

## 2018-11-22 ENCOUNTER — Encounter: Payer: Self-pay | Admitting: Family Medicine

## 2018-11-22 ENCOUNTER — Other Ambulatory Visit: Payer: Self-pay

## 2018-11-22 ENCOUNTER — Ambulatory Visit: Payer: 59 | Admitting: Family Medicine

## 2018-11-22 VITALS — BP 122/84 | HR 83 | Temp 98.0°F | Ht 61.5 in | Wt 198.4 lb

## 2018-11-22 DIAGNOSIS — E559 Vitamin D deficiency, unspecified: Secondary | ICD-10-CM

## 2018-11-22 DIAGNOSIS — E538 Deficiency of other specified B group vitamins: Secondary | ICD-10-CM | POA: Diagnosis not present

## 2018-11-22 DIAGNOSIS — M2550 Pain in unspecified joint: Secondary | ICD-10-CM

## 2018-11-22 LAB — VITAMIN D 25 HYDROXY (VIT D DEFICIENCY, FRACTURES): VITD: 28.41 ng/mL — ABNORMAL LOW (ref 30.00–100.00)

## 2018-11-22 LAB — VITAMIN B12: Vitamin B-12: 152 pg/mL — ABNORMAL LOW (ref 211–911)

## 2018-11-22 MED ORDER — PREDNISONE 20 MG PO TABS: 40.0000 mg | ORAL_TABLET | Freq: Every day | ORAL | 0 refills | Status: AC

## 2018-11-22 MED FILL — HYDROCHLOROTHIAZIDE 25 MG T: 25 | 30 days supply | Qty: 30 | Fill #2

## 2018-11-22 MED FILL — predniSONE 20 MG TABS: 20 | 5 days supply | Qty: 10 | Fill #0

## 2018-11-22 NOTE — Progress Notes (Signed)
Chief Complaint  Patient presents with  . joint pain all over    Subjective: Patient is a 53 y.o. female here for jt pain.  Over the past month, the patient has been experiencing diffuse joint and "bone pain".  No injury or change in activity.  Every time she tries to physically exert herself, several hours later she feels quite a bit of pain.  She has tried ice, heat, and Tylenol with little relief.  She does have a family history of rheumatoid arthritis in her grandfather on mom's side and her brother.  She has 2 sisters who both have lupus.  There is a questionable history of fibromyalgia in her sisters as well.  She is not having overt weakness but does feel like she is losing strength due to pain.  She is not having any vision changes, rashes, bruising, swelling, unexplained weight changes, erythema, or dry mouth.  She has never been worked up for autoimmune conditions.  Interestingly, her hands are not involved.  ROS: MSK: As noted in HPI Endo: No unexplained weight changes  Past Medical History:  Diagnosis Date  . Arthritis    both knees , back and elbows  . Dysplastic nevus    beneath r eye  . Exercise-induced asthma   . GERD (gastroesophageal reflux disease)   . Hypertension   . Primary localized osteoarthritis of right knee 10/13/2014  . Squamous cell carcinoma, face 02/24/2014  . Tear of medial meniscus of right knee 10/13/2014    Objective: BP 122/84 (BP Location: Left Arm, Patient Position: Sitting, Cuff Size: Normal)   Pulse 83   Temp 98 F (36.7 C) (Temporal)   Ht 5' 1.5" (1.562 m)   Wt 198 lb 6 oz (90 kg)   SpO2 95%   BMI 36.88 kg/m  General: Awake, appears stated age HEENT: MMM, EOMi Neuro: DTRs equal and symmetric and without clonus, gait is relatively normal though when she rises from a seated position, the movement is antalgic; 5/5 strength throughout, grip strength is adequate MSK: There is bilateral tenderness to palpation over the paraspinal musculature  and erector spinae muscle group starting at around T7 extending to the lumbar; there are also tender spots over the right trapezius and deltoid; I did not appreciate any edema or tenderness to palpation over the joints of the hands or lower extremities Heart: Brisk capillary refill Lungs: No accessory muscle use Skin: No rashes on exposed surfaces Psych: Age appropriate judgment and insight, normal affect and mood  Assessment and Plan: Arthralgia, unspecified joint - Plan: Antinuclear Antib (ANA), Cyclic citrul peptide antibody, IgG (QUEST), Rheumatoid Factor, Anti-Smith antibody  Low serum vitamin B12 - Plan: B12  Vitamin D deficiency - Plan: Vitamin D (25 hydroxy)  Check very specifically for lupus and rheumatoid arthritis given her high deductible.  We will also trial a prednisone burst to see how her symptoms respond.  She may have a seronegative inflammatory arthropathy.  Less likely to be gout, psoriatic arthritis. If the labs are normal and the prednisone does not do anything, I will initiate treatment for fibromyalgia. Light exercise such as yoga and tai chi was recommended.  Try to keep an eye on triggers in the diet.  Heat, ice, and Tylenol were also recommended. Follow-up pending the above. The patient voiced understanding and agreement to the plan.  [place], DO 11/22/18  8:27 AM

## 2018-11-22 NOTE — Patient Instructions (Signed)
Give [place] 4-5 business days to get the results of your labs back.   Consider Youtube for yoga or other gentle exercises.  Keep an eye on triggers in your diet.  Ice/cold pack over area for 10-15 min twice daily.  Heat (pad or rice pillow in microwave) over affected area, 10-15 minutes twice daily.   OK to take Tylenol 1000 mg (2 extra strength tabs) or 975 mg (3 regular strength tabs) every 6 hours as needed.  Let us know if you need anything.

## 2018-11-23 LAB — ALDOLASE: Aldolase: 3.2 U/L (ref <=8.1)

## 2018-11-24 ENCOUNTER — Encounter: Payer: Self-pay | Admitting: Family Medicine

## 2018-11-24 ENCOUNTER — Other Ambulatory Visit: Payer: Self-pay | Admitting: Family Medicine

## 2018-11-24 DIAGNOSIS — E538 Deficiency of other specified B group vitamins: Secondary | ICD-10-CM

## 2018-11-24 LAB — ANTI-NUCLEAR AB-TITER (ANA TITER): ANA Titer 1: 1:40 {titer} — ABNORMAL HIGH

## 2018-11-24 LAB — RHEUMATOID FACTOR: Rheumatoid fact SerPl-aCnc: 17 IU/mL — ABNORMAL HIGH (ref <14)

## 2018-11-24 LAB — ANA: Anti Nuclear Antibody (ANA): POSITIVE — AB

## 2018-11-24 LAB — CYCLIC CITRUL PEPTIDE ANTIBODY, IGG: Cyclic Citrullin Peptide Ab: <16 UNITS

## 2018-11-24 LAB — ANTI-SMITH ANTIBODY: ENA SM Ab Ser-aCnc: <1 AI

## 2018-11-24 MED ORDER — CYANOCOBALAMIN 1000 MCG/ML IJ SOLN: INTRAMUSCULAR | 3 refills | Status: DC

## 2018-11-29 MED FILL — CYANOCOBALAMIN 1,000 MCG/ML: 1000 | 28 days supply | Qty: 2 | Fill #0

## 2018-12-09 ENCOUNTER — Encounter: Payer: Self-pay | Admitting: Family Medicine

## 2018-12-10 ENCOUNTER — Other Ambulatory Visit: Payer: Self-pay | Admitting: Family Medicine

## 2018-12-10 DIAGNOSIS — M2550 Pain in unspecified joint: Secondary | ICD-10-CM

## 2018-12-21 MED FILL — HYDROCHLOROTHIAZIDE 25 MG T: 25 | 30 days supply | Qty: 30 | Fill #3

## 2019-01-06 ENCOUNTER — Other Ambulatory Visit (INDEPENDENT_AMBULATORY_CARE_PROVIDER_SITE_OTHER): Payer: 59

## 2019-01-06 ENCOUNTER — Encounter: Payer: Self-pay | Admitting: Family Medicine

## 2019-01-06 ENCOUNTER — Other Ambulatory Visit: Payer: Self-pay

## 2019-01-06 DIAGNOSIS — E538 Deficiency of other specified B group vitamins: Secondary | ICD-10-CM

## 2019-01-06 DIAGNOSIS — E559 Vitamin D deficiency, unspecified: Secondary | ICD-10-CM | POA: Diagnosis not present

## 2019-01-06 LAB — VITAMIN D 25 HYDROXY (VIT D DEFICIENCY, FRACTURES): VITD: 26.61 ng/mL — ABNORMAL LOW (ref 30.00–100.00)

## 2019-01-06 LAB — VITAMIN B12: Vitamin B-12: 278 pg/mL (ref 211–911)

## 2019-01-10 ENCOUNTER — Other Ambulatory Visit: Payer: Self-pay | Admitting: Family Medicine

## 2019-01-10 DIAGNOSIS — E538 Deficiency of other specified B group vitamins: Secondary | ICD-10-CM

## 2019-01-10 DIAGNOSIS — E559 Vitamin D deficiency, unspecified: Secondary | ICD-10-CM

## 2019-01-18 MED FILL — HYDROCHLOROTHIAZIDE 25 MG T: 25 | 30 days supply | Qty: 30 | Fill #4

## 2019-01-31 ENCOUNTER — Encounter: Payer: Self-pay | Admitting: Family Medicine

## 2019-01-31 ENCOUNTER — Ambulatory Visit (INDEPENDENT_AMBULATORY_CARE_PROVIDER_SITE_OTHER): Payer: 59 | Admitting: Family Medicine

## 2019-01-31 ENCOUNTER — Other Ambulatory Visit: Payer: Self-pay

## 2019-01-31 DIAGNOSIS — R300 Dysuria: Secondary | ICD-10-CM | POA: Diagnosis not present

## 2019-01-31 MED ORDER — NITROFURANTOIN MONOHYD MACRO 100 MG PO CAPS: 100.0000 mg | ORAL_CAPSULE | Freq: Two times a day (BID) | ORAL | 0 refills | Status: AC

## 2019-01-31 MED FILL — NITROFURANTOIN MONO-MCR 100: 100 | 5 days supply | Qty: 10 | Fill #0

## 2019-01-31 MED FILL — CYANOCOBALAMIN 1,000 MCG/ML: 1000 | 28 days supply | Qty: 2 | Fill #1

## 2019-01-31 NOTE — Progress Notes (Signed)
Chief Complaint  Patient presents with  . Dysuria    pressure    Stephanie Castro is a 54 y.o. female here for possible UTI. Due to COVID-19 pandemic, we are interacting via web portal for an electronic face-to-face visit. I verified patient's ID using 2 identifiers. Patient agreed to proceed with visit via this method. Patient is at home, I am at office. Patient and I are present for visit.   Duration: 1 day. Symptoms: Dysuria, urinary frequency, urinary retention and lower abd pressure Denies: hematuria, fever, nausea and vomiting, vaginal discharge Hx of recurrent UTI? No; does come every so often though  ROS:  Constitutional: denies fever GU: As noted in HPI  Past Medical History:  Diagnosis Date  . Arthritis    both knees , back and elbows  . Dysplastic nevus    beneath r eye  . Exercise-induced asthma   . GERD (gastroesophageal reflux disease)   . Hypertension   . Primary localized osteoarthritis of right knee 10/13/2014  . Squamous cell carcinoma, face 02/24/2014  . Tear of medial meniscus of right knee 10/13/2014    Exam No conversational dyspnea Age appropriate judgment and insight Nml affect and mood   Dysuria - Plan: nitrofurantoin, macrocrystal-monohydrate, (MACROBID) 100 MG capsule  Orders as above. 5 d course.  Stay hydrated. Seek immediate care if pt starts to develop fevers, new/worsening symptoms, uncontrollable N/V. F/u prn. The patient voiced understanding and agreement to the plan.  [place], DO 01/31/19 2:21 PM

## 2019-02-01 NOTE — Progress Notes (Signed)
Virtual Visit via Telephone Note  I connected with Stephanie Castro on 02/03/19 at  8:15 AM EST by a telephone enabled telemedicine application and verified that I am speaking with the correct person using two identifiers.  Location: Patient: Home Provider: Clinic  This service was conducted via virtual visit.  The patient was located at home. I was located in my office.  Consent was obtained prior to the virtual visit and is aware of possible charges through their insurance for this visit.  The patient is an established patient.  Dr. Estanislado Pandy, MD conducted the virtual visit and Hazel Sams, PA-C acted as scribe during the service.  Office staff helped with scheduling follow up visits after the service was conducted.   I discussed the limitations of evaluation and management by telemedicine and the availability of in person appointments. The patient expressed understanding and agreed to proceed.  CC: History of Present Illness: Patient is a 54 year old female with a past medical history of osteoarthritis, seen in consultation per request of her PCP.  According to patient her symptoms started several years ago with lower back pain.  She states in 2015 she joined [employer] and used to do exercise on a regular basis.  She states at the time she started having some stiffness in her hips knee joints and ankle joints.  In 2016 she developed a right knee joint meniscal tear for which she had arthroscopic surgery.  She states over time the knee joint and the hip joint pain persist.  She also has been told that she has edema in her bilateral lower extremities due to poor circulation.  She states for the last few months she has been experiencing increased pain in her bilateral shoulders more so on the right than the left side.  She has nocturnal pain in her shoulders.  She is also had discomfort in her elbows for the last few years.  She has been diagnosed with bilateral lateral epicondylitis and had cortisone  injections.  She has had trochanteric bursa injection on her left side which helped and the pain has recurred now.  She gives history of a stiffness in her wrist joints.  She denies any swelling in her hands or feet.  She states she has difficulty walking due to pain in her hip joints and her knee joints.  She states she points feels quite frustrated due to ongoing joint pain and discomfort.  She was seen by her PCP recently and had labs which showed positive rheumatoid factor.She gives family history of rheumatoid arthritis in her maternal grandfather and her brother.  Review of Systems  Constitutional: Positive for malaise/fatigue. Negative for fever.  HENT: Negative for congestion.   Eyes: Positive for pain. Negative for photophobia, discharge and redness.  Respiratory: Negative for cough and wheezing.   Cardiovascular: Negative for chest pain and palpitations.  Gastrointestinal: Negative for blood in stool, constipation and diarrhea.  Genitourinary: Negative for dysuria.  Musculoskeletal: Positive for joint pain. Negative for back pain, myalgias and neck pain.  Skin: Positive for rash.  Neurological: Positive for weakness. Negative for dizziness and headaches.  Psychiatric/Behavioral: Negative for depression and memory loss. The patient is not nervous/anxious and does not have insomnia.       Observations/Objective: Physical Exam  Constitutional: She is oriented to person, place, and time and well-developed, well-nourished, and in no distress.  HENT:  Head: Normocephalic and atraumatic.  Eyes: Conjunctivae are normal.  Pulmonary/Chest: Effort normal.  Neurological: She is alert  and oriented to person, place, and time.  Psychiatric: Mood, memory, affect and judgment normal.   Patient reports morning stiffness for  45 minutes.   Patient reports nocturnal pain.  Difficulty dressing/grooming: Reports Difficulty climbing stairs: Reports Difficulty getting out of chair:  Reports Difficulty using hands for taps, buttons, cutlery, and/or writing: Denies    11/22/18: ANA 1:40 NS, CCP<16, RF 17, smith<1, vitamin B 12 152 (278 on 01/06/19), vitamin D 28.41 (26.6 on 01/06/19), aldolase 3.2   Assessment and Plan:  Diagnoses and all orders for this visit:  Polyarthralgia- Patient complains of pain in multiple joints for the last few years.  She denies any joint swelling.  Most recent labs showed positive rheumatoid factor at the low titer and ANA at low titer.  There is family history of rheumatoid arthritis.  I will obtain following labs today.  She will come in the office for evaluation after the labs. -     14-3-3 eta Protein -     Uric acid -     ESR  Positive ANA (antinuclear antibody)- Patient gives history of intermittent rash.  There is no history of oral ulcers, nasal ulcers, Raynaud's phenomenon, hair loss. Comments: 11/22/18: ANA 1:40 NS, CCP<16, RF 17, smith<1, vitamin B 12 152 (278 on 01/06/19), vitamin D 28.41 (26.6 on 01/06/19), aldolase 3.2  Orders: -     CK -     Scl-70 -     RNP Antibody -     Ro -     La -     Ds DNA -     C3 and C4  Chronic pain of both shoulders- She complains of pain in bilateral shoulders.  She states she has nocturnal pain.  I plan to obtain x-rays at the follow-up visit.  Lateral epicondylitis of both elbows- She gives history of recurrent lateral epicondylitis and bilateral elbows.  She has had cortisone injections in the past.  Chronic pain of both hips- She complains of pain in bilateral hip joints to the point that she has difficulty walking.  She states she has had trochanteric bursa injection recently in her left hip which helped but the pain has recurred now.  Primary localized osteoarthritis of right knee- Patient states that she has pain in her bilateral knee joints.  She is not sure if it is because of favoring her right knee due to arthroscopic surgery in the past.  I plan to obtain x-ray of her  knee joints at the follow-up visit.  S/P arthroscopic surgery of right knee Comments: Meniscal tear repair  Arthropathy of lumbar facet joint- I reviewed x-rays of her lumbar spine which showed some facet joint arthropathy.  Other fatigue -     CK  High risk medication use- I will obtain following labs in anticipation to start her on any treatment. -     G6PD -     CMP -     CBC  Vitamin D deficiency  Vitamin B12 deficiency without anemia  Family history of rheumatoid arthritis Comments: Maternal grandfather and brother  Squamous cell carcinoma, face  Exercise-induced asthma  Essential hypertension  History of gastroesophageal reflux (GERD)   Follow Up Instructions: She will follow up in    I discussed the assessment and treatment plan with the patient. The patient was provided an opportunity to ask questions and all were answered. The patient agreed with the plan and demonstrated an understanding of the instructions.   The patient was advised to call  back or seek an in-person evaluation if the symptoms worsen or if the condition fails to improve as anticipated.  I provided 45 minutes of non-face-to-face time during this encounter.   Bo Merino, MD

## 2019-02-03 ENCOUNTER — Other Ambulatory Visit: Payer: Self-pay

## 2019-02-03 ENCOUNTER — Encounter: Payer: Self-pay | Admitting: Rheumatology

## 2019-02-03 ENCOUNTER — Telehealth (INDEPENDENT_AMBULATORY_CARE_PROVIDER_SITE_OTHER): Payer: 59 | Admitting: Rheumatology

## 2019-02-03 DIAGNOSIS — M25511 Pain in right shoulder: Secondary | ICD-10-CM

## 2019-02-03 DIAGNOSIS — R768 Other specified abnormal immunological findings in serum: Secondary | ICD-10-CM | POA: Diagnosis not present

## 2019-02-03 DIAGNOSIS — R7689 Other specified abnormal immunological findings in serum: Secondary | ICD-10-CM

## 2019-02-03 DIAGNOSIS — R5383 Other fatigue: Secondary | ICD-10-CM

## 2019-02-03 DIAGNOSIS — M7711 Lateral epicondylitis, right elbow: Secondary | ICD-10-CM | POA: Diagnosis not present

## 2019-02-03 DIAGNOSIS — I10 Essential (primary) hypertension: Secondary | ICD-10-CM

## 2019-02-03 DIAGNOSIS — J45990 Exercise induced bronchospasm: Secondary | ICD-10-CM

## 2019-02-03 DIAGNOSIS — M47816 Spondylosis without myelopathy or radiculopathy, lumbar region: Secondary | ICD-10-CM

## 2019-02-03 DIAGNOSIS — M7712 Lateral epicondylitis, left elbow: Secondary | ICD-10-CM

## 2019-02-03 DIAGNOSIS — M2550 Pain in unspecified joint: Secondary | ICD-10-CM | POA: Diagnosis not present

## 2019-02-03 DIAGNOSIS — M25552 Pain in left hip: Secondary | ICD-10-CM

## 2019-02-03 DIAGNOSIS — E538 Deficiency of other specified B group vitamins: Secondary | ICD-10-CM

## 2019-02-03 DIAGNOSIS — C44320 Squamous cell carcinoma of skin of unspecified parts of face: Secondary | ICD-10-CM

## 2019-02-03 DIAGNOSIS — Z8261 Family history of arthritis: Secondary | ICD-10-CM

## 2019-02-03 DIAGNOSIS — M25512 Pain in left shoulder: Secondary | ICD-10-CM

## 2019-02-03 DIAGNOSIS — M25551 Pain in right hip: Secondary | ICD-10-CM

## 2019-02-03 DIAGNOSIS — Z8719 Personal history of other diseases of the digestive system: Secondary | ICD-10-CM

## 2019-02-03 DIAGNOSIS — Z79899 Other long term (current) drug therapy: Secondary | ICD-10-CM

## 2019-02-03 DIAGNOSIS — G8929 Other chronic pain: Secondary | ICD-10-CM

## 2019-02-03 DIAGNOSIS — E559 Vitamin D deficiency, unspecified: Secondary | ICD-10-CM

## 2019-02-03 DIAGNOSIS — M1711 Unilateral primary osteoarthritis, right knee: Secondary | ICD-10-CM

## 2019-02-03 DIAGNOSIS — Z9889 Other specified postprocedural states: Secondary | ICD-10-CM

## 2019-02-04 ENCOUNTER — Ambulatory Visit: Payer: 59 | Attending: Internal Medicine

## 2019-02-04 DIAGNOSIS — Z20822 Contact with and (suspected) exposure to covid-19: Secondary | ICD-10-CM

## 2019-02-04 NOTE — Progress Notes (Signed)
Potassium is low-3.1. Rest of CMP WNL. Please notify patient and forward results to PCP.

## 2019-02-06 LAB — NOVEL CORONAVIRUS, NAA: SARS-CoV-2, NAA: DETECTED — AB

## 2019-02-07 LAB — ANTI-SCLERODERMA ANTIBODY: Scleroderma (Scl-70) (ENA) Antibody, IgG: <1 AI

## 2019-02-07 LAB — CBC WITH DIFFERENTIAL/PLATELET
Absolute Monocytes: 498 cells/uL (ref 200–950)
Basophils Absolute: 61 cells/uL (ref 0–200)
Basophils Relative: 1.3 %
Eosinophils Absolute: 118 cells/uL (ref 15–500)
Eosinophils Relative: 2.5 %
HCT: 39.9 % (ref 35.0–45.0)
Hemoglobin: 13.4 g/dL (ref 11.7–15.5)
Lymphs Abs: 1227 cells/uL (ref 850–3900)
MCH: 30.7 pg (ref 27.0–33.0)
MCHC: 33.6 g/dL (ref 32.0–36.0)
MCV: 91.3 fL (ref 80.0–100.0)
MPV: 10.4 fL (ref 7.5–12.5)
Monocytes Relative: 10.6 %
Neutro Abs: 2797 cells/uL (ref 1500–7800)
Neutrophils Relative %: 59.5 %
Platelets: 207 10*3/uL (ref 140–400)
RBC: 4.37 10*6/uL (ref 3.80–5.10)
RDW: 12.3 % (ref 11.0–15.0)
Total Lymphocyte: 26.1 %
WBC: 4.7 10*3/uL (ref 3.8–10.8)

## 2019-02-07 LAB — COMPLETE METABOLIC PANEL WITH GFR
AG Ratio: 1.7 (calc) (ref 1.0–2.5)
ALT: 12 U/L (ref 6–29)
AST: 20 U/L (ref 10–35)
Albumin: 4.4 g/dL (ref 3.6–5.1)
Alkaline phosphatase (APISO): 55 U/L (ref 37–153)
BUN: 12 mg/dL (ref 7–25)
CO2: 32 mmol/L (ref 20–32)
Calcium: 9.8 mg/dL (ref 8.6–10.4)
Chloride: 101 mmol/L (ref 98–110)
Creat: 0.79 mg/dL (ref 0.50–1.05)
GFR, Est African American: 98 mL/min/{1.73_m2} (ref >=60)
GFR, Est Non African American: 85 mL/min/{1.73_m2} (ref >=60)
Globulin: 2.6 g/dL (calc) (ref 1.9–3.7)
Glucose, Bld: 80 mg/dL (ref 65–139)
Potassium: 3.1 mmol/L — ABNORMAL LOW (ref 3.5–5.3)
Sodium: 140 mmol/L (ref 135–146)
Total Bilirubin: 0.7 mg/dL (ref 0.2–1.2)
Total Protein: 7 g/dL (ref 6.1–8.1)

## 2019-02-07 LAB — URIC ACID: Uric Acid, Serum: 7.6 mg/dL — ABNORMAL HIGH (ref 2.5–7.0)

## 2019-02-07 LAB — GLUCOSE 6 PHOSPHATE DEHYDROGENASE: G-6PDH: 16.9 U/g Hgb (ref 7.0–20.5)

## 2019-02-07 LAB — SJOGRENS SYNDROME-B EXTRACTABLE NUCLEAR ANTIBODY: SSB (La) (ENA) Antibody, IgG: <1 AI

## 2019-02-07 LAB — SEDIMENTATION RATE: Sed Rate: 6 mm/h (ref 0–30)

## 2019-02-07 LAB — 14-3-3 ETA PROTEIN: 14-3-3 eta Protein: <0.2 ng/mL (ref <0.2)

## 2019-02-07 LAB — C3 AND C4
C3 Complement: 141 mg/dL (ref 83–193)
C4 Complement: 38 mg/dL (ref 15–57)

## 2019-02-07 LAB — CK: Total CK: 78 U/L (ref 29–143)

## 2019-02-07 LAB — SJOGRENS SYNDROME-A EXTRACTABLE NUCLEAR ANTIBODY: SSA (Ro) (ENA) Antibody, IgG: <1 AI

## 2019-02-07 LAB — RNP ANTIBODY: Ribonucleic Protein(ENA) Antibody, IgG: <1 AI

## 2019-02-07 NOTE — Progress Notes (Signed)
We will discuss lab work at new patient follow up visit.

## 2019-02-23 MED FILL — HYDROCHLOROTHIAZIDE 25 MG T: 25 | 30 days supply | Qty: 30 | Fill #5

## 2019-03-01 ENCOUNTER — Ambulatory Visit: Payer: 59 | Admitting: Rheumatology

## 2019-03-14 NOTE — Progress Notes (Deleted)
Office Visit Note  Patient: Stephanie Castro             Date of Birth: February 02, 1965           MRN: [id number]             PCP: [name], DO Referring: [name]* Visit Date: 03/17/2019 Occupation: @GUAROCC @  Subjective:  No chief complaint on file.   History of Present Illness: Stephanie Castro is a 54 y.o. female ***   Activities of Daily Living:  Patient reports morning stiffness for *** {minute/hour:19697}.   Patient {ACTIONS;DENIES/REPORTS:21021675::"Denies"} nocturnal pain.  Difficulty dressing/grooming: {ACTIONS;DENIES/REPORTS:21021675::"Denies"} Difficulty climbing stairs: {ACTIONS;DENIES/REPORTS:21021675::"Denies"} Difficulty getting out of chair: {ACTIONS;DENIES/REPORTS:21021675::"Denies"} Difficulty using hands for taps, buttons, cutlery, and/or writing: {ACTIONS;DENIES/REPORTS:21021675::"Denies"}  No Rheumatology ROS completed.   PMFS History:  Patient Active Problem List   Diagnosis Date Noted  . Lateral epicondylitis of both elbows 08/06/2018  . Concern about female breast disease without diagnosis 03/17/2018  . Essential hypertension 02/26/2017  . Exercise-induced asthma   . DOE (dyspnea on exertion) 08/08/2015  . Elevated BP without diagnosis of hypertension 06/15/2015  . Weight gain 04/24/2015  . Primary localized osteoarthritis of right knee 10/13/2014  . Squamous cell carcinoma, face 02/24/2014  . Vitamin D deficiency 02/24/2014  . Fatigue 07/06/2013  . GERD (gastroesophageal reflux disease) 07/06/2013  . Vitamin B12 deficiency without anemia 05/15/2013  . BMI 35.0-35.9,adult 05/15/2013  . Perimenopausal 05/15/2013  . Low TSH level 04/22/2013  . Breast cancer screening, high risk patient 04/04/2013  . Periodic edema 04/04/2013  . Other and unspecified hyperlipidemia 04/06/2011    Past Medical History:  Diagnosis Date  . Arthritis    both knees , back and elbows  . Dysplastic nevus    beneath r eye  . Exercise-induced  asthma   . GERD (gastroesophageal reflux disease)   . Hypertension   . Primary localized osteoarthritis of right knee 10/13/2014  . Squamous cell carcinoma, face 02/24/2014  . Tear of medial meniscus of right knee 10/13/2014    Family History  Problem Relation Age of Onset  . Hyperlipidemia Mother   . Breast cancer Mother 44       breast  . Heart attack Father   . COPD Father   . Heart attack Sister   . Hyperlipidemia Sister   . Hypertension Sister   . Hyperlipidemia Maternal Aunt   . Breast cancer Maternal Aunt        breast  . Hypertension Maternal Uncle   . Diabetes Maternal Uncle   . Colon cancer Maternal Uncle        colon  . Hyperlipidemia Maternal Grandmother   . Diabetes Maternal Grandmother   . Breast cancer Maternal Grandmother        breast  . Diabetes Maternal Grandfather   . Heart attack Sister   . Sudden death Neg Hx    Past Surgical History:  Procedure Laterality Date  . ABDOMINAL HYSTERECTOMY  2007  . KNEE ARTHROSCOPY WITH MEDIAL MENISECTOMY Right 10/13/2014   Procedure: RIGHT KNEE ARTHROSCOPY WITH PARTIAL MEDIAL MENISECTOMY AND CHONDROPLASTY ;  Surgeon: Marchia Bond, MD;  Location: [facility];  Service: Orthopedics;  Laterality: Right;   Social History   Social History Narrative   Married to [place].    Immunization History  Administered Date(s) Administered  . Tdap 02/15/2008     Objective: Vital Signs: There were no vitals taken for this visit.   Physical Exam   Musculoskeletal Exam: ***  CDAI Exam: CDAI Score: -- Patient Global: --; Provider Global: -- Swollen: --; Tender: -- Joint Exam 03/17/2019   No joint exam has been documented for this visit   There is currently no information documented on the homunculus. Go to the Rheumatology activity and complete the homunculus joint exam.  Investigation: No additional findings.  Imaging: No results found.  Recent Labs: Lab Results  Component Value Date   WBC 4.7  02/03/2019   HGB 13.4 02/03/2019   PLT 207 02/03/2019   NA 140 02/03/2019   K 3.1 (L) 02/03/2019   CL 101 02/03/2019   CO2 32 02/03/2019   GLUCOSE 80 02/03/2019   BUN 12 02/03/2019   CREATININE 0.79 02/03/2019   BILITOT 0.7 02/03/2019   ALKPHOS 68 02/26/2017   AST 20 02/03/2019   ALT 12 02/03/2019   PROT 7.0 02/03/2019   ALBUMIN 4.2 02/26/2017   CALCIUM 9.8 02/03/2019   GFRAA 98 02/03/2019  February 02, 2018 C3-C4 normal, Ro negative, La negative, RNP negative, SCL 70 -, G6PD normal, uric acid 7.6, 14 3 3  eta negative, CK 78  11/22/18: ANA 1:40 NS, CCP<16, RF 17, smith<1, vitamin B 12 152 (278 on 01/06/19), vitamin D 28.41 (26.6 on 01/06/19), aldolase 3.2   Speciality Comments: No specialty comments available.  Procedures:  No procedures performed Allergies: Penicillins   Assessment / Plan:     Visit Diagnoses: No diagnosis found.  Orders: No orders of the defined types were placed in this encounter.  No orders of the defined types were placed in this encounter.   Face-to-face time spent with patient was *** minutes. Greater than 50% of time was spent in counseling and coordination of care.  Follow-Up Instructions: No follow-ups on file.   Bo Merino, MD  Note - This record has been created using Editor, commissioning.  Chart creation errors have been sought, but may not always  have been located. Such creation errors do not reflect on  the standard of medical care.

## 2019-03-17 ENCOUNTER — Ambulatory Visit: Payer: 59 | Admitting: Rheumatology

## 2019-03-19 NOTE — Progress Notes (Signed)
Office Visit Note  Patient: Stephanie Castro             Date of Birth: April 05, 1965           MRN: [id number]             PCP: [name], DO Referring: [name]* Visit Date: 03/22/2019 Occupation: _0 @  Subjective:  Pain in multiple joints   History of Present Illness: Stephanie Castro is a 54 y.o. female with history of osteoarthritis.  She states she continues to have pain and discomfort in her both shoulders, both elbows, bilateral trochanteric bursa, both knees and her right foot.  She denies any joint swelling.  She states her right shoulder joint is frozen.  Her right knee joint had arthroscopic surgery and it still painful.  She continues to have lower back pain.  Activities of Daily Living:  Patient reports morning stiffness for 1 hour.   Patient Reports nocturnal pain.  Difficulty dressing/grooming: Denies Difficulty climbing stairs: Reports Difficulty getting out of chair: Reports Difficulty using hands for taps, buttons, cutlery, and/or writing: Denies  Review of Systems  Constitutional: Positive for fatigue. Negative for night sweats, weight gain and weight loss.  HENT: Negative for mouth sores, trouble swallowing, trouble swallowing, mouth dryness and nose dryness.   Eyes: Positive for dryness. Negative for pain, redness and visual disturbance.  Respiratory: Negative for cough, shortness of breath and difficulty breathing.   Cardiovascular: Negative for chest pain, palpitations, hypertension, irregular heartbeat and swelling in legs/feet.  Gastrointestinal: Positive for constipation and diarrhea. Negative for blood in stool.  Endocrine: Negative for excessive thirst and increased urination.  Genitourinary: Negative for difficulty urinating and vaginal dryness.  Musculoskeletal: Positive for arthralgias, gait problem, joint pain, muscle weakness and morning stiffness. Negative for joint swelling, myalgias, muscle tenderness and myalgias.    Skin: Negative for color change, rash, hair loss, skin tightness, ulcers and sensitivity to sunlight.  Allergic/Immunologic: Negative for susceptible to infections.  Neurological: Negative for dizziness, numbness, memory loss, night sweats and weakness.  Hematological: Negative for bruising/bleeding tendency and swollen glands.  Psychiatric/Behavioral: Positive for sleep disturbance. Negative for depressed mood. The patient is not nervous/anxious.     PMFS History:  Patient Active Problem List   Diagnosis Date Noted  . Lateral epicondylitis of both elbows 08/06/2018  . Concern about female breast disease without diagnosis 03/17/2018  . Essential hypertension 02/26/2017  . Exercise-induced asthma   . DOE (dyspnea on exertion) 08/08/2015  . Elevated BP without diagnosis of hypertension 06/15/2015  . Weight gain 04/24/2015  . Primary localized osteoarthritis of right knee 10/13/2014  . Squamous cell carcinoma, face 02/24/2014  . Vitamin D deficiency 02/24/2014  . Fatigue 07/06/2013  . GERD (gastroesophageal reflux disease) 07/06/2013  . Vitamin B12 deficiency without anemia 05/15/2013  . BMI 35.0-35.9,adult 05/15/2013  . Perimenopausal 05/15/2013  . Low TSH level 04/22/2013  . Breast cancer screening, high risk patient 04/04/2013  . Periodic edema 04/04/2013  . Other and unspecified hyperlipidemia 04/06/2011    Past Medical History:  Diagnosis Date  . Arthritis    both knees , back and elbows  . Dysplastic nevus    beneath r eye  . Exercise-induced asthma   . GERD (gastroesophageal reflux disease)   . Hypertension   . Primary localized osteoarthritis of right knee 10/13/2014  . Squamous cell carcinoma, face 02/24/2014  . Tear of medial meniscus of right knee 10/13/2014    Family History  Problem Relation  Age of Onset  . Hyperlipidemia Mother   . Breast cancer Mother 20       breast  . Heart attack Father   . COPD Father   . Heart attack Sister   . Hyperlipidemia Sister    . Hypertension Sister   . Hyperlipidemia Maternal Aunt   . Breast cancer Maternal Aunt        breast  . Hypertension Maternal Uncle   . Diabetes Maternal Uncle   . Colon cancer Maternal Uncle        colon  . Hyperlipidemia Maternal Grandmother   . Diabetes Maternal Grandmother   . Breast cancer Maternal Grandmother        breast  . Diabetes Maternal Grandfather   . Heart attack Sister   . Sudden death Neg Hx    Past Surgical History:  Procedure Laterality Date  . ABDOMINAL HYSTERECTOMY  2007  . KNEE ARTHROSCOPY WITH MEDIAL MENISECTOMY Right 10/13/2014   Procedure: RIGHT KNEE ARTHROSCOPY WITH PARTIAL MEDIAL MENISECTOMY AND CHONDROPLASTY ;  Surgeon: Marchia Bond, MD;  Location: [place];  Service: Orthopedics;  Laterality: Right;   Social History   Social History Narrative   Married to [place].    Immunization History  Administered Date(s) Administered  . Tdap 02/15/2008     Objective: Vital Signs: BP 127/74 (BP Location: Right Arm, Patient Position: Sitting, Cuff Size: Normal)   Pulse 63   Resp 16   Ht _0  (1.575 m)   Wt 205 lb (93 kg)   BMI 37.49 kg/m    Physical Exam Vitals and nursing note reviewed.  Constitutional:      Appearance: She is well-developed.  HENT:     Head: Normocephalic and atraumatic.  Eyes:     Conjunctiva/sclera: Conjunctivae normal.  Cardiovascular:     Rate and Rhythm: Normal rate and regular rhythm.     Heart sounds: Normal heart sounds.  Pulmonary:     Effort: Pulmonary effort is normal.     Breath sounds: Normal breath sounds.  Abdominal:     General: Bowel sounds are normal.     Palpations: Abdomen is soft.  Musculoskeletal:     Cervical back: Normal range of motion.  Lymphadenopathy:     Cervical: No cervical adenopathy.  Skin:    General: Skin is warm and dry.     Capillary Refill: Capillary refill takes less than 2 seconds.  Neurological:     Mental Status: She is alert and oriented to person, place,  and time.  Psychiatric:        Behavior: Behavior normal.      Musculoskeletal Exam: C-spine thoracic and lumbar spine were in good range of motion.  She has some tenderness over the lower lumbar region.  No SI joint tenderness was noted.  She had discomfort range of motion of her right shoulder joint.  Elbow joints, wrist joints in good range of motion.  She has DIP thickening bilaterally consistent with osteoarthritis.  Hip joints are in good range of motion.  She has good range of motion of bilateral knee joints with discomfort.  Right knee joint was warm to touch.  Ankle joints with good range of motion.  She has some tenderness over the right second and third MTPs.  No synovitis was noted.  CDAI Exam: CDAI Score: -- Patient Global: --; Provider Global: -- Swollen: --; Tender: -- Joint Exam 03/22/2019   No joint exam has been documented for this visit   There  is currently no information documented on the homunculus. Go to the Rheumatology activity and complete the homunculus joint exam.  Investigation: No additional findings.  Imaging: No results found.  Recent Labs: Lab Results  Component Value Date   WBC 4.7 02/03/2019   HGB 13.4 02/03/2019   PLT 207 02/03/2019   NA 140 02/03/2019   K 3.1 (L) 02/03/2019   CL 101 02/03/2019   CO2 32 02/03/2019   GLUCOSE 80 02/03/2019   BUN 12 02/03/2019   CREATININE 0.79 02/03/2019   BILITOT 0.7 02/03/2019   ALKPHOS 68 02/26/2017   AST 20 02/03/2019   ALT 12 02/03/2019   PROT 7.0 02/03/2019   ALBUMIN 4.2 02/26/2017   CALCIUM 9.8 02/03/2019   GFRAA 98 02/03/2019  February 03, 2019 ESR 6, CK 78, uric acid 7.6, _0 eta negative, ENA (SCL 70, RNP, SSA, SSB antibodies negative), C3-C4 normal, G6PD normal  11/22/18: ANA 1:40 NS, CCP<16, RF 17, smith<1, vitamin B 12 152 (278 on 01/06/19), vitamin D 28.41 (26.6 on 01/06/19), aldolase 3.2   February 04, 2019 SARS Covid 2+  Speciality Comments: No specialty comments  available.  Procedures:  No procedures performed Allergies: Penicillins   Assessment / Plan:     Visit Diagnoses: Polyarthralgia - History of joint pain for many years.  There is no history of joint swelling.  Rheumatoid factor 17.  Uric acid 7.6.  Pain in both hands -patient complains of pain and discomfort in the bilateral hands.  No synovitis was noted.  She has DIP thickening.  Plan: XR Hand 2 View Right, XR Hand 2 View Left.  X-ray of bilateral hands were consistent with osteoarthritis.  Chronic pain of both shoulders -she has discomfort range of motion of her right shoulder joint.  She states she has history of discomfort in her bilateral shoulders for a while.  I will obtain x-rays of bilateral shoulders.  X-ray of bilateral shoulders were unremarkable.  She would benefit from physical therapy or exercises.  Have given her a handout on the shoulder joint exercises.  Trochanteric bursitis, left hip-she had tenderness on palpation over left trochanteric bursa consistent with trochanteric bursitis.  I offered physical therapy which she declined.  I also discussed the option of cortisone injection in the future.  Have given her a handout on IT band exercises.  Chronic pain of both knees -she has discomfort in her bilateral knee joints.  Right knee joint is warm to touch.  She had arthroscopic surgery on her right knee joint..  Plan: XR KNEE 3 VIEW RIGHT, XR KNEE 3 VIEW LEFT.  Right knee joint showed severe osteoarthritis and left knee joint showed moderate osteoarthritis.  Bilateral knee joint showed severe chondromalacia patella.  I offered cortisone injection to the right knee joint but she declined.  Weight loss diet and exercise was discussed.  She will eventually require right total knee replacement.  A handout on natural anti-inflammatories was given.  Pain in right foot -she complains of discomfort in her right foot.  She has difficulty walking.  Plan: XR Foot 2 Views Right.  X-ray of the  foot was unremarkable except for mild osteoarthritic changes.  She may benefit from better fitting shoes with arch support.  Arthropathy of lumbar facet joint-I reviewed her x-rays on epic.  The findings are consistent with arthropathy of the facet joint.  No significant disc space narrowing was noted.  Lateral epicondylitis of both elbows - History of cortisone injections in the past.  Positive  ANA (antinuclear antibody) - ANA 1: 40NS not a significant titer.  ENA completely negative, complements normal  Other medical problems are listed as follows:  Vitamin B12 deficiency without anemia  Vitamin D deficiency  Family history of rheumatoid arthritis  Essential hypertension  Exercise-induced asthma  History of gastroesophageal reflux (GERD)  Squamous cell carcinoma, face  Orders: Orders Placed This Encounter  Procedures  . XR Hand 2 View Right  . XR Hand 2 View Left  . XR KNEE 3 VIEW RIGHT  . XR KNEE 3 VIEW LEFT  . XR Foot 2 Views Right  . XR Shoulder Left  . XR Shoulder Right   No orders of the defined types were placed in this encounter.    Follow-Up Instructions: Return in about 6 months (around 09/19/2019) for Osteoarthritis.   Bo Merino, MD  Note - This record has been created using Editor, commissioning.  Chart creation errors have been sought, but may not always  have been located. Such creation errors do not reflect on  the standard of medical care.

## 2019-03-22 ENCOUNTER — Ambulatory Visit: Payer: Self-pay

## 2019-03-22 ENCOUNTER — Other Ambulatory Visit: Payer: Self-pay

## 2019-03-22 ENCOUNTER — Ambulatory Visit: Payer: 59 | Admitting: Rheumatology

## 2019-03-22 ENCOUNTER — Encounter: Payer: Self-pay | Admitting: Rheumatology

## 2019-03-22 VITALS — BP 127/74 | HR 63 | Resp 16 | Ht 62.0 in | Wt 205.0 lb

## 2019-03-22 DIAGNOSIS — M7712 Lateral epicondylitis, left elbow: Secondary | ICD-10-CM

## 2019-03-22 DIAGNOSIS — M25511 Pain in right shoulder: Secondary | ICD-10-CM

## 2019-03-22 DIAGNOSIS — E538 Deficiency of other specified B group vitamins: Secondary | ICD-10-CM

## 2019-03-22 DIAGNOSIS — M79641 Pain in right hand: Secondary | ICD-10-CM

## 2019-03-22 DIAGNOSIS — M2550 Pain in unspecified joint: Secondary | ICD-10-CM | POA: Diagnosis not present

## 2019-03-22 DIAGNOSIS — J45990 Exercise induced bronchospasm: Secondary | ICD-10-CM

## 2019-03-22 DIAGNOSIS — M47816 Spondylosis without myelopathy or radiculopathy, lumbar region: Secondary | ICD-10-CM

## 2019-03-22 DIAGNOSIS — M7711 Lateral epicondylitis, right elbow: Secondary | ICD-10-CM

## 2019-03-22 DIAGNOSIS — M25562 Pain in left knee: Secondary | ICD-10-CM | POA: Diagnosis not present

## 2019-03-22 DIAGNOSIS — M7062 Trochanteric bursitis, left hip: Secondary | ICD-10-CM

## 2019-03-22 DIAGNOSIS — R768 Other specified abnormal immunological findings in serum: Secondary | ICD-10-CM

## 2019-03-22 DIAGNOSIS — M25512 Pain in left shoulder: Secondary | ICD-10-CM

## 2019-03-22 DIAGNOSIS — M25561 Pain in right knee: Secondary | ICD-10-CM | POA: Diagnosis not present

## 2019-03-22 DIAGNOSIS — Z8261 Family history of arthritis: Secondary | ICD-10-CM

## 2019-03-22 DIAGNOSIS — M79642 Pain in left hand: Secondary | ICD-10-CM

## 2019-03-22 DIAGNOSIS — E559 Vitamin D deficiency, unspecified: Secondary | ICD-10-CM

## 2019-03-22 DIAGNOSIS — M79671 Pain in right foot: Secondary | ICD-10-CM

## 2019-03-22 DIAGNOSIS — C44320 Squamous cell carcinoma of skin of unspecified parts of face: Secondary | ICD-10-CM

## 2019-03-22 DIAGNOSIS — G8929 Other chronic pain: Secondary | ICD-10-CM

## 2019-03-22 DIAGNOSIS — Z8719 Personal history of other diseases of the digestive system: Secondary | ICD-10-CM

## 2019-03-22 DIAGNOSIS — I10 Essential (primary) hypertension: Secondary | ICD-10-CM

## 2019-03-22 NOTE — Patient Instructions (Addendum)
Journal for Nurse Practitioners, 15(4), 263-267. Retrieved November 02, 2017 from [email or web address]">  Knee Exercises Ask your health care provider which exercises are safe for you. Do exercises exactly as told by your health care provider and adjust them as directed. It is normal to feel mild stretching, pulling, tightness, or discomfort as you do these exercises. Stop right away if you feel sudden pain or your pain gets worse. Do not begin these exercises until told by your health care provider. Stretching and range-of-motion exercises These exercises warm up your muscles and joints and improve the movement and flexibility of your knee. These exercises also help to relieve pain and swelling. Knee extension, prone 1. Lie on your abdomen (prone position) on a bed. 2. Place your left / right knee just beyond the edge of the surface so your knee is not on the bed. You can put a towel under your left / right thigh just above your kneecap for comfort. 3. Relax your leg muscles and allow gravity to straighten your knee (extension). You should feel a stretch behind your left / right knee. 4. Hold this position for __________ seconds. 5. Scoot up so your knee is supported between repetitions. Repeat __________ times. Complete this exercise __________ times a day. Knee flexion, active  1. Lie on your back with both legs straight. If this causes back discomfort, bend your left / right knee so your foot is flat on the floor. 2. Slowly slide your left / right heel back toward your buttocks. Stop when you feel a gentle stretch in the front of your knee or thigh (flexion). 3. Hold this position for __________ seconds. 4. Slowly slide your left / right heel back to the starting position. Repeat __________ times. Complete this exercise __________ times a day. Quadriceps stretch, prone  1. Lie on your abdomen on a firm surface, such as a bed or padded floor. 2. Bend your left / right knee and hold  your ankle. If you cannot reach your ankle or pant leg, loop a belt around your foot and grab the belt instead. 3. Gently pull your heel toward your buttocks. Your knee should not slide out to the side. You should feel a stretch in the front of your thigh and knee (quadriceps). 4. Hold this position for __________ seconds. Repeat __________ times. Complete this exercise __________ times a day. Hamstring, supine 1. Lie on your back (supine position). 2. Loop a belt or towel over the ball of your left / right foot. The ball of your foot is on the walking surface, right under your toes. 3. Straighten your left / right knee and slowly pull on the belt to raise your leg until you feel a gentle stretch behind your knee (hamstring). ? Do not let your knee bend while you do this. ? Keep your other leg flat on the floor. 4. Hold this position for __________ seconds. Repeat __________ times. Complete this exercise __________ times a day. Strengthening exercises These exercises build strength and endurance in your knee. Endurance is the ability to use your muscles for a long time, even after they get tired. Quadriceps, isometric This exercise stretches the muscles in front of your thigh (quadriceps) without moving your knee joint (isometric). 1. Lie on your back with your left / right leg extended and your other knee bent. Put a rolled towel or small pillow under your knee if told by your health care provider. 2. Slowly tense the muscles in the front of your left /   right thigh. You should see your kneecap slide up toward your hip or see increased dimpling just above the knee. This motion will push the back of the knee toward the floor. 3. For __________ seconds, hold the muscle as tight as you can without increasing your pain. 4. Relax the muscles slowly and completely. Repeat __________ times. Complete this exercise __________ times a day. Straight leg raises This exercise stretches the muscles in front  of your thigh (quadriceps) and the muscles that move your hips (hip flexors). 1. Lie on your back with your left / right leg extended and your other knee bent. 2. Tense the muscles in the front of your left / right thigh. You should see your kneecap slide up or see increased dimpling just above the knee. Your thigh may even shake a bit. 3. Keep these muscles tight as you raise your leg 4-6 inches (10-15 cm) off the floor. Do not let your knee bend. 4. Hold this position for __________ seconds. 5. Keep these muscles tense as you lower your leg. 6. Relax your muscles slowly and completely after each repetition. Repeat __________ times. Complete this exercise __________ times a day. Hamstring, isometric 1. Lie on your back on a firm surface. 2. Bend your left / right knee about __________ degrees. 3. Dig your left / right heel into the surface as if you are trying to pull it toward your buttocks. Tighten the muscles in the back of your thighs (hamstring) to "dig" as hard as you can without increasing any pain. 4. Hold this position for __________ seconds. 5. Release the tension gradually and allow your muscles to relax completely for __________ seconds after each repetition. Repeat __________ times. Complete this exercise __________ times a day. Hamstring curls If told by your health care provider, do this exercise while wearing ankle weights. Begin with __________ lb weights. Then increase the weight by 1 lb (0.5 kg) increments. Do not wear ankle weights that are more than __________ lb. 1. Lie on your abdomen with your legs straight. 2. Bend your left / right knee as far as you can without feeling pain. Keep your hips flat against the floor. 3. Hold this position for __________ seconds. 4. Slowly lower your leg to the starting position. Repeat __________ times. Complete this exercise __________ times a day. Squats This exercise strengthens the muscles in front of your thigh and knee  (quadriceps). 1. Stand in front of a table, with your feet and knees pointing straight ahead. You may rest your hands on the table for balance but not for support. 2. Slowly bend your knees and lower your hips like you are going to sit in a chair. ? Keep your weight over your heels, not over your toes. ? Keep your lower legs upright so they are parallel with the table legs. ? Do not let your hips go lower than your knees. ? Do not bend lower than told by your health care provider. ? If your knee pain increases, do not bend as low. 3. Hold the squat position for __________ seconds. 4. Slowly push with your legs to return to standing. Do not use your hands to pull yourself to standing. Repeat __________ times. Complete this exercise __________ times a day. Wall slides This exercise strengthens the muscles in front of your thigh and knee (quadriceps). 1. Lean your back against a smooth wall or door, and walk your feet out 18-24 inches (46-61 cm) from it. 2. Place your feet hip-width apart. 3.   Slowly slide down the wall or door until your knees bend __________ degrees. Keep your knees over your heels, not over your toes. Keep your knees in line with your hips. 4. Hold this position for __________ seconds. Repeat __________ times. Complete this exercise __________ times a day. Straight leg raises This exercise strengthens the muscles that rotate the leg at the hip and move it away from your body (hip abductors). 1. Lie on your side with your left / right leg in the top position. Lie so your head, shoulder, knee, and hip line up. You may bend your bottom knee to help you keep your balance. 2. Roll your hips slightly forward so your hips are stacked directly over each other and your left / right knee is facing forward. 3. Leading with your heel, lift your top leg 4-6 inches (10-15 cm). You should feel the muscles in your outer hip lifting. ? Do not let your foot drift forward. ? Do not let your knee  roll toward the ceiling. 4. Hold this position for __________ seconds. 5. Slowly return your leg to the starting position. 6. Let your muscles relax completely after each repetition. Repeat __________ times. Complete this exercise __________ times a day. Straight leg raises This exercise stretches the muscles that move your hips away from the front of the pelvis (hip extensors). 1. Lie on your abdomen on a firm surface. You can put a pillow under your hips if that is more comfortable. 2. Tense the muscles in your buttocks and lift your left / right leg about 4-6 inches (10-15 cm). Keep your knee straight as you lift your leg. 3. Hold this position for __________ seconds. 4. Slowly lower your leg to the starting position. 5. Let your leg relax completely after each repetition. Repeat __________ times. Complete this exercise __________ times a day. This information is not intended to replace advice given to you by your health care provider. Make sure you discuss any questions you have with your health care provider. Document Revised: 11/03/2017 Document Reviewed: 11/03/2017 Else[employer] Patient Education  [place] [facility] Ask your health care provider which exercises are safe for you. Do exercises exactly as told by your health care provider and adjust them as directed. It is normal to feel mild stretching, pulling, tightness, or discomfort as you do these exercises. Stop right away if you feel sudden pain or your pain gets significantly worse. Do not begin these exercises until told by your health care provider. Stretching and range-of-motion exercises These exercises warm up your muscles and joints and improve the movement and flexibility of your hip and pelvis. Quadriceps stretch, prone  6. Lie on your abdomen on a firm surface, such as a bed or padded floor (prone position). 7. Bend your left / right knee and reach back to hold your ankle or pant leg. If you  cannot reach your ankle or pant leg, loop a belt around your foot and grab the belt instead. 8. Gently pull your heel toward your buttocks. Your knee should not slide out to the side. You should feel a stretch in the front of your thigh and knee (quadriceps). 9. Hold this position for __________ seconds. Repeat __________ times. Complete this exercise __________ times a day. Iliotibial band stretch An iliotibial band is a strong band of muscle tissue that runs from the outer side of your hip to the outer side of your thigh and knee. 5. Lie on your side with your left /  right leg in the top position. 6. Bend both of your knees and grab your left / right ankle. Stretch out your bottom arm to help you balance. 7. Slowly bring your top knee back so your thigh goes behind your trunk. 8. Slowly lower your top leg toward the floor until you feel a gentle stretch on the outside of your left / right hip and thigh. If you do not feel a stretch and your knee will not fall farther, place the heel of your other foot on top of your knee and pull your knee down toward the floor with your foot. 9. Hold this position for __________ seconds. Repeat __________ times. Complete this exercise __________ times a day. Strengthening exercises These exercises build strength and endurance in your hip and pelvis. Endurance is the ability to use your muscles for a long time, even after they get tired. Straight leg raises, side-lying This exercise strengthens the muscles that rotate the leg at the hip and move it away from your body (hip abductors). 5. Lie on your side with your left / right leg in the top position. Lie so your head, shoulder, hip, and knee line up. You may bend your bottom knee to help you balance. 6. Roll your hips slightly forward so your hips are stacked directly over each other and your left / right knee is facing forward. 7. Tense the muscles in your outer thigh and lift your top leg 4-6 inches (10-15  cm). 8. Hold this position for __________ seconds. 9. Slowly return to the starting position. Let your muscles relax completely before doing another repetition. Repeat __________ times. Complete this exercise __________ times a day. Leg raises, prone This exercise strengthens the muscles that move the hips (hip extensors). 5. Lie on your abdomen on your bed or a firm surface. You can put a pillow under your hips if that is more comfortable for your lower back. 6. Bend your left / right knee so your foot is straight up in the air. 7. Squeeze your buttocks muscles and lift your left / right thigh off the bed. Do not let your back arch. 8. Tense your thigh muscle as hard as you can without increasing any knee pain. 9. Hold this position for __________ seconds. 10. Slowly lower your leg to the starting position and allow it to relax completely. Repeat __________ times. Complete this exercise __________ times a day. Hip hike 5. Stand sideways on a bottom step. Stand on your left / right leg with your other foot unsupported next to the step. You can hold on to the railing or wall for balance if needed. 6. Keep your knees straight and your torso square. Then lift your left / right hip up toward the ceiling. 7. Slowly let your left / right hip lower toward the floor, past the starting position. Your foot should get closer to the floor. Do not lean or bend your knees. Repeat __________ times. Complete this exercise __________ times a day. This information is not intended to replace advice given to you by your health care provider. Make sure you discuss any questions you have with your health care provider. Document Revised: 05/06/2018 Document Reviewed: 11/04/2017 Elsevier Patient Education  2020 [place]. Back Exercises The following exercises strengthen the muscles that help to support the trunk and back. They also help to keep the lower back flexible. Doing these exercises can help to prevent  back pain or lessen existing pain.  If you have back pain  or discomfort, try doing these exercises 2-3 times each day or as told by your health care provider.  As your pain improves, do them once each day, but increase the number of times that you repeat the steps for each exercise (do more repetitions).  To prevent the recurrence of back pain, continue to do these exercises once each day or as told by your health care provider. Do exercises exactly as told by your health care provider and adjust them as directed. It is normal to feel mild stretching, pulling, tightness, or discomfort as you do these exercises, but you should stop right away if you feel sudden pain or your pain gets worse. Exercises Single knee to chest Repeat these steps 3-5 times for each leg: 1. Lie on your back on a firm bed or the floor with your legs extended. 2. Bring one knee to your chest. Your other leg should stay extended and in contact with the floor. 3. Hold your knee in place by grabbing your knee or thigh with both hands and hold. 4. Pull on your knee until you feel a gentle stretch in your lower back or buttocks. 5. Hold the stretch for 10-30 seconds. 6. Slowly release and straighten your leg. Pelvic tilt Repeat these steps 5-10 times: 1. Lie on your back on a firm bed or the floor with your legs extended. 2. Bend your knees so they are pointing toward the ceiling and your feet are flat on the floor. 3. Tighten your lower abdominal muscles to press your lower back against the floor. This motion will tilt your pelvis so your tailbone points up toward the ceiling instead of pointing to your feet or the floor. 4. With gentle tension and even breathing, hold this position for 5-10 seconds. Cat-cow Repeat these steps until your lower back becomes more flexible: 1. Get into a hands-and-knees position on a firm surface. Keep your hands under your shoulders, and keep your knees under your hips. You may place padding  under your knees for comfort. 2. Let your head hang down toward your chest. Contract your abdominal muscles and point your tailbone toward the floor so your lower back becomes rounded like the back of a cat. 3. Hold this position for 5 seconds. 4. Slowly lift your head, let your abdominal muscles relax and point your tailbone up toward the ceiling so your back forms a sagging arch like the back of a cow. 5. Hold this position for 5 seconds.  Press-ups Repeat these steps 5-10 times: 1. Lie on your abdomen (face-down) on the floor. 2. Place your palms near your head, about shoulder-width apart. 3. Keeping your back as relaxed as possible and keeping your hips on the floor, slowly straighten your arms to raise the top half of your body and lift your shoulders. Do not use your back muscles to raise your upper torso. You may adjust the placement of your hands to make yourself more comfortable. 4. Hold this position for 5 seconds while you keep your back relaxed. 5. Slowly return to lying flat on the floor.  Bridges Repeat these steps 10 times: 1. Lie on your back on a firm surface. 2. Bend your knees so they are pointing toward the ceiling and your feet are flat on the floor. Your arms should be flat at your sides, next to your body. 3. Tighten your buttocks muscles and lift your buttocks off the floor until your waist is at almost the same height as your knees. You  should feel the muscles working in your buttocks and the back of your thighs. If you do not feel these muscles, slide your feet 1-2 inches farther away from your buttocks. 4. Hold this position for 3-5 seconds. 5. Slowly lower your hips to the starting position, and allow your buttocks muscles to relax completely. If this exercise is too easy, try doing it with your arms crossed over your chest. Abdominal crunches Repeat these steps 5-10 times: 1. Lie on your back on a firm bed or the floor with your legs extended. 2. Bend your knees  so they are pointing toward the ceiling and your feet are flat on the floor. 3. Cross your arms over your chest. 4. Tip your chin slightly toward your chest without bending your neck. 5. Tighten your abdominal muscles and slowly raise your trunk (torso) high enough to lift your shoulder blades a tiny bit off the floor. Avoid raising your torso higher than that because it can put too much stress on your low back and does not help to strengthen your abdominal muscles. 6. Slowly return to your starting position. Back lifts Repeat these steps 5-10 times: 1. Lie on your abdomen (face-down) with your arms at your sides, and rest your forehead on the floor. 2. Tighten the muscles in your legs and your buttocks. 3. Slowly lift your chest off the floor while you keep your hips pressed to the floor. Keep the back of your head in line with the curve in your back. Your eyes should be looking at the floor. 4. Hold this position for 3-5 seconds. 5. Slowly return to your starting position. Contact a health care provider if:  Your back pain or discomfort gets much worse when you do an exercise.  Your worsening back pain or discomfort does not lessen within 2 hours after you exercise. If you have any of these problems, stop doing these exercises right away. Do not do them again unless your health care provider says that you can. Get help right away if:  You develop sudden, severe back pain. If this happens, stop doing the exercises right away. Do not do them again unless your health care provider says that you can. This information is not intended to replace advice given to you by your health care provider. Make sure you discuss any questions you have with your health care provider. Document Revised: 05/20/2018 Document Reviewed: 10/15/2017 Elsevier Patient Education  [place].  Shoulder Exercises Ask your health care provider which exercises are safe for you. Do exercises exactly as told by your  health care provider and adjust them as directed. It is normal to feel mild stretching, pulling, tightness, or discomfort as you do these exercises. Stop right away if you feel sudden pain or your pain gets worse. Do not begin these exercises until told by your health care provider. Stretching exercises External rotation and abduction This exercise is sometimes called corner stretch. This exercise rotates your arm outward (external rotation) and moves your arm out from your body (abduction). 10. Stand in a doorway with one of your feet slightly in front of the other. This is called a staggered stance. If you cannot reach your forearms to the door frame, stand facing a corner of a room. 11. Choose one of the following positions as told by your health care provider: ? Place your hands and forearms on the door frame above your head. ? Place your hands and forearms on the door frame at the height  of your head. ? Place your hands on the door frame at the height of your elbows. 12. Slowly move your weight onto your front foot until you feel a stretch across your chest and in the front of your shoulders. Keep your head and chest upright and keep your abdominal muscles tight. 13. Hold for __________ seconds. 14. To release the stretch, shift your weight to your back foot. Repeat __________ times. Complete this exercise __________ times a day. Extension, standing 10. Stand and hold a broomstick, a cane, or a similar object behind your back. ? Your hands should be a little wider than shoulder width apart. ? Your palms should face away from your back. 11. Keeping your elbows straight and your shoulder muscles relaxed, move the stick away from your body until you feel a stretch in your shoulders (extension). ? Avoid shrugging your shoulders while you move the stick. Keep your shoulder blades tucked down toward the middle of your back. 12. Hold for __________ seconds. 13. Slowly return to the starting  position. Repeat __________ times. Complete this exercise __________ times a day. Range-of-motion exercises Pendulum  10. Stand near a wall or a surface that you can hold onto for balance. 11. Bend at the waist and let your left / right arm hang straight down. Use your other arm to support you. Keep your back straight and do not lock your knees. 12. Relax your left / right arm and shoulder muscles, and move your hips and your trunk so your left / right arm swings freely. Your arm should swing because of the motion of your body, not because you are using your arm or shoulder muscles. 13. Keep moving your hips and trunk so your arm swings in the following directions, as told by your health care provider: ? Side to side. ? Forward and backward. ? In clockwise and counterclockwise circles. 14. Continue each motion for __________ seconds, or for as long as told by your health care provider. 15. Slowly return to the starting position. Repeat __________ times. Complete this exercise __________ times a day. Shoulder flexion, standing  11. Stand and hold a broomstick, a cane, or a similar object. Place your hands a little more than shoulder width apart on the object. Your left / right hand should be palm up, and your other hand should be palm down. 12. Keep your elbow straight and your shoulder muscles relaxed. Push the stick up with your healthy arm to raise your left / right arm in front of your body, and then over your head until you feel a stretch in your shoulder (flexion). ? Avoid shrugging your shoulder while you raise your arm. Keep your shoulder blade tucked down toward the middle of your back. 13. Hold for __________ seconds. 14. Slowly return to the starting position. Repeat __________ times. Complete this exercise __________ times a day. Shoulder abduction, standing 8. Stand and hold a broomstick, a cane, or a similar object. Place your hands a little more than shoulder width apart on the  object. Your left / right hand should be palm up, and your other hand should be palm down. 9. Keep your elbow straight and your shoulder muscles relaxed. Push the object across your body toward your left / right side. Raise your left / right arm to the side of your body (abduction) until you feel a stretch in your shoulder. ? Do not raise your arm above shoulder height unless your health care provider tells you to do that. ?  If directed, raise your arm over your head. ? Avoid shrugging your shoulder while you raise your arm. Keep your shoulder blade tucked down toward the middle of your back. 10. Hold for __________ seconds. 11. Slowly return to the starting position. Repeat __________ times. Complete this exercise __________ times a day. Internal rotation  7. Place your left / right hand behind your back, palm up. 8. Use your other hand to dangle an exercise band, a towel, or a similar object over your shoulder. Grasp the band with your left / right hand so you are holding on to both ends. 9. Gently pull up on the band until you feel a stretch in the front of your left / right shoulder. The movement of your arm toward the center of your body is called internal rotation. ? Avoid shrugging your shoulder while you raise your arm. Keep your shoulder blade tucked down toward the middle of your back. 10. Hold for __________ seconds. 11. Release the stretch by letting go of the band and lowering your hands. Repeat __________ times. Complete this exercise __________ times a day. Strengthening exercises External rotation  6. Sit in a stable chair without armrests. 7. Secure an exercise band to a stable object at elbow height on your left / right side. 8. Place a soft object, such as a folded towel or a small pillow, between your left / right upper arm and your body to move your elbow about 4 inches (10 cm) away from your side. 9. Hold the end of the exercise band so it is tight and there is no  slack. 10. Keeping your elbow pressed against the soft object, slowly move your forearm out, away from your abdomen (external rotation). Keep your body steady so only your forearm moves. 11. Hold for __________ seconds. 12. Slowly return to the starting position. Repeat __________ times. Complete this exercise __________ times a day. Shoulder abduction  5. Sit in a stable chair without armrests, or stand up. 6. Hold a __________ weight in your left / right hand, or hold an exercise band with both hands. 7. Start with your arms straight down and your left / right palm facing in, toward your body. 8. Slowly lift your left / right hand out to your side (abduction). Do not lift your hand above shoulder height unless your health care provider tells you that this is safe. ? Keep your arms straight. ? Avoid shrugging your shoulder while you do this movement. Keep your shoulder blade tucked down toward the middle of your back. 9. Hold for __________ seconds. 10. Slowly lower your arm, and return to the starting position. Repeat __________ times. Complete this exercise __________ times a day. Shoulder extension 5. Sit in a stable chair without armrests, or stand up. 6. Secure an exercise band to a stable object in front of you so it is at shoulder height. 7. Hold one end of the exercise band in each hand. Your palms should face each other. 8. Straighten your elbows and lift your hands up to shoulder height. 9. Step back, away from the secured end of the exercise band, until the band is tight and there is no slack. 10. Squeeze your shoulder blades together as you pull your hands down to the sides of your thighs (extension). Stop when your hands are straight down by your sides. Do not let your hands go behind your body. 11. Hold for __________ seconds. 12. Slowly return to the starting position. Repeat __________ times. Complete  this exercise __________ times a day. Shoulder row 5. Sit in a stable  chair without armrests, or stand up. 6. Secure an exercise band to a stable object in front of you so it is at waist height. 7. Hold one end of the exercise band in each hand. Position your palms so that your thumbs are facing the ceiling (neutral position). 8. Bend each of your elbows to a 90-degree angle (right angle) and keep your upper arms at your sides. 9. Step back until the band is tight and there is no slack. 10. Slowly pull your elbows back behind you. 11. Hold for __________ seconds. 12. Slowly return to the starting position. Repeat __________ times. Complete this exercise __________ times a day. Shoulder press-ups  7. Sit in a stable chair that has armrests. Sit upright, with your feet flat on the floor. 8. Put your hands on the armrests so your elbows are bent and your fingers are pointing forward. Your hands should be about even with the sides of your body. 9. Push down on the armrests and use your arms to lift yourself off the chair. Straighten your elbows and lift yourself up as much as you comfortably can. ? Move your shoulder blades down, and avoid letting your shoulders move up toward your ears. ? Keep your feet on the ground. As you get stronger, your feet should support less of your body weight as you lift yourself up. 10. Hold for __________ seconds. 11. Slowly lower yourself back into the chair. Repeat __________ times. Complete this exercise __________ times a day. Wall push-ups  6. Stand so you are facing a stable wall. Your feet should be about one arm-length away from the wall. 7. Lean forward and place your palms on the wall at shoulder height. 8. Keep your feet flat on the floor as you bend your elbows and lean forward toward the wall. 9. Hold for __________ seconds. 10. Straighten your elbows to push yourself back to the starting position. Repeat __________ times. Complete this exercise __________ times a day. This information is not intended to replace  advice given to you by your health care provider. Make sure you discuss any questions you have with your health care provider. Document Revised: 05/07/2018 Document Reviewed: 02/12/2018 Elsevier Patient Education  [place].

## 2019-03-28 ENCOUNTER — Other Ambulatory Visit: Payer: Self-pay | Admitting: Family Medicine

## 2019-03-28 DIAGNOSIS — I10 Essential (primary) hypertension: Secondary | ICD-10-CM

## 2019-03-28 MED FILL — HYDROCHLOROTHIAZIDE 25 MG T: 25 | 30 days supply | Qty: 30 | Fill #0

## 2019-04-11 ENCOUNTER — Other Ambulatory Visit (INDEPENDENT_AMBULATORY_CARE_PROVIDER_SITE_OTHER): Payer: 59

## 2019-04-11 ENCOUNTER — Other Ambulatory Visit: Payer: Self-pay

## 2019-04-11 DIAGNOSIS — E559 Vitamin D deficiency, unspecified: Secondary | ICD-10-CM | POA: Diagnosis not present

## 2019-04-11 DIAGNOSIS — E538 Deficiency of other specified B group vitamins: Secondary | ICD-10-CM

## 2019-04-11 LAB — VITAMIN B12: Vitamin B-12: 235 pg/mL (ref 211–911)

## 2019-04-11 LAB — VITAMIN D 25 HYDROXY (VIT D DEFICIENCY, FRACTURES): VITD: 44.69 ng/mL (ref 30.00–100.00)

## 2019-04-25 MED FILL — HYDROCHLOROTHIAZIDE 25 MG T: 25 | 30 days supply | Qty: 30 | Fill #1

## 2019-06-02 MED FILL — HYDROCHLOROTHIAZIDE 25 MG T: 25 | 30 days supply | Qty: 30 | Fill #2

## 2019-06-29 MED FILL — HYDROCHLOROTHIAZIDE 25 MG T: 25 | 30 days supply | Qty: 30 | Fill #3

## 2019-07-27 MED FILL — HYDROCHLOROTHIAZIDE 25 MG T: 25 | 30 days supply | Qty: 30 | Fill #4

## 2019-09-01 MED FILL — HYDROCHLOROTHIAZIDE 25 MG T: 25 | 30 days supply | Qty: 30 | Fill #5

## 2019-09-07 NOTE — Progress Notes (Signed)
Office Visit Note  Patient: Stephanie Castro             Date of Birth: 1965/06/02           MRN: [id number]             PCP: Stephanie Pal, DO Referring: Stephanie Castro* Visit Date: 09/20/2019 Occupation: @GUAROCC @  Subjective:  Pain in multiple joints.   History of Present Illness: Stephanie Castro is a 54 y.o. female with history of osteoarthritis.  She states she continues to have some discomfort in her right shoulder, bilateral hands, knee joints and her ankles.  She has not noticed any joint swelling.  She gives history of some stiffness.  She denies any history of oral ulcers, nasal ulcers, malar rash, Raynaud's phenomenon or photosensitivity.  Activities of Daily Living:  Patient reports morning stiffness for several  hours.   Patient Reports nocturnal pain.  Difficulty dressing/grooming: Denies Difficulty climbing stairs: Denies Difficulty getting out of chair: Denies Difficulty using hands for taps, buttons, cutlery, and/or writing: Denies  Review of Systems  Constitutional: Negative for fatigue.  HENT: Negative for mouth sores, mouth dryness and nose dryness.   Eyes: Positive for dryness.  Respiratory: Negative for shortness of breath and difficulty breathing.   Cardiovascular: Positive for palpitations. Negative for chest pain.  Gastrointestinal: Negative for blood in stool, constipation and diarrhea.  Endocrine: Negative for increased urination.  Genitourinary: Negative for difficulty urinating.  Musculoskeletal: Positive for arthralgias, joint pain, joint swelling and morning stiffness. Negative for myalgias, muscle tenderness and myalgias.  Skin: Negative for color change, rash and redness.  Allergic/Immunologic: Negative for susceptible to infections.  Neurological: Positive for numbness. Negative for dizziness, headaches, memory loss and weakness.  Hematological: Positive for bruising/bleeding tendency.  Psychiatric/Behavioral: Negative for  confusion.    PMFS History:  Patient Active Problem List   Diagnosis Date Noted   Lateral epicondylitis of both elbows 08/06/2018   Concern about female breast disease without diagnosis 03/17/2018   Essential hypertension 02/26/2017   Exercise-induced asthma    DOE (dyspnea on exertion) 08/08/2015   Elevated BP without diagnosis of hypertension 06/15/2015   Weight gain 04/24/2015   Primary localized osteoarthritis of right knee 10/13/2014   Squamous cell carcinoma, face 02/24/2014   Vitamin D deficiency 02/24/2014   Fatigue 07/06/2013   GERD (gastroesophageal reflux disease) 07/06/2013   Vitamin B12 deficiency without anemia 05/15/2013   BMI 35.0-35.9,adult 05/15/2013   Perimenopausal 05/15/2013   Low TSH level 04/22/2013   Breast cancer screening, high risk patient 04/04/2013   Periodic edema 04/04/2013   Other and unspecified hyperlipidemia 04/06/2011    Past Medical History:  Diagnosis Date   Arthritis    both knees , back and elbows   Dysplastic nevus    beneath r eye   Exercise-induced asthma    GERD (gastroesophageal reflux disease)    Hypertension    Primary localized osteoarthritis of right knee 10/13/2014   Squamous cell carcinoma, face 02/24/2014   Tear of medial meniscus of right knee 10/13/2014    Family History  Problem Relation Age of Onset   Hyperlipidemia Mother    Breast cancer Mother 47       breast   Heart attack Father    COPD Father    Heart attack Sister    Hyperlipidemia Sister    Hypertension Sister    Hyperlipidemia Maternal Aunt    Breast cancer Maternal Aunt  breast   Hypertension Maternal Uncle    Diabetes Maternal Uncle    Colon cancer Maternal Uncle        colon   Hyperlipidemia Maternal Grandmother    Diabetes Maternal Grandmother    Breast cancer Maternal Grandmother        breast   Diabetes Maternal Grandfather    Heart attack Sister    Sudden death Neg Hx    Past Surgical  History:  Procedure Laterality Date   ABDOMINAL HYSTERECTOMY  2007   KNEE ARTHROSCOPY WITH MEDIAL MENISECTOMY Right 10/13/2014   Procedure: RIGHT KNEE ARTHROSCOPY WITH PARTIAL MEDIAL MENISECTOMY AND CHONDROPLASTY ;  Surgeon: Marchia Bond, MD;  Location: [facility];  Service: Orthopedics;  Laterality: Right;   Social History   Social History Narrative   Married to Carlton.    Immunization History  Administered Date(s) Administered   Tdap 02/15/2008     Objective: Vital Signs: BP 128/78 (BP Location: Left Arm, Patient Position: Sitting, Cuff Size: Normal)    Pulse (!) 53    Resp 15    Ht 5' 1.5" (1.562 m)    Wt 177 lb 6.4 oz (80.5 kg)    BMI 32.98 kg/m    Physical Exam Vitals and nursing note reviewed.  Constitutional:      Appearance: She is well-developed.  HENT:     Head: Normocephalic and atraumatic.  Eyes:     Conjunctiva/sclera: Conjunctivae normal.  Cardiovascular:     Rate and Rhythm: Normal rate and regular rhythm.     Heart sounds: Normal heart sounds.  Pulmonary:     Effort: Pulmonary effort is normal.     Breath sounds: Normal breath sounds.  Abdominal:     General: Bowel sounds are normal.     Palpations: Abdomen is soft.  Musculoskeletal:     Cervical back: Normal range of motion.  Lymphadenopathy:     Cervical: No cervical adenopathy.  Skin:    General: Skin is warm and dry.     Capillary Refill: Capillary refill takes less than 2 seconds.  Neurological:     Mental Status: She is alert and oriented to person, place, and time.  Psychiatric:        Behavior: Behavior normal.      Musculoskeletal Exam: C-spine was in good range of motion.  Shoulder joints were in good range of motion with some discomfort in her right shoulder joint.  Elbow joints were in good range of motion.  She has mild tenderness over bilateral lateral epicondyle region.  She has bilateral PIP and DIP thickening.  No synovitis was noted.  Hip joints and knee joints with  good range of motion.  She has tenderness over left trochanteric bursa.  Her joints are in good range of motion.  She has bilateral pes cavus and PIP and DIP thickening.  CDAI Exam: CDAI Score: -- Patient Global: --; Provider Global: -- Swollen: --; Tender: -- Joint Exam 09/20/2019   No joint exam has been documented for this visit   There is currently no information documented on the homunculus. Go to the Rheumatology activity and complete the homunculus joint exam.  Investigation: No additional findings.  Imaging: No results found.  Recent Labs: Lab Results  Component Value Date   WBC 6.0 09/14/2019   HGB 12.6 09/14/2019   PLT 171.0 09/14/2019   NA 138 09/14/2019   K 4.1 09/14/2019   CL 103 09/14/2019   CO2 27 09/14/2019   GLUCOSE 74 09/14/2019  BUN 9 09/14/2019   CREATININE 0.72 09/14/2019   BILITOT 0.7 09/14/2019   ALKPHOS 60 09/14/2019   AST 16 09/14/2019   ALT 9 09/14/2019   PROT 6.3 09/14/2019   ALBUMIN 4.0 09/14/2019   CALCIUM 9.0 09/14/2019   GFRAA 98 02/03/2019    Speciality Comments: No specialty comments available.  Procedures:  No procedures performed Allergies: Penicillins   Assessment / Plan:     Visit Diagnoses: Polyarthralgia - History of joint pain for many years.  There is no history of joint swelling.  Patient had no synovitis on examination.  Rheumatoid factor 17.  Uric acid 7.6.  Primary osteoarthritis of both hands - X-ray of bilateral hands were consistent with osteoarthritis.  She has bilateral PIP and DIP thickening and CMC prominence.  Joint protection muscle strengthening was discussed.  Handout on hand exercises was given.  Natural supplements were discussed.  Chronic pain of both shoulders - X-ray of bilateral shoulders were unremarkable.  She continues to have some discomfort in her right shoulder.  Have given her a handout on shoulder joint exercises.  Trochanteric bursitis, left hip-IT band exercises were demonstrated at the last  visit.  She states that she has been doing her stretches.  She has noticed some improvement.  Primary osteoarthritis of both knees - Right knee joint showed severe osteoarthritis and left knee joint showed moderate osteoarthritis. Bilateral knee joint showed severe chondromalacia patella.  Muscle strengthening was emphasized.  Pain in right foot - X-ray of the foot was unremarkable except for mild osteoarthritic changes.  She has some dorsal spurring and pes cavus.  Proper fitting shoes with arch support were discussed.  Arthropathy of lumbar facet joint-she has chronic lower back pain.  Lateral epicondylitis of both elbows - History of cortisone injections in the past.  A handout on exercises was given.  Positive ANA (antinuclear antibody) - ANA 1: 40NS not a significant titer.  ENA completely negative, complements normal.  She has no clinical features of autoimmune disease.  Squamous cell carcinoma, face  Essential hypertension  Vitamin D deficiency  Vitamin B12 deficiency without anemia  Exercise-induced asthma  History of gastroesophageal reflux (GERD)  Family history of rheumatoid arthritis  Orders: No orders of the defined types were placed in this encounter.  No orders of the defined types were placed in this encounter.   Follow-Up Instructions: Return in about 1 year (around 09/19/2020) for Osteoarthritis.   Bo Merino, MD  Note - This record has been created using Editor, commissioning.  Chart creation errors have been sought, but may not always  have been located. Such creation errors do not reflect on  the standard of medical care.

## 2019-09-14 ENCOUNTER — Ambulatory Visit: Payer: 59 | Admitting: Family Medicine

## 2019-09-14 ENCOUNTER — Other Ambulatory Visit: Payer: Self-pay

## 2019-09-14 ENCOUNTER — Encounter: Payer: Self-pay | Admitting: Family Medicine

## 2019-09-14 VITALS — BP 128/70 | HR 86 | Temp 98.2°F | Ht 62.0 in | Wt 177.0 lb

## 2019-09-14 DIAGNOSIS — R002 Palpitations: Secondary | ICD-10-CM

## 2019-09-14 LAB — CBC
HCT: 37.6 % (ref 36.0–46.0)
Hemoglobin: 12.6 g/dL (ref 12.0–15.0)
MCHC: 33.6 g/dL (ref 30.0–36.0)
MCV: 92.4 fl (ref 78.0–100.0)
Platelets: 171 10*3/uL (ref 150.0–400.0)
RBC: 4.07 Mil/uL (ref 3.87–5.11)
RDW: 13.2 % (ref 11.5–15.5)
WBC: 6 10*3/uL (ref 4.0–10.5)

## 2019-09-14 LAB — COMPREHENSIVE METABOLIC PANEL
ALT: 9 U/L (ref 0–35)
AST: 16 U/L (ref 0–37)
Albumin: 4 g/dL (ref 3.5–5.2)
Alkaline Phosphatase: 60 U/L (ref 39–117)
BUN: 9 mg/dL (ref 6–23)
CO2: 27 mEq/L (ref 19–32)
Calcium: 9 mg/dL (ref 8.4–10.5)
Chloride: 103 mEq/L (ref 96–112)
Creatinine, Ser: 0.72 mg/dL (ref 0.40–1.20)
GFR: 84.04 mL/min (ref >60.00)
Glucose, Bld: 74 mg/dL (ref 70–99)
Potassium: 4.1 mEq/L (ref 3.5–5.1)
Sodium: 138 mEq/L (ref 135–145)
Total Bilirubin: 0.7 mg/dL (ref 0.2–1.2)
Total Protein: 6.3 g/dL (ref 6.0–8.3)

## 2019-09-14 LAB — MAGNESIUM: Magnesium: 2 mg/dL (ref 1.5–2.5)

## 2019-09-14 LAB — TSH: TSH: 3.07 u[IU]/mL (ref 0.35–4.50)

## 2019-09-14 NOTE — Patient Instructions (Signed)
Give [place] 2-3 business days to get the results of your labs back.   If labs are normal, we will set you up with the cardiology team.  Let me know if anything changes with your symptoms.  Send me a message if you would like to pursue a plastic surgery consultation.   Let us know if you need anything.

## 2019-09-14 NOTE — Progress Notes (Signed)
Chief Complaint  Patient presents with  . Follow-up    heart palpitations    Stephanie Castro is a 54 y.o. female here for palpitations.  Length of issue: 2 weeks How long does it last: several seconds Light headedness/passing out? No Chest pain/shortness of breath? No Hx of arrhythmia? No Medication changes/illicit substances? No  No sig caffeine or alcohol use.  Stress levels were great on vacation and she still had s/s's.   Past Medical History:  Diagnosis Date  . Arthritis    both knees , back and elbows  . Dysplastic nevus    beneath r eye  . Exercise-induced asthma   . GERD (gastroesophageal reflux disease)   . Hypertension   . Primary localized osteoarthritis of right knee 10/13/2014  . Squamous cell carcinoma, face 02/24/2014  . Tear of medial meniscus of right knee 10/13/2014   Past Surgical History:  Procedure Laterality Date  . ABDOMINAL HYSTERECTOMY  2007  . KNEE ARTHROSCOPY WITH MEDIAL MENISECTOMY Right 10/13/2014   Procedure: RIGHT KNEE ARTHROSCOPY WITH PARTIAL MEDIAL MENISECTOMY AND CHONDROPLASTY ;  Surgeon: Marchia Bond, MD;  Location: [facility];  Service: Orthopedics;  Laterality: Right;   Allergies  Allergen Reactions  . Penicillins    Allergies as of 09/14/2019      Reactions   Penicillins       Medication List       Accurate as of September 14, 2019 12:06 PM. If you have any questions, ask your nurse or doctor.        albuterol 108 (90 Base) MCG/ACT inhaler Commonly known as: VENTOLIN HFA Inhale 1-2 puffs into the lungs every 6 (six) hours as needed for wheezing or shortness of breath.   cyanocobalamin 1000 MCG/ML injection Commonly known as: (VITAMIN B-12) INJECT 1 ML (1,000 MCG TOTAL) INTO THE MUSCLE EVERY 2 WEEKS   hydrochlorothiazide 25 MG tablet Commonly known as: HYDRODIURIL TAKE 1 TABLET BY MOUTH ONCE DAILY **NEED OFFICE VISIT FOR MORE REFILLS**   Pennsaid 2 % Soln Generic drug: Diclofenac Sodium Place 1 application  onto the skin 2 (two) times daily.   potassium chloride 10 MEQ tablet Commonly known as: Klor-Con M10 Take 2 tablets (20 mEq total) by mouth daily.   VITAMIN D PO Take by mouth.       BP 128/70 (BP Location: Left Arm, Patient Position: Sitting, Cuff Size: Normal)   Pulse 86   Temp 98.2 F (36.8 C) (Oral)   Ht 5\' 2"  (1.575 m)   Wt 177 lb (80.3 kg)   SpO2 98%   BMI 32.37 kg/m  Gen: Awake, alert, appearing stated age Eyes: PERRLA Mouth: MMM Heart: RRR, no bruits, no LE edema Lungs: CTAB, no accessory muscle use Neuro: No cerebellar signs MSK: No muscle group atrophy or asymmetry Psych: Age appropriate judgment and insight, mood/affect WNL  Palpitations - Plan: CBC, Comprehensive metabolic panel, TSH, Magnesium  EKG not obtained as we discussed the low likelihood of catching her few seconds of palpitations w no trigger. Ck labs. If neg, will refer to cardiology for potential long term monitor.  F/u as originally scheduled.  The patient voiced understanding and agreement to the plan.  [name] 12:06 PM 09/14/19

## 2019-09-15 NOTE — Addendum Note (Signed)
Addended by: Jeronimo Greaves on: 09/15/2019 09:37 AM   Modules accepted: Orders

## 2019-09-20 ENCOUNTER — Other Ambulatory Visit: Payer: Self-pay

## 2019-09-20 ENCOUNTER — Ambulatory Visit: Payer: 59 | Admitting: Rheumatology

## 2019-09-20 ENCOUNTER — Encounter: Payer: Self-pay | Admitting: Rheumatology

## 2019-09-20 VITALS — BP 128/78 | HR 53 | Resp 15 | Ht 61.5 in | Wt 177.4 lb

## 2019-09-20 DIAGNOSIS — M2550 Pain in unspecified joint: Secondary | ICD-10-CM | POA: Diagnosis not present

## 2019-09-20 DIAGNOSIS — M25511 Pain in right shoulder: Secondary | ICD-10-CM

## 2019-09-20 DIAGNOSIS — M7712 Lateral epicondylitis, left elbow: Secondary | ICD-10-CM

## 2019-09-20 DIAGNOSIS — Z8719 Personal history of other diseases of the digestive system: Secondary | ICD-10-CM

## 2019-09-20 DIAGNOSIS — I10 Essential (primary) hypertension: Secondary | ICD-10-CM

## 2019-09-20 DIAGNOSIS — M170 Bilateral primary osteoarthritis of knee: Secondary | ICD-10-CM

## 2019-09-20 DIAGNOSIS — R7689 Other specified abnormal immunological findings in serum: Secondary | ICD-10-CM

## 2019-09-20 DIAGNOSIS — E538 Deficiency of other specified B group vitamins: Secondary | ICD-10-CM

## 2019-09-20 DIAGNOSIS — M79671 Pain in right foot: Secondary | ICD-10-CM

## 2019-09-20 DIAGNOSIS — R768 Other specified abnormal immunological findings in serum: Secondary | ICD-10-CM

## 2019-09-20 DIAGNOSIS — G8929 Other chronic pain: Secondary | ICD-10-CM

## 2019-09-20 DIAGNOSIS — Z8261 Family history of arthritis: Secondary | ICD-10-CM

## 2019-09-20 DIAGNOSIS — M19041 Primary osteoarthritis, right hand: Secondary | ICD-10-CM

## 2019-09-20 DIAGNOSIS — J45990 Exercise induced bronchospasm: Secondary | ICD-10-CM

## 2019-09-20 DIAGNOSIS — E559 Vitamin D deficiency, unspecified: Secondary | ICD-10-CM

## 2019-09-20 DIAGNOSIS — M7711 Lateral epicondylitis, right elbow: Secondary | ICD-10-CM

## 2019-09-20 DIAGNOSIS — M7062 Trochanteric bursitis, left hip: Secondary | ICD-10-CM

## 2019-09-20 DIAGNOSIS — M19042 Primary osteoarthritis, left hand: Secondary | ICD-10-CM

## 2019-09-20 DIAGNOSIS — M47816 Spondylosis without myelopathy or radiculopathy, lumbar region: Secondary | ICD-10-CM

## 2019-09-20 DIAGNOSIS — M25512 Pain in left shoulder: Secondary | ICD-10-CM

## 2019-09-20 DIAGNOSIS — C44320 Squamous cell carcinoma of skin of unspecified parts of face: Secondary | ICD-10-CM

## 2019-09-20 NOTE — Patient Instructions (Signed)
Elbow and Forearm Exercises Ask your health care provider which exercises are safe for you. Do exercises exactly as told by your health care provider and adjust them as directed. It is normal to feel mild stretching, pulling, tightness, or discomfort as you do these exercises. Stop right away if you feel sudden pain or your pain gets worse. Do not begin these exercises until told by your health care provider. Range-of-motion exercises These exercises warm up your muscles and joints and improve the movement and flexibility of your injured elbow and forearm. The exercises also help to relieve pain, numbness, and tingling. These exercises are done using the muscles in your injured elbow and forearm (active). Elbow flexion, active 1. Hold your left / right arm at your side, and bend your elbow (flexion) as far as you can using only your arm muscles. 2. Hold this position for __________ seconds. 3. Slowly return to the starting position. Repeat __________ times. Complete this exercise __________ times a day. Elbow extension, active 1. Hold your left / right arm at your side, and straighten your elbow (extension) as much as you can using only your arm muscles. 2. Hold this position for __________ seconds. 3. Slowly return to the starting position. Repeat __________ times. Complete this exercise __________ times a day. Active forearm rotation, supination This is an exercise in which you turn (rotate) your forearm palm up (supination). 1. Stand or sit with your elbows at your sides. 2. Bend your left / right elbow to a 90-degree angle (right angle). 3. Rotate your palm up until you feel a gentle stretch on the inside of your forearm. 4. Hold this position for __________ seconds. 5. Slowly return to the starting position. Repeat __________ times. Complete this exercise __________ times a day. Active forearm rotation, pronation This is an exercise in which you turn (rotate) your forearm palm down  (pronation). 1. Stand or sit with your elbows at your sides. 2. Bend your left / right elbow to a 90-degree angle (right angle). 3. Rotate your palm down until you feel a gentle stretch on the top of your forearm. 4. Hold this position for __________ seconds. 5. Slowly return to the starting position. Repeat __________ times. Complete this exercise __________ times a day. Stretching exercises These exercises warm up your muscles and joints and improve the movement and flexibility of your injured elbow and forearm. These exercises also help to relieve pain, numbness, and tingling. These exercises are done using your healthy elbow and forearm to help stretch the muscles in your injured elbow and forearm (active-assisted). Elbow flexion, active-assisted  1. Hold your left / right arm at your side, and bend your elbow (flexion) as much as you can using your left / right arm muscles. 2. Use your other hand to bend your left / right elbow farther. To do this, gently push up on your forearm until you feel a gentle stretch on the back of your elbow. 3. Hold this position for __________ seconds. 4. Slowly return to the starting position. Repeat __________ times. Complete this exercise __________ times a day. Elbow extension, active-assisted  1. Hold your left / right arm at your side, and straighten your elbow (extension) as much as you can using your left / right arm muscles. 2. Use your other hand to straighten the left / right elbow farther. To do this, gently push down on your forearm until you feel a gentle stretch on the inside of your elbow. 3. Hold this position for __________  seconds. 4. Slowly return to the starting position. Repeat __________ times. Complete this exercise __________ times a day. Active-assisted forearm rotation, supination This is an exercise in which you turn (rotate) your forearm palm up (supination). 1. Sit with your left / right elbow bent in a 90-degree angle (right  angle) with your forearm resting on a table. 2. Keeping your upper body and shoulder still, rotate your forearm so your palm faces upward. 3. Use your other hand to help rotate your forearm further until you feel a gentle to moderate stretch. 4. Hold this position for __________ seconds. 5. Slowly release the stretch and return to the starting position. Repeat __________ times. Complete this exercise __________ times a day. Active-assisted forearm rotation, pronation This is an exercise in which you turn (rotate) your forearm palm down (pronation). 1. Sit with your left / right elbow bent in a 90-degree angle (right angle) with your forearm resting on a table. 2. Keeping your upper body and shoulder still, rotate your forearm so your palm faces the tabletop. 3. Use your other hand to help rotate your forearm further until you feel a gentle to moderate stretch. 4. Hold this position for __________ seconds. 5. Slowly release the stretch and return to the starting position. Repeat __________ times. Complete this exercise __________ times a day. Passive elbow flexion, supine 1. Lie on your back (supine position). 2. Extend your left / right arm up in the air, bracing it with your other hand. 3. Let your left / right hand slowly lower toward your shoulder (passive flexion), while your elbow stays pointed toward the ceiling. You should feel a gentle stretch along the back of your upper arm and elbow. 4. If instructed by your health care provider, you may increase the intensity of your stretch by adding a small wrist weight or hand weight. 5. Hold this position for __________ seconds. 6. Slowly return to the starting position. Repeat __________ times. Complete this exercise __________ times a day. Passive elbow extension, supine  1. Lie on your back (supine position). Make sure that you are in a comfortable position that lets you relax your arm muscles. 2. Place a folded towel under your left /  right upper arm so your elbow and shoulder are at the same height. Straighten your left / right arm so your elbow does not rest on the bed or towel. 3. Let the weight of your hand stretch your elbow (passive extension). Keep your arm and chest muscles relaxed. You should feel a stretch on the inside of your elbow. 4. If told by your health care provider, you may increase the intensity of your stretch by adding a small wrist weight or hand weight. 5. Hold this position for __________ seconds. 6. Slowly release the stretch. Repeat __________ times. Complete this exercise __________ times a day. Strengthening exercises These exercises build strength and endurance in your elbow and forearm. Endurance is the ability to use your muscles for a long time, even after they get tired. Elbow flexion, isometric  1. Stand or sit up straight. 2. Bend your left / right elbow in a 90-degree angle (right angle), and keep your forearm at the height of your waist. Your thumb should be pointed toward the ceiling (neutral forearm). 3. Place your other hand on top of your left / right forearm. Gently push down while you resist with your left / right arm (isometric flexion). Push as hard as you can with both arms without causing any pain or movement   at your left / right elbow. 4. Hold this position for __________ seconds. 5. Slowly release the tension in both arms. Let your muscles relax completely before you repeat the exercise. Repeat __________ times. Complete this exercise __________ times a day. Elbow extension, isometric  1. Stand or sit up straight. 2. Place your left / right arm so your palm faces your abdomen and is at the height of your waist. 3. Place your other hand on the underside of your left / right forearm. Gently push up while you resist with your left / right arm (isometric extension). Push as hard as you can with both arms without causing any pain or movement at your left / right elbow. 4. Hold this  position for __________ seconds. 5. Slowly release the tension in both arms. Let your muscles relax completely before you repeat the exercise. Repeat __________ times. Complete this exercise __________ times a day. Elbow flexion with forearm palm up  1. Sit on a firm chair without armrests, or stand up. 2. Place your left / right arm at your side with your elbow straight and your palm facing forward. 3. Holding a __________weight or gripping a rubber exercise band or tubing, bend your elbow to bring your hand toward your shoulder (flexion). 4. Hold this position for __________ seconds. 5. Slowly return to the starting position. Repeat __________ times. Complete this exercise __________ times a day. Elbow extension, active  1. Sit on a firm chair without armrests, or stand up. 2. Hold a rubber exercise band or tubing in both hands. 3. Keeping your upper arms at your sides, bring both hands up to your left / right shoulder. Keep your left / right hand just below your other hand. 4. Straighten your left / right elbow (extension) while keeping your other arm still. 5. Hold this position for __________ seconds. 6. Control the resistance of the band or tubing as you return to the starting position. Repeat __________ times. Complete this exercise __________ times a day. Forearm rotation, supination  1. Sit with your left / right forearm supported on a table. Your elbow should be at waist height and bent at a 90-degree angle (right angle). 2. Gently grasp a lightweight hammer. 3. Rest your hand over the edge of the table with your palm facing down. 4. Without moving your left / right elbow, slowly rotate your forearm to turn your palm up toward the ceiling (supination). 5. Hold this position for __________ seconds. 6. Slowly return to the starting position. Repeat __________ times. Complete this exercise __________ times a day. Forearm rotation, pronation  1. Sit with your left / right forearm  supported on a table. Keep your elbow below shoulder height. 2. Gently grasp a lightweight hammer. 3. Rest your hand over the edge of the table with your palm facing up. 4. Without moving your left / right elbow, slowly rotate your forearm to turn your palm down toward the floor (pronation). 5. Hold this position for __________seconds. 6. Slowly return to the starting position. Repeat __________ times. Complete this exercise __________ times a day. This information is not intended to replace advice given to you by your health care provider. Make sure you discuss any questions you have with your health care provider. Document Revised: 05/06/2018 Document Reviewed: 02/03/2018 [employer] Patient Education  2020 [employer] Hand Exercises Hand exercises can be helpful for almost anyone. These exercises can strengthen the hands, improve flexibility and movement, and increase blood flow to the hands. These results can   make work and daily tasks easier. Hand exercises can be especially helpful for people who have joint pain from arthritis or have nerve damage from overuse (carpal tunnel syndrome). These exercises can also help people who have injured a hand. Exercises Most of these hand exercises are gentle stretching and motion exercises. It is usually safe to do them often throughout the day. Warming up your hands before exercise may help to reduce stiffness. You can do this with gentle massage or by placing your hands in warm water for 10-15 minutes. It is normal to feel some stretching, pulling, tightness, or mild discomfort as you begin new exercises. This will gradually improve. Stop an exercise right away if you feel sudden, severe pain or your pain gets worse. Ask your health care provider which exercises are best for you. Knuckle bend or "claw" fist 1. Stand or sit with your arm, hand, and all five fingers pointed straight up. Make sure to keep your wrist straight during the exercise. 2. Gently  bend your fingers down toward your palm until the tips of your fingers are touching the top of your palm. Keep your big knuckle straight and just bend the small knuckles in your fingers. 3. Hold this position for __________ seconds. 4. Straighten (extend) your fingers back to the starting position. Repeat this exercise 5-10 times with each hand. Full finger fist 1. Stand or sit with your arm, hand, and all five fingers pointed straight up. Make sure to keep your wrist straight during the exercise. 2. Gently bend your fingers into your palm until the tips of your fingers are touching the middle of your palm. 3. Hold this position for __________ seconds. 4. Extend your fingers back to the starting position, stretching every joint fully. Repeat this exercise 5-10 times with each hand. Straight fist 1. Stand or sit with your arm, hand, and all five fingers pointed straight up. Make sure to keep your wrist straight during the exercise. 2. Gently bend your fingers at the big knuckle, where your fingers meet your hand, and the middle knuckle. Keep the knuckle at the tips of your fingers straight and try to touch the bottom of your palm. 3. Hold this position for __________ seconds. 4. Extend your fingers back to the starting position, stretching every joint fully. Repeat this exercise 5-10 times with each hand. Tabletop 1. Stand or sit with your arm, hand, and all five fingers pointed straight up. Make sure to keep your wrist straight during the exercise. 2. Gently bend your fingers at the big knuckle, where your fingers meet your hand, as far down as you can while keeping the small knuckles in your fingers straight. Think of forming a tabletop with your fingers. 3. Hold this position for __________ seconds. 4. Extend your fingers back to the starting position, stretching every joint fully. Repeat this exercise 5-10 times with each hand. Finger spread 1. Place your hand flat on a table with your palm  facing down. Make sure your wrist stays straight as you do this exercise. 2. Spread your fingers and thumb apart from each other as far as you can until you feel a gentle stretch. Hold this position for __________ seconds. 3. Bring your fingers and thumb tight together again. Hold this position for __________ seconds. Repeat this exercise 5-10 times with each hand. Making circles 1. Stand or sit with your arm, hand, and all five fingers pointed straight up. Make sure to keep your wrist straight during the exercise. 2. Make   a circle by touching the tip of your thumb to the tip of your index finger. 3. Hold for __________ seconds. Then open your hand wide. 4. Repeat this motion with your thumb and each finger on your hand. Repeat this exercise 5-10 times with each hand. Thumb motion 1. Sit with your forearm resting on a table and your wrist straight. Your thumb should be facing up toward the ceiling. Keep your fingers relaxed as you move your thumb. 2. Lift your thumb up as high as you can toward the ceiling. Hold for __________ seconds. 3. Bend your thumb across your palm as far as you can, reaching the tip of your thumb for the small finger (pinkie) side of your palm. Hold for __________ seconds. Repeat this exercise 5-10 times with each hand. Grip strengthening  1. Hold a stress ball or other soft ball in the middle of your hand. 2. Slowly increase the pressure, squeezing the ball as much as you can without causing pain. Think of bringing the tips of your fingers into the middle of your palm. All of your finger joints should bend when doing this exercise. 3. Hold your squeeze for __________ seconds, then relax. Repeat this exercise 5-10 times with each hand. Contact a health care provider if:  Your hand pain or discomfort gets much worse when you do an exercise.  Your hand pain or discomfort does not improve within 2 hours after you exercise. If you have any of these problems, stop doing  these exercises right away. Do not do them again unless your health care provider says that you can. Get help right away if:  You develop sudden, severe hand pain or swelling. If this happens, stop doing these exercises right away. Do not do them again unless your health care provider says that you can. This information is not intended to replace advice given to you by your health care provider. Make sure you discuss any questions you have with your health care provider. Document Revised: 05/06/2018 Document Reviewed: 01/14/2018 [employer] Patient Education  [place]. Shoulder Exercises Ask your health care provider which exercises are safe for you. Do exercises exactly as told by your health care provider and adjust them as directed. It is normal to feel mild stretching, pulling, tightness, or discomfort as you do these exercises. Stop right away if you feel sudden pain or your pain gets worse. Do not begin these exercises until told by your health care provider. Stretching exercises External rotation and abduction This exercise is sometimes called corner stretch. This exercise rotates your arm outward (external rotation) and moves your arm out from your body (abduction). 4. Stand in a doorway with one of your feet slightly in front of the other. This is called a staggered stance. If you cannot reach your forearms to the door frame, stand facing a corner of a room. 5. Choose one of the following positions as told by your health care provider: ? Place your hands and forearms on the door frame above your head. ? Place your hands and forearms on the door frame at the height of your head. ? Place your hands on the door frame at the height of your elbows. 6. Slowly move your weight onto your front foot until you feel a stretch across your chest and in the front of your shoulders. Keep your head and chest upright and keep your abdominal muscles tight. 7. Hold for __________ seconds. 8. To  release the stretch, shift your weight to  your back foot. Repeat __________ times. Complete this exercise __________ times a day. Extension, standing 4. Stand and hold a broomstick, a cane, or a similar object behind your back. ? Your hands should be a little wider than shoulder width apart. ? Your palms should face away from your back. 5. Keeping your elbows straight and your shoulder muscles relaxed, move the stick away from your body until you feel a stretch in your shoulders (extension). ? Avoid shrugging your shoulders while you move the stick. Keep your shoulder blades tucked down toward the middle of your back. 6. Hold for __________ seconds. 7. Slowly return to the starting position. Repeat __________ times. Complete this exercise __________ times a day. Range-of-motion exercises Pendulum  6. Stand near a wall or a surface that you can hold onto for balance. 7. Bend at the waist and let your left / right arm hang straight down. Use your other arm to support you. Keep your back straight and do not lock your knees. 8. Relax your left / right arm and shoulder muscles, and move your hips and your trunk so your left / right arm swings freely. Your arm should swing because of the motion of your body, not because you are using your arm or shoulder muscles. 9. Keep moving your hips and trunk so your arm swings in the following directions, as told by your health care provider: ? Side to side. ? Forward and backward. ? In clockwise and counterclockwise circles. 10. Continue each motion for __________ seconds, or for as long as told by your health care provider. 11. Slowly return to the starting position. Repeat __________ times. Complete this exercise __________ times a day. Shoulder flexion, standing  6. Stand and hold a broomstick, a cane, or a similar object. Place your hands a little more than shoulder width apart on the object. Your left / right hand should be palm up, and your other  hand should be palm down. 7. Keep your elbow straight and your shoulder muscles relaxed. Push the stick up with your healthy arm to raise your left / right arm in front of your body, and then over your head until you feel a stretch in your shoulder (flexion). ? Avoid shrugging your shoulder while you raise your arm. Keep your shoulder blade tucked down toward the middle of your back. 8. Hold for __________ seconds. 9. Slowly return to the starting position. Repeat __________ times. Complete this exercise __________ times a day. Shoulder abduction, standing 5. Stand and hold a broomstick, a cane, or a similar object. Place your hands a little more than shoulder width apart on the object. Your left / right hand should be palm up, and your other hand should be palm down. 6. Keep your elbow straight and your shoulder muscles relaxed. Push the object across your body toward your left / right side. Raise your left / right arm to the side of your body (abduction) until you feel a stretch in your shoulder. ? Do not raise your arm above shoulder height unless your health care provider tells you to do that. ? If directed, raise your arm over your head. ? Avoid shrugging your shoulder while you raise your arm. Keep your shoulder blade tucked down toward the middle of your back. 7. Hold for __________ seconds. 8. Slowly return to the starting position. Repeat __________ times. Complete this exercise __________ times a day. Internal rotation  5. Place your left / right hand behind your back, palm up. 6.  Use your other hand to dangle an exercise band, a towel, or a similar object over your shoulder. Grasp the band with your left / right hand so you are holding on to both ends. 7. Gently pull up on the band until you feel a stretch in the front of your left / right shoulder. The movement of your arm toward the center of your body is called internal rotation. ? Avoid shrugging your shoulder while you raise your  arm. Keep your shoulder blade tucked down toward the middle of your back. 8. Hold for __________ seconds. 9. Release the stretch by letting go of the band and lowering your hands. Repeat __________ times. Complete this exercise __________ times a day. Strengthening exercises External rotation  6. Sit in a stable chair without armrests. 7. Secure an exercise band to a stable object at elbow height on your left / right side. 8. Place a soft object, such as a folded towel or a small pillow, between your left / right upper arm and your body to move your elbow about 4 inches (10 cm) away from your side. 9. Hold the end of the exercise band so it is tight and there is no slack. 10. Keeping your elbow pressed against the soft object, slowly move your forearm out, away from your abdomen (external rotation). Keep your body steady so only your forearm moves. 11. Hold for __________ seconds. 12. Slowly return to the starting position. Repeat __________ times. Complete this exercise __________ times a day. Shoulder abduction  6. Sit in a stable chair without armrests, or stand up. 7. Hold a __________ weight in your left / right hand, or hold an exercise band with both hands. 8. Start with your arms straight down and your left / right palm facing in, toward your body. 9. Slowly lift your left / right hand out to your side (abduction). Do not lift your hand above shoulder height unless your health care provider tells you that this is safe. ? Keep your arms straight. ? Avoid shrugging your shoulder while you do this movement. Keep your shoulder blade tucked down toward the middle of your back. 10. Hold for __________ seconds. 11. Slowly lower your arm, and return to the starting position. Repeat __________ times. Complete this exercise __________ times a day. Shoulder extension 7. Sit in a stable chair without armrests, or stand up. 8. Secure an exercise band to a stable object in front of you so it is  at shoulder height. 9. Hold one end of the exercise band in each hand. Your palms should face each other. 10. Straighten your elbows and lift your hands up to shoulder height. 11. Step back, away from the secured end of the exercise band, until the band is tight and there is no slack. 12. Squeeze your shoulder blades together as you pull your hands down to the sides of your thighs (extension). Stop when your hands are straight down by your sides. Do not let your hands go behind your body. 13. Hold for __________ seconds. 14. Slowly return to the starting position. Repeat __________ times. Complete this exercise __________ times a day. Shoulder row 7. Sit in a stable chair without armrests, or stand up. 8. Secure an exercise band to a stable object in front of you so it is at waist height. 9. Hold one end of the exercise band in each hand. Position your palms so that your thumbs are facing the ceiling (neutral position). 10. Bend each of your  elbows to a 90-degree angle (right angle) and keep your upper arms at your sides. 11. Step back until the band is tight and there is no slack. 12. Slowly pull your elbows back behind you. 13. Hold for __________ seconds. 14. Slowly return to the starting position. Repeat __________ times. Complete this exercise __________ times a day. Shoulder press-ups  6. Sit in a stable chair that has armrests. Sit upright, with your feet flat on the floor. 7. Put your hands on the armrests so your elbows are bent and your fingers are pointing forward. Your hands should be about even with the sides of your body. 8. Push down on the armrests and use your arms to lift yourself off the chair. Straighten your elbows and lift yourself up as much as you comfortably can. ? Move your shoulder blades down, and avoid letting your shoulders move up toward your ears. ? Keep your feet on the ground. As you get stronger, your feet should support less of your body weight as you lift  yourself up. 9. Hold for __________ seconds. 10. Slowly lower yourself back into the chair. Repeat __________ times. Complete this exercise __________ times a day. Wall push-ups  6. Stand so you are facing a stable wall. Your feet should be about one arm-length away from the wall. 7. Lean forward and place your palms on the wall at shoulder height. 8. Keep your feet flat on the floor as you bend your elbows and lean forward toward the wall. 9. Hold for __________ seconds. 10. Straighten your elbows to push yourself back to the starting position. Repeat __________ times. Complete this exercise __________ times a day. This information is not intended to replace advice given to you by your health care provider. Make sure you discuss any questions you have with your health care provider. Document Revised: 05/07/2018 Document Reviewed: 02/12/2018 Elsevier Patient Education  [place].

## 2019-09-23 MED FILL — CYANOCOBALAMIN 1,000 MCG/ML: 1000 | 28 days supply | Qty: 2 | Fill #2

## 2019-09-30 ENCOUNTER — Other Ambulatory Visit: Payer: Self-pay | Admitting: Family Medicine

## 2019-09-30 DIAGNOSIS — I10 Essential (primary) hypertension: Secondary | ICD-10-CM

## 2019-09-30 MED FILL — HYDROCHLOROTHIAZIDE 25 MG T: 25 | 30 days supply | Qty: 30 | Fill #0

## 2019-10-12 ENCOUNTER — Ambulatory Visit: Payer: 59 | Admitting: Cardiology

## 2019-10-19 ENCOUNTER — Ambulatory Visit (INDEPENDENT_AMBULATORY_CARE_PROVIDER_SITE_OTHER): Payer: 59

## 2019-10-19 ENCOUNTER — Encounter: Payer: Self-pay | Admitting: Cardiology

## 2019-10-19 ENCOUNTER — Other Ambulatory Visit: Payer: Self-pay | Admitting: Cardiology

## 2019-10-19 ENCOUNTER — Other Ambulatory Visit: Payer: Self-pay

## 2019-10-19 ENCOUNTER — Ambulatory Visit: Payer: 59 | Admitting: Cardiology

## 2019-10-19 VITALS — BP 126/68 | HR 47 | Ht 62.0 in | Wt 177.1 lb

## 2019-10-19 DIAGNOSIS — R002 Palpitations: Secondary | ICD-10-CM

## 2019-10-19 DIAGNOSIS — I10 Essential (primary) hypertension: Secondary | ICD-10-CM

## 2019-10-19 DIAGNOSIS — Z1239 Encounter for other screening for malignant neoplasm of breast: Secondary | ICD-10-CM

## 2019-10-19 DIAGNOSIS — R011 Cardiac murmur, unspecified: Secondary | ICD-10-CM

## 2019-10-19 HISTORY — DX: Palpitations: R00.2

## 2019-10-19 HISTORY — DX: Cardiac murmur, unspecified: R01.1

## 2019-10-19 NOTE — Addendum Note (Signed)
Addended by: Truddie Hidden on: 10/19/2019 03:59 PM   Modules accepted: Orders

## 2019-10-19 NOTE — Progress Notes (Signed)
Cardiology Office Note:    Date:  10/19/2019   ID:  Stephanie Castro, DOB [birth year], MRN [id number]  PCP:  [name], DO  Cardiologist:  Jenean Lindau, MD   Referring MD: [name]*    ASSESSMENT:    1. Essential hypertension   2. Palpitations   3. Cardiac murmur    PLAN:    In order of problems listed above:  1. Primary prevention stressed with the patient. Importance of compliance with diet medication stressed and she vocalized understanding. She has excellent exercise protocol and asked her to continue doing it. She is overweight. Weight reduction was stressed and she promises to do better. 2. Palpitations: I noted that her TSH is fine I will do a 30-day monitor to assess her symptoms. They appear benign to me and I reassured her. 3. Cardiac murmur: Echocardiogram will be done to assess murmur on auscultation. 4. Patient will be seen in follow-up appointment in 3 months or earlier if the patient has any concerns    Medication Adjustments/Labs and Tests Ordered: Current medicines are reviewed at length with the patient today.  Concerns regarding medicines are outlined above.  No orders of the defined types were placed in this encounter.  No orders of the defined types were placed in this encounter.    History of Present Illness:    Stephanie Castro is a 54 y.o. female who is being seen today for the evaluation of palpitations at the request of [name]*. Patient is a pleasant 54 year old female. She has past medical history of rheumatoid arthritis. She is sent here for evaluation for palpitations. The patient mentions to me almost on a regular basis she feels a skipped beat-like sensation. No orthopnea or PND. She is not exactly complaining of a fast heartbeat. At the time of my evaluation, the patient is alert awake oriented and in no distress. She has an excellent exercise schedule and walks 3 to 4 miles a day without any  problems.  Past Medical History:  Diagnosis Date   Arthritis    both knees , back and elbows   Dysplastic nevus    beneath r eye   Exercise-induced asthma    GERD (gastroesophageal reflux disease)    Hypertension    Primary localized osteoarthritis of right knee 10/13/2014   Squamous cell carcinoma, face 02/24/2014   Tear of medial meniscus of right knee 10/13/2014    Past Surgical History:  Procedure Laterality Date   ABDOMINAL HYSTERECTOMY  2007   KNEE ARTHROSCOPY WITH MEDIAL MENISECTOMY Right 10/13/2014   Procedure: RIGHT KNEE ARTHROSCOPY WITH PARTIAL MEDIAL MENISECTOMY AND CHONDROPLASTY ;  Surgeon: Marchia Bond, MD;  Location: [facility];  Service: Orthopedics;  Laterality: Right;    Current Medications: Current Meds  Medication Sig   cyanocobalamin (,VITAMIN B-12,) 1000 MCG/ML injection INJECT 1 ML (1,000 MCG TOTAL) INTO THE MUSCLE EVERY 2 WEEKS   Diclofenac Sodium (PENNSAID) 2 % SOLN Place 1 application onto the skin 2 (two) times daily.   hydrochlorothiazide (HYDRODIURIL) 25 MG tablet TAKE 1 TABLET BY MOUTH ONCE DAILY **NEED OFFICE VISIT FOR MORE REFILLS**     Allergies:   Penicillins   Social History   Socioeconomic History   Marital status: Married    Spouse name: Not on file   Number of children: Not on file   Years of education: Not on file   Highest education level: Not on file  Occupational History   Not on file  Tobacco Use   Smoking status: Former Smoker    Packs/day: 1.00    Years: 20.00    Pack years: 20.00    Types: Cigarettes    Quit date: 2005    Years since quitting: 16.7   Smokeless tobacco: Never Used  Scientific laboratory technician Use: Never used  Substance and Sexual Activity   Alcohol use: Yes    Alcohol/week: 0.0 standard drinks    Comment: social   Drug use: No   Sexual activity: Yes    Birth control/protection: Surgical  Other Topics Concern   Not on file  Social History Narrative   Married to  [place].    Social Determinants of Health   Financial Resource Strain:    Difficulty of Paying Living Expenses: Not on file  Food Insecurity:    Worried About Charity fundraiser in the Last Year: Not on file   [employer] of Food in the Last Year: Not on file  Transportation Needs:    Lack of Transportation (Medical): Not on file   Lack of Transportation (Non-Medical): Not on file  Physical Activity:    Days of Exercise per Week: Not on file   Minutes of Exercise per Session: Not on file  Stress:    Feeling of Stress : Not on file  Social Connections:    Frequency of Communication with Friends and Family: Not on file   Frequency of Social Gatherings with Friends and Family: Not on file   Attends Religious Services: Not on file   Active Member of Clubs or Organizations: Not on file   Attends Archivist Meetings: Not on file   Marital Status: Not on file     Family History: The patient's family history includes Breast cancer in her maternal aunt and maternal grandmother; Breast cancer (age of onset: 37) in her mother; COPD in her father; Colon cancer in her maternal uncle; Diabetes in her maternal grandfather, maternal grandmother, and maternal uncle; Heart attack in her father, sister, and sister; Hyperlipidemia in her maternal aunt, maternal grandmother, mother, and sister; Hypertension in her maternal uncle and sister. There is no history of Sudden death.  ROS:   Please see the history of present illness.    All other systems reviewed and are negative.  EKGs/Labs/Other Studies Reviewed:    The following studies were reviewed today: EKG reveals sinus bradycardia and nonspecific ST-T changes   Recent Labs: 09/14/2019: ALT 9; BUN 9; Creatinine, Ser 0.72; Hemoglobin 12.6; Magnesium 2.0; Platelets 171.0; Potassium 4.1; Sodium 138; TSH 3.07  Recent Lipid Panel    Component Value Date/Time   CHOL 173 05/23/2014 0828   TRIG 101.0 05/23/2014 0828   HDL 58.70  05/23/2014 0828   CHOLHDL 3 05/23/2014 0828   VLDL 20.2 05/23/2014 0828   LDLCALC 94 05/23/2014 0828    Physical Exam:    VS:  BP 126/68    Pulse (!) 47    Ht 5\' 2"  (1.575 m)    Wt 177 lb 1.3 oz (80.3 kg)    SpO2 97%    BMI 32.39 kg/m     Wt Readings from Last 3 Encounters:  10/19/19 177 lb 1.3 oz (80.3 kg)  09/20/19 177 lb 6.4 oz (80.5 kg)  09/14/19 177 lb (80.3 kg)     GEN: Patient is in no acute distress HEENT: Normal NECK: No JVD; No carotid bruits LYMPHATICS: No lymphadenopathy CARDIAC: S1 S2 regular, 2/6 systolic murmur at the apex. RESPIRATORY:  Clear  to auscultation without rales, wheezing or rhonchi  ABDOMEN: Soft, non-tender, non-distended MUSCULOSKELETAL:  No edema; No deformity  SKIN: Warm and dry NEUROLOGIC:  Alert and oriented x 3 PSYCHIATRIC:  Normal affect    Signed, Jenean Lindau, MD  10/19/2019 3:50 PM    [facility]

## 2019-10-19 NOTE — Patient Instructions (Signed)
Medication Instructions:  No medication changes. *If you need a refill on your cardiac medications before your next appointment, please call your pharmacy*   Lab Work Your physician recommends that you return for lab work in: next few days that you have a lipid panel done. You need to have labs done when you are fasting.  You can come Monday through Friday 8:30 am to 12:00 pm and 1:15 to 4:30. You do not need to make an appointment as the order has already been placed.   If you have labs (blood work) drawn today and your tests are completely normal, you will receive your results only by: Marland Kitchen MyChart Message (if you have MyChart) OR . A paper copy in the mail If you have any lab test that is abnormal or we need to change your treatment, we will call you to review the results.   Testing/Procedures: Your physician has requested that you have an echocardiogram. Echocardiography is a painless test that uses sound waves to create images of your heart. It provides your doctor with information about the size and shape of your heart and how well your heart's chambers and valves are working. This procedure takes approximately one hour. There are no restrictions for this procedure.    WHY IS MY DOCTOR PRESCRIBING ZIO? The Zio system is proven and trusted by physicians to detect and diagnose irregular heart rhythms -- and has been prescribed to hundreds of thousands of patients.  The FDA has cleared the Zio system to monitor for many different kinds of irregular heart rhythms. In a study, physicians were able to reach a diagnosis 90% of the time with the Zio system1.  You can wear the Zio monitor -- a small, discreet, comfortable patch -- during your normal day-to-day activity, including while you sleep, shower, and exercise, while it records every single heartbeat for analysis.  1Barrett, P., et al. Comparison of 24 Hour Holter Monitoring Versus 14 Day Novel Adhesive Patch Electrocardiographic  Monitoring. [place], 2014.  ZIO VS. HOLTER MONITORING The Zio monitor can be comfortably worn for up to 14 days. Holter monitors can be worn for 24 to 48 hours, limiting the time to record any irregular heart rhythms you may have. Zio is able to capture data for the 51% of patients who have their first symptom-triggered arrhythmia after 48 hours.1  LIVE WITHOUT RESTRICTIONS The Zio ambulatory cardiac monitor is a small, unobtrusive, and water-resistant patch--you might even forget you're wearing it. The Zio monitor records and stores every beat of your heart, whether you're sleeping, working out, or showering. Wear the monitor for 3 days, remove on 10/23/2019.   Follow-Up: At [facility], you and your health needs are our priority.  As part of our continuing mission to provide you with exceptional heart care, we have created designated Provider Care Teams.  These Care Teams include your primary Cardiologist (physician) and Advanced Practice Providers (APPs -  Physician Assistants and Nurse Practitioners) who all work together to provide you with the care you need, when you need it.  We recommend signing up for the patient portal called "MyChart".  Sign up information is provided on this After Visit Summary.  MyChart is used to connect with patients for Virtual Visits (Telemedicine).  Patients are able to view lab/test results, encounter notes, upcoming appointments, etc.  Non-urgent messages can be sent to your provider as well.   To learn more about what you can do with MyChart, go to [email or web address].  Your next appointment:   2 month(s)  The format for your next appointment:   In Person  Provider:   Jyl Heinz, MD   Other Instructions  Echocardiogram An echocardiogram is a procedure that uses painless sound waves (ultrasound) to produce an image of the heart. Images from an echocardiogram can provide important information about:  Signs of coronary  artery disease (CAD).  Aneurysm detection. An aneurysm is a weak or damaged part of an artery wall that bulges out from the normal force of blood pumping through the body.  Heart size and shape. Changes in the size or shape of the heart can be associated with certain conditions, including heart failure, aneurysm, and CAD.  Heart muscle function.  Heart valve function.  Signs of a past heart attack.  Fluid buildup around the heart.  Thickening of the heart muscle.  A tumor or infectious growth around the heart valves. Tell a health care provider about:  Any allergies you have.  All medicines you are taking, including vitamins, herbs, eye drops, creams, and over-the-counter medicines.  Any blood disorders you have.  Any surgeries you have had.  Any medical conditions you have.  Whether you are pregnant or may be pregnant. What are the risks? Generally, this is a safe procedure. However, problems may occur, including:  Allergic reaction to dye (contrast) that may be used during the procedure. What happens before the procedure? No specific preparation is needed. You may eat and drink normally. What happens during the procedure?   An IV tube may be inserted into one of your veins.  You may receive contrast through this tube. A contrast is an injection that improves the quality of the pictures from your heart.  A gel will be applied to your chest.  A wand-like tool (transducer) will be moved over your chest. The gel will help to transmit the sound waves from the transducer.  The sound waves will harmlessly bounce off of your heart to allow the heart images to be captured in real-time motion. The images will be recorded on a computer. The procedure may vary among health care providers and hospitals. What happens after the procedure?  You may return to your normal, everyday life, including diet, activities, and medicines, unless your health care provider tells you not to do  that. Summary  An echocardiogram is a procedure that uses painless sound waves (ultrasound) to produce an image of the heart.  Images from an echocardiogram can provide important information about the size and shape of your heart, heart muscle function, heart valve function, and fluid buildup around your heart.  You do not need to do anything to prepare before this procedure. You may eat and drink normally.  After the echocardiogram is completed, you may return to your normal, everyday life, unless your health care provider tells you not to do that. This information is not intended to replace advice given to you by your health care provider. Make sure you discuss any questions you have with your health care provider. Document Revised: 05/06/2018 Document Reviewed: 02/16/2016 Elsevier Patient Education  [place].

## 2019-10-22 LAB — LIPID PANEL
Chol/HDL Ratio: 3.1 ratio (ref 0.0–4.4)
Cholesterol, Total: 196 mg/dL (ref 100–199)
HDL: 63 mg/dL (ref >39)
LDL Chol Calc (NIH): 117 mg/dL — ABNORMAL HIGH (ref 0–99)
Triglycerides: 90 mg/dL (ref 0–149)
VLDL Cholesterol Cal: 16 mg/dL (ref 5–40)

## 2019-10-23 IMAGING — DX DG LUMBAR SPINE COMPLETE 4+V
5 series · 5 of 5 positions shown · non-contrast
Comparison: CT 04/08/2012

CLINICAL DATA: Mid back pain

EXAM:
LUMBAR SPINE - COMPLETE 4+ VIEW

[l-spine ap]
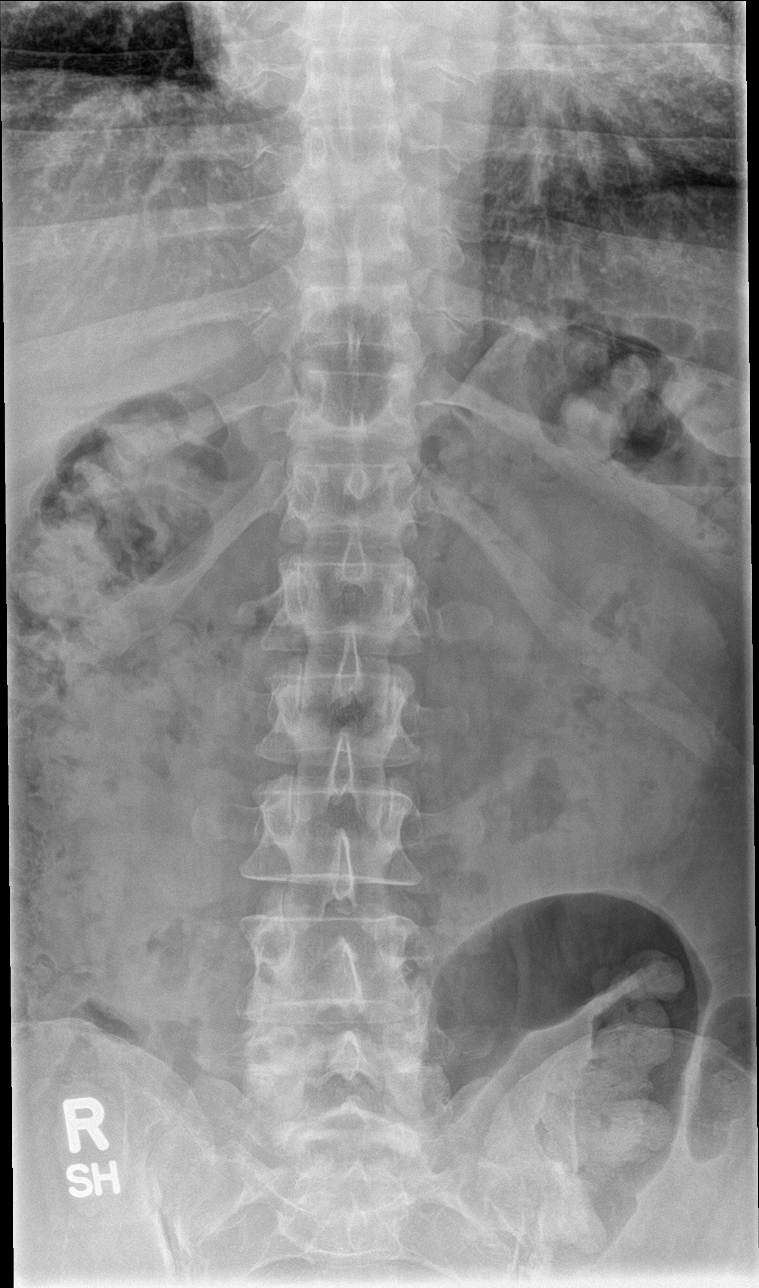

[l-spine obl (1 of 2)]
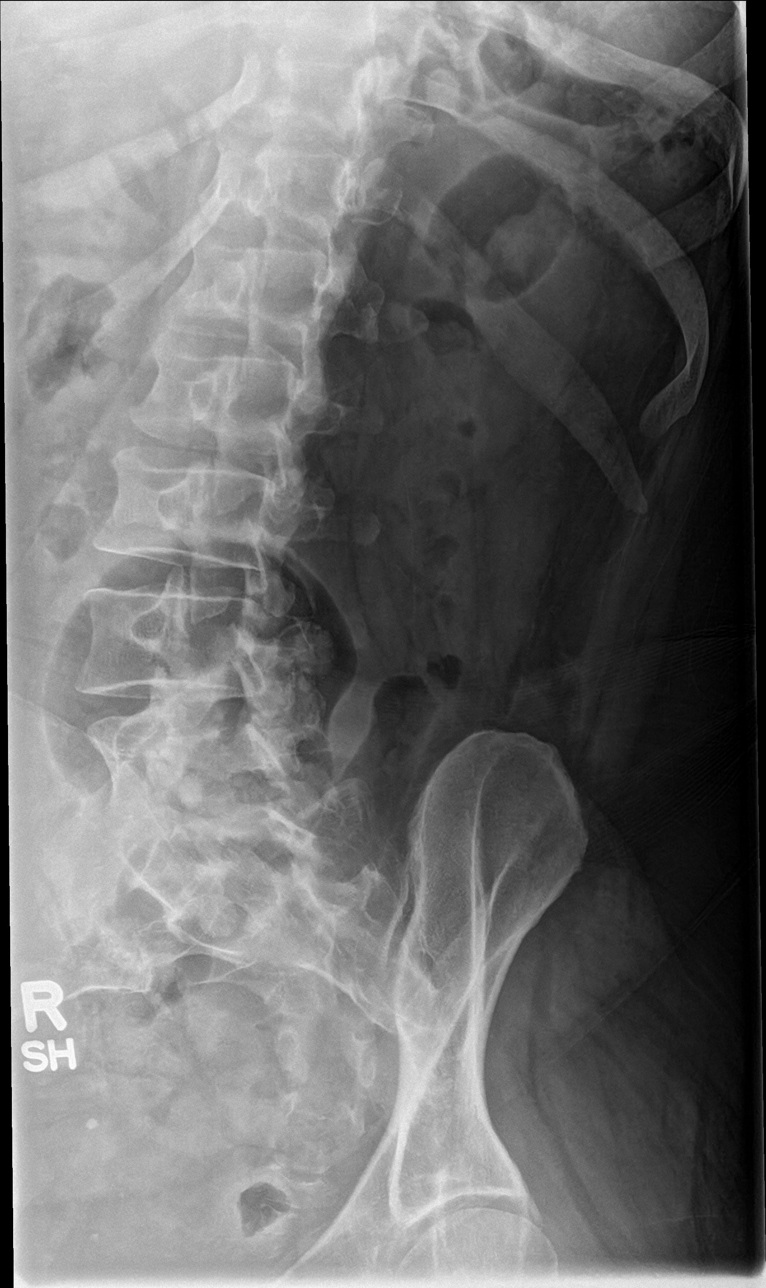

[l-spine obl (2 of 2)]
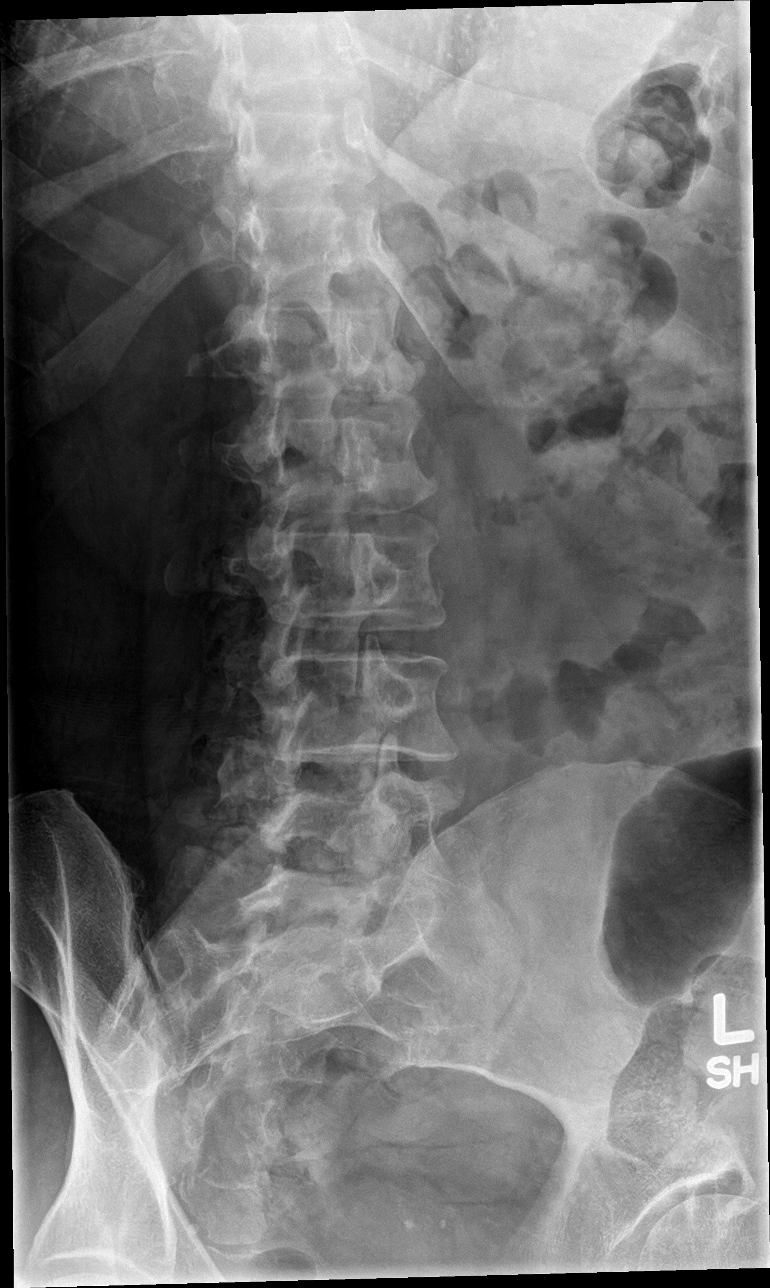

[l-spine lat]
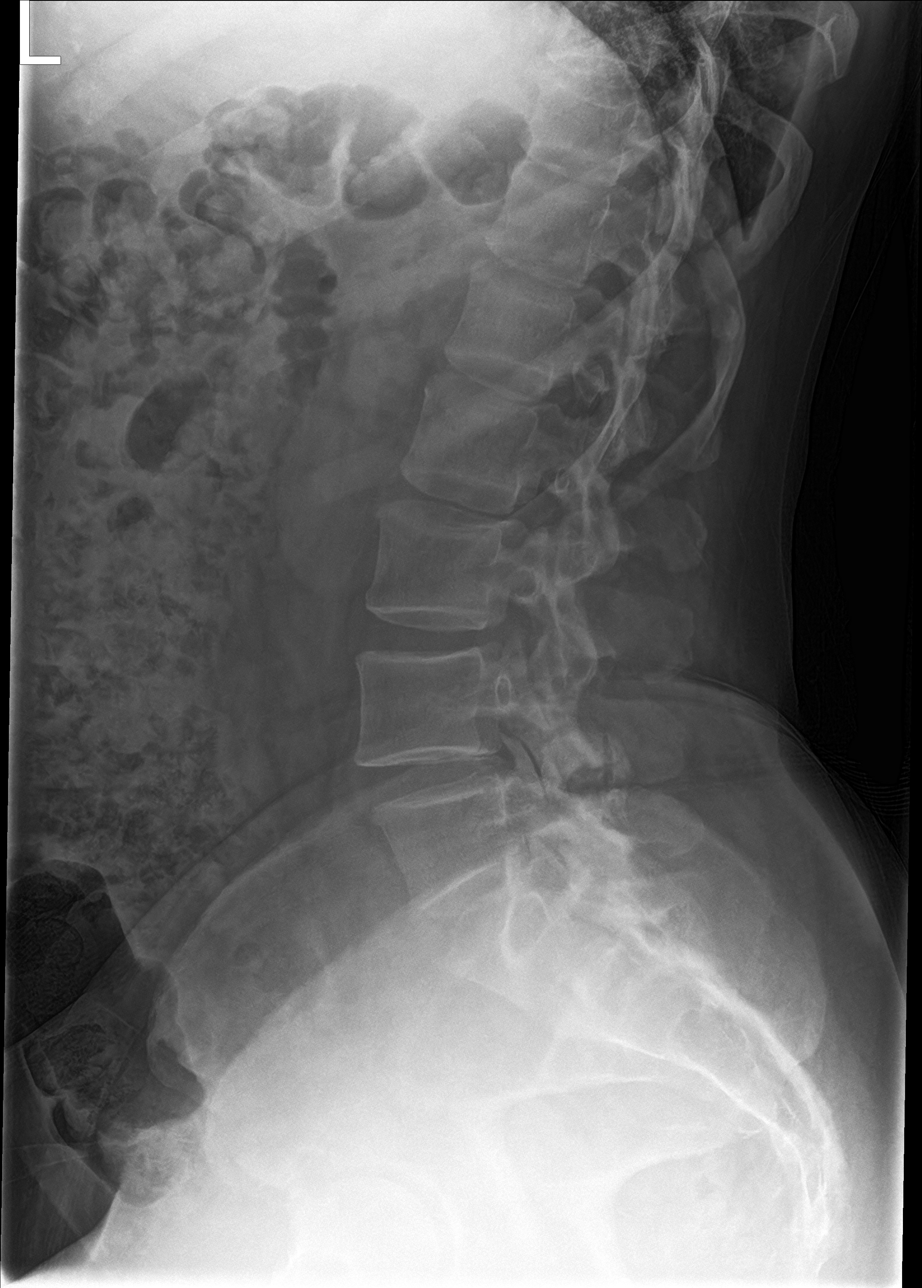

[l-spine spot]
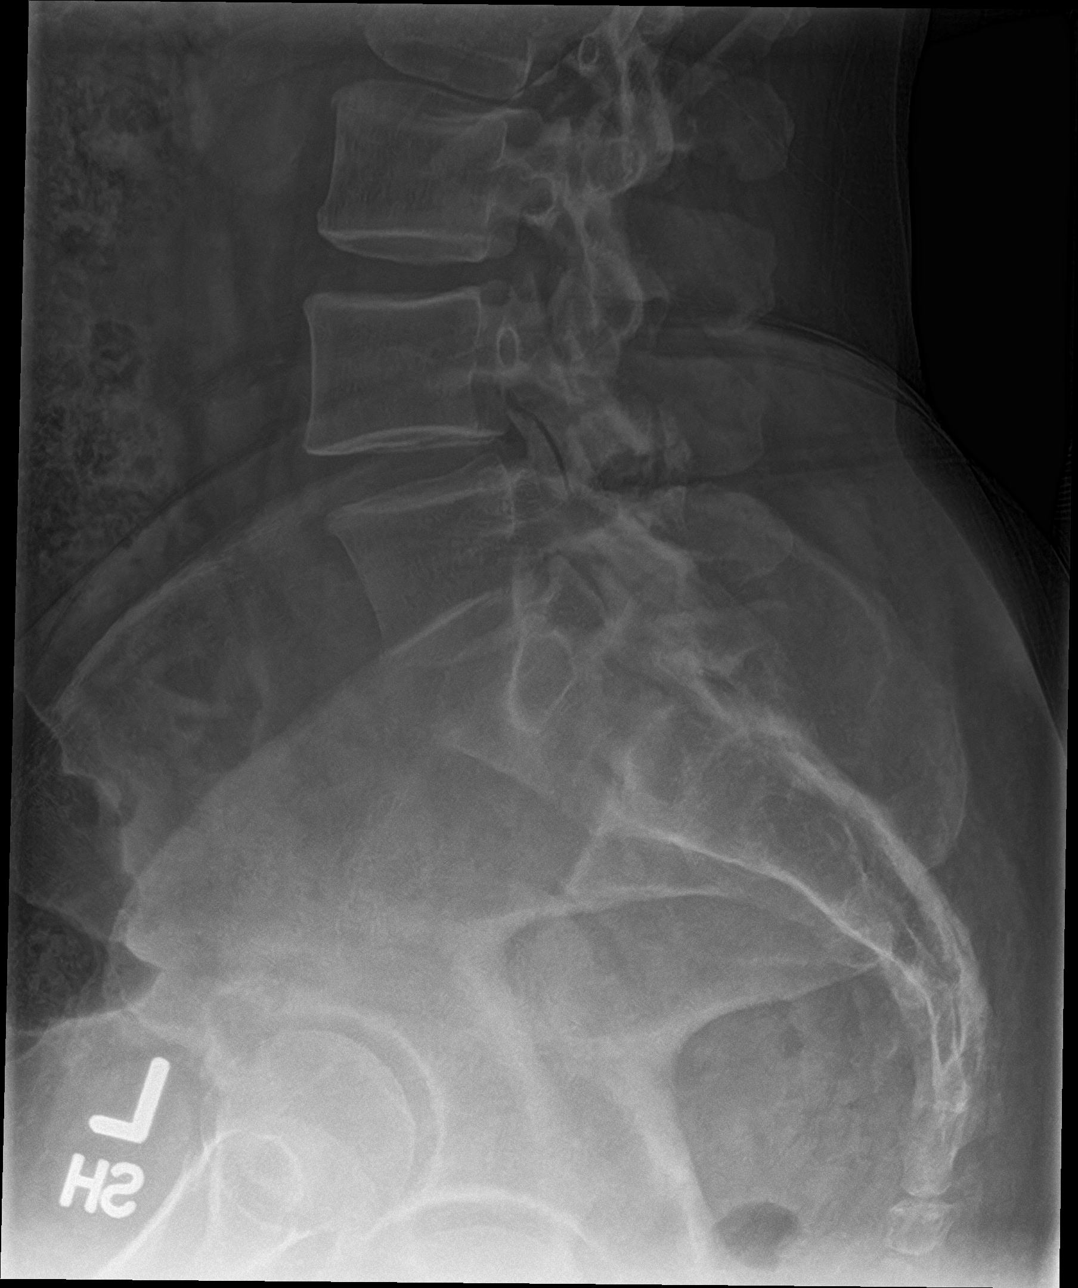

[5 of 5 positions shown; findings below may reference images not displayed]

FINDINGS: Degenerative facet disease at L4-5 and L5-S1. Disc spaces are
maintained. Normal alignment. No fracture. SI joints are symmetric
and unremarkable.
IMPRESSION: Degenerative facet disease in the lower lumbar spine. No acute bony
abnormality.

## 2019-11-01 MED FILL — HYDROCHLOROTHIAZIDE 25 MG T: 25 | 30 days supply | Qty: 30 | Fill #1

## 2019-11-09 ENCOUNTER — Other Ambulatory Visit: Payer: Self-pay

## 2019-11-09 ENCOUNTER — Ambulatory Visit (HOSPITAL_BASED_OUTPATIENT_CLINIC_OR_DEPARTMENT_OTHER)
Admission: RE | Admit: 2019-11-09 | Discharge: 2019-11-09 | Disposition: A | Discharge status: Home / Self Care | Payer: 59 | Source: Ambulatory Visit | Attending: Cardiology | Admitting: Cardiology

## 2019-11-09 DIAGNOSIS — R011 Cardiac murmur, unspecified: Secondary | ICD-10-CM

## 2019-11-09 LAB — ECHOCARDIOGRAM COMPLETE
Area-P 1/2: 2.96 cm2
S' Lateral: 3.55 cm

## 2019-11-10 ENCOUNTER — Telehealth: Payer: Self-pay

## 2019-11-10 NOTE — Telephone Encounter (Signed)
Pt is returning call.  

## 2019-11-10 NOTE — Telephone Encounter (Signed)
-----   Message from Jenean Lindau, MD sent at 11/09/2019 10:57 PM EDT ----- The results of the study is unremarkable. Please inform patient. I will discuss in detail at next appointment. Cc  primary care/referring physician Jenean Lindau, MD 11/09/2019 10:57 PM

## 2019-11-10 NOTE — Telephone Encounter (Signed)
Left message on patients voicemail to please return our call.   

## 2019-11-10 NOTE — Telephone Encounter (Signed)
Spoke with patient regarding results and recommendation.  Patient verbalizes understanding and is agreeable to plan of care. Advised patient to call back with any issues or concerns.  

## 2019-12-07 MED FILL — HYDROCHLOROTHIAZIDE 25 MG T: 25 | 30 days supply | Qty: 30 | Fill #2

## 2019-12-27 DIAGNOSIS — M1990 Unspecified osteoarthritis, unspecified site: Secondary | ICD-10-CM | POA: Insufficient documentation

## 2019-12-27 DIAGNOSIS — D239 Other benign neoplasm of skin, unspecified: Secondary | ICD-10-CM | POA: Insufficient documentation

## 2019-12-27 DIAGNOSIS — I10 Essential (primary) hypertension: Secondary | ICD-10-CM | POA: Insufficient documentation

## 2019-12-28 ENCOUNTER — Ambulatory Visit: Payer: 59 | Admitting: Cardiology

## 2020-01-11 MED FILL — HYDROCHLOROTHIAZIDE 25 MG T: 25 | 30 days supply | Qty: 30 | Fill #3

## 2020-02-14 ENCOUNTER — Ambulatory Visit: Payer: 59 | Admitting: Cardiology

## 2020-02-15 MED FILL — HYDROCHLOROTHIAZIDE 25 MG T: 25 | 30 days supply | Qty: 30 | Fill #4

## 2020-02-16 ENCOUNTER — Other Ambulatory Visit: Payer: Self-pay

## 2020-02-16 DIAGNOSIS — E538 Deficiency of other specified B group vitamins: Secondary | ICD-10-CM

## 2020-02-16 MED ORDER — CYANOCOBALAMIN 1000 MCG/ML IJ SOLN: INTRAMUSCULAR | 0 refills | Status: DC

## 2020-02-16 MED FILL — CYANOCOBALAMIN 1,000 MCG/ML: 1000 | 30 days supply | Qty: 2 | Fill #0

## 2020-02-22 ENCOUNTER — Encounter: Payer: Self-pay | Admitting: Cardiology

## 2020-02-22 ENCOUNTER — Ambulatory Visit: Payer: 59 | Admitting: Cardiology

## 2020-02-22 ENCOUNTER — Other Ambulatory Visit: Payer: Self-pay

## 2020-02-22 VITALS — BP 124/70 | HR 74 | Ht 62.0 in | Wt 184.1 lb

## 2020-02-22 DIAGNOSIS — R002 Palpitations: Secondary | ICD-10-CM | POA: Diagnosis not present

## 2020-02-22 DIAGNOSIS — E782 Mixed hyperlipidemia: Secondary | ICD-10-CM

## 2020-02-22 DIAGNOSIS — I10 Essential (primary) hypertension: Secondary | ICD-10-CM | POA: Diagnosis not present

## 2020-02-22 HISTORY — DX: Mixed hyperlipidemia: E78.2

## 2020-02-22 NOTE — Progress Notes (Signed)
Cardiology Office Note:    Date:  02/22/2020   ID:  Stephanie Castro, DOB 1965-04-07, MRN [id number]  PCP:  Stephanie Pal, DO  Cardiologist:  Stephanie Lindau, MD   Referring MD: Stephanie Castro*    ASSESSMENT:    1. Essential hypertension   2. Palpitations   3. Mixed dyslipidemia    PLAN:    In order of problems listed above:  1. Primary prevention stressed with the patient.  Importance of compliance with diet medication stressed and she vocalized understanding. 2. Essential hypertension: Blood pressure stable.  Lifestyle modification was urged.  She tells me that she cannot exercise on a regular basis because of orthopedic issues involving the hip and the knee. 3. Mixed dyslipidemia: Diet was emphasized.  Lipids were reviewed.  I would like to get calcium scoring for risk stratification for coronary events and she is agreeable.  We will set her up for the same. 4. Obesity: Weight reduction was stressed diet was emphasized.  She promises to do better.Patient will be seen in follow-up appointment in 6 months or earlier if the patient has any concerns   Medication Adjustments/Labs and Tests Ordered: Current medicines are reviewed at length with the patient today.  Concerns regarding medicines are outlined above.  Orders Placed This Encounter  Procedures  . CT CARDIAC SCORING (SELF PAY ONLY)   No orders of the defined types were placed in this encounter.    No chief complaint on file.    History of Present Illness:    Stephanie Castro is a 55 y.o. female.  Patient has past medical history of palpitations, essential hypertension and mixed dyslipidemia.  She has gained weight.  She denies any problems at this time.  No chest pain orthopnea or PND.  Her palpitations have resolved significantly.  At the time of my evaluation, the patient is alert awake oriented and in no distress.  Past Medical History:  Diagnosis Date  . Arthritis    both knees , back and elbows   . BMI 35.0-35.9,adult 05/15/2013  . Breast cancer screening, high risk patient 04/04/2013   Pt states she had mmg 2015 nml results, ordered by Dr  Stephanie Castro, [facility] health care. Results are not in Care everywhere. Mammogram completed 06/12/2015 at [facility] in [place], BI-RADS Category 1   . Cardiac murmur 10/19/2019  . Concern about female breast disease without diagnosis 03/17/2018  . DOE (dyspnea on exertion) 08/08/2015  . Dysplastic nevus    beneath r eye  . Edema of extremities 07/07/2013   Last Assessment & Plan:  Pt takes HCTZ daily.   Last Assessment & Plan:  Formatting of this note might be different from the original. Pt takes HCTZ daily.  . Elevated BP without diagnosis of hypertension 06/15/2015  . Essential hypertension 02/26/2017  . Exercise-induced asthma   . Fatigue 07/06/2013   Last Assessment & Plan:  Will review previous records from previous PCP, pt had thyroid done recently and was wnl. Pt had questions re lupus/MS and how to get tested for these.   Last Assessment & Plan:  Formatting of this note might be different from the original. Will review previous records from previous PCP, pt had thyroid done recently and was wnl. Pt had questions re lupus/MS and how to get   . GERD (gastroesophageal reflux disease)   . Hypertension   . Lateral epicondylitis of both elbows 08/06/2018  . Low  TSH level 04/22/2013  . Myalgia 07/06/2013   Last Assessment & Plan:  Formatting of this note might be different from the original. Will check labs today including sed rate, ANA/RF.  Last Assessment & Plan:  Formatting of this note might be different from the original. Pt would like to be tested for lupus in the future. Has been tested for RA and was negative  . Other and unspecified hyperlipidemia 04/06/2011  . Palpitations 10/19/2019  . Perimenopausal 05/15/2013  . Periodic edema 04/04/2013   Pt reports taking "water pill" for many  years due to feeling "swollen". Historically, took HCTZ but became hypokalemic. She was switched to triamterene-HCTZ with no more hypokalemic episodes.    . Preventative health care 04/04/2013  . Primary localized osteoarthritis of right knee 10/13/2014  . Squamous cell carcinoma, face 02/24/2014  . Tear of medial meniscus of right knee 10/13/2014  . Vitamin B12 deficiency 05/15/2013   Last Assessment & Plan:  Formatting of this note might be different from the original. Recheck level today, may need to restart monthly Vit B12 shots  . Vitamin D deficiency 02/24/2014  . Weight gain 04/24/2015    Past Surgical History:  Procedure Laterality Date  . ABDOMINAL HYSTERECTOMY  2007  . KNEE ARTHROSCOPY WITH MEDIAL MENISECTOMY Right 10/13/2014   Procedure: RIGHT KNEE ARTHROSCOPY WITH PARTIAL MEDIAL MENISECTOMY AND CHONDROPLASTY ;  Surgeon: Stephanie Bond, MD;  Location: [facility];  Service: Orthopedics;  Laterality: Right;    Current Medications: Current Meds  Medication Sig  . cyanocobalamin (,VITAMIN B-12,) 1000 MCG/ML injection Inject 50mL into the skin every 2 weeks  . Diclofenac Sodium (PENNSAID) 2 % SOLN Place 1 application onto the skin 2 (two) times daily.  . hydrochlorothiazide (HYDRODIURIL) 25 MG tablet TAKE 1 TABLET BY MOUTH ONCE DAILY **NEED OFFICE VISIT FOR MORE REFILLS**     Allergies:   Penicillins   Social History   Socioeconomic History  . Marital status: Married    Spouse name: Not on file  . Number of children: Not on file  . Years of education: Not on file  . Highest education level: Not on file  Occupational History  . Not on file  Tobacco Use  . Smoking status: Former Smoker    Packs/day: 1.00    Years: 20.00    Pack years: 20.00    Types: Cigarettes    Quit date: 2005    Years since quitting: 17.0  . Smokeless tobacco: Never Used  Vaping Use  . Vaping Use: Never used  Substance and Sexual Activity  . Alcohol use: Yes    Alcohol/week: 0.0 standard  drinks    Comment: social  . Drug use: No  . Sexual activity: Yes    Birth control/protection: Surgical  Other Topics Concern  . Not on file  Social History Narrative   Married to Stephanie Castro.    Social Determinants of Health   Financial Resource Strain: Not on file  Food Insecurity: Not on file  Transportation Needs: Not on file  Physical Activity: Not on file  Stress: Not on file  Social Connections: Not on file     Family History: The patient's family history includes Breast cancer in her maternal aunt and maternal grandmother; Breast cancer (age of onset: 85) in her mother; COPD in her father; Colon cancer in her maternal uncle; Diabetes in her maternal grandfather, maternal grandmother, and maternal uncle; Heart attack in her father, sister, and sister; Hyperlipidemia in her maternal aunt, maternal grandmother,  mother, and sister; Hypertension in her maternal uncle and sister. There is no history of Sudden death.  ROS:   Please see the history of present illness.    All other systems reviewed and are negative.  EKGs/Labs/Other Studies Reviewed:    The following studies were reviewed today: EVENT MONITOR REPORT:   Patient was monitored from 10/19/2019 to 10/23/2019. Indication:                    Palpitations Ordering physician:  Stephanie Lindau, MD  Referring physician:        Jenean Lindau, MD    Baseline rhythm: Sinus  Minimum heart rate: 38 BPM.  Average heart rate: 57 BPM.  Maximal heart rate 134 BPM.  Atrial arrhythmia: Occasional PACs  Ventricular arrhythmia: Occasional PVCs with ventricular bigeminy at times  Conduction abnormality: None significant  Symptoms: Patient complained of skipped beats and palpitations   Conclusion:  Mildly abnormal and largely unremarkable event monitor.  Symptoms did not correlate with any significant findings.  Interpreting  cardiologist: Stephanie Lindau, MD  Date: 10/28/2019 10:55 AM     Recent  Labs: 09/14/2019: ALT 9; BUN 9; Creatinine, Ser 0.72; Hemoglobin 12.6; Magnesium 2.0; Platelets 171.0; Potassium 4.1; Sodium 138; TSH 3.07  Recent Lipid Panel    Component Value Date/Time   CHOL 196 10/21/2019 0804   TRIG 90 10/21/2019 0804   HDL 63 10/21/2019 0804   CHOLHDL 3.1 10/21/2019 0804   CHOLHDL 3 05/23/2014 0828   VLDL 20.2 05/23/2014 0828   LDLCALC 117 (H) 10/21/2019 0804    Physical Exam:    VS:  BP 124/70   Pulse 74   Ht 5\' 2"  (1.575 m)   Wt 184 lb 1.3 oz (83.5 kg)   SpO2 99%   BMI 33.67 kg/m     Wt Readings from Last 3 Encounters:  02/22/20 184 lb 1.3 oz (83.5 kg)  10/19/19 177 lb 1.3 oz (80.3 kg)  09/20/19 177 lb 6.4 oz (80.5 kg)     GEN: Patient is in no acute distress HEENT: Normal NECK: No JVD; No carotid bruits LYMPHATICS: No lymphadenopathy CARDIAC: Hear sounds regular, 2/6 systolic murmur at the apex. RESPIRATORY:  Clear to auscultation without rales, wheezing or rhonchi  ABDOMEN: Soft, non-tender, non-distended MUSCULOSKELETAL:  No edema; No deformity  SKIN: Warm and dry NEUROLOGIC:  Alert and oriented x 3 PSYCHIATRIC:  Normal affect   Signed, Stephanie Lindau, MD  02/22/2020 11:31 AM    [place]

## 2020-02-22 NOTE — Patient Instructions (Signed)
Medication Instructions:  No medication changes. *If you need a refill on your cardiac medications before your next appointment, please call your pharmacy*   Lab Work: None ordered If you have labs (blood work) drawn today and your tests are completely normal, you will receive your results only by: MyChart Message (if you have MyChart) OR A paper copy in the mail If you have any lab test that is abnormal or we need to change your treatment, we will call you to review the results.   Testing/Procedures:  We will order CT coronary calcium score. It will cost $99.00 and is not covered by insurance.  Please call [phone] to schedule.   [facility]  [address]  [place], [place] [address]    Follow-Up: At [facility], you and your health needs are our priority.  As part of our continuing mission to provide you with exceptional heart care, we have created designated Provider Care Teams.  These Care Teams include your primary Cardiologist (physician) and Advanced Practice Providers (APPs -  Physician Assistants and Nurse Practitioners) who all work together to provide you with the care you need, when you need it.  We recommend signing up for the patient portal called "MyChart".  Sign up information is provided on this After Visit Summary.  MyChart is used to connect with patients for Virtual Visits (Telemedicine).  Patients are able to view lab/test results, encounter notes, upcoming appointments, etc.  Non-urgent messages can be sent to your provider as well.   To learn more about what you can do with MyChart, go to [email or web address].    Your next appointment:   6 month(s)  The format for your next appointment:   In Person  Provider:   Rajan Revankar, MD   Other Instructions  Coronary Calcium Scan A coronary calcium scan is an imaging test used to look for deposits of plaque in the inner lining of the blood vessels of the heart (coronary arteries).  Plaque is made up of calcium, protein, and fatty substances. These deposits of plaque can partly clog and narrow the coronary arteries without producing any symptoms or warning signs. This puts a person at risk for a heart attack. This test is recommended for people who are at moderate risk for heart disease. The test can find plaque deposits before symptoms develop. Tell a health care provider about: Any allergies you have. All medicines you are taking, including vitamins, herbs, eye drops, creams, and over-the-counter medicines. Any problems you or family members have had with anesthetic medicines. Any blood disorders you have. Any surgeries you have had. Any medical conditions you have. Whether you are pregnant or may be pregnant. What are the risks? Generally, this is a safe procedure. However, problems may occur, including: Harm to a pregnant woman and her unborn baby. This test involves the use of radiation. Radiation exposure can be dangerous to a pregnant woman and her unborn baby. If you are pregnant or think you may be pregnant, you should not have this procedure done. Slight increase in the risk of cancer. This is because of the radiation involved in the test. What happens before the procedure? Ask your health care provider for any specific instructions on how to prepare for this procedure. You may be asked to avoid products that contain caffeine, tobacco, or nicotine for 4 hours before the procedure. What happens during the procedure? You will undress and remove any jewelry from your neck or chest. You will put on   a hospital gown. Sticky electrodes will be placed on your chest. The electrodes will be connected to an electrocardiogram (ECG) machine to record a tracing of the electrical activity of your heart. You will lie down on a curved bed that is attached to the CT scanner. You may be given medicine to slow down your heart rate so that clear pictures can be created. You will be  moved into the CT scanner, and the CT scanner will take pictures of your heart. During this time, you will be asked to lie still and hold your breath for 2-3 seconds at a time while each picture of your heart is being taken. The procedure may vary among health care providers and hospitals.    What happens after the procedure? You can get dressed. You can return to your normal activities. It is up to you to get the results of your procedure. Ask your health care provider, or the department that is doing the procedure, when your results will be ready. Summary A coronary calcium scan is an imaging test used to look for deposits of plaque in the inner lining of the blood vessels of the heart (coronary arteries). Plaque is made up of calcium, protein, and fatty substances. Generally, this is a safe procedure. Tell your health care provider if you are pregnant or may be pregnant. Ask your health care provider for any specific instructions on how to prepare for this procedure. A CT scanner will take pictures of your heart. You can return to your normal activities after the scan is done. This information is not intended to replace advice given to you by your health care provider. Make sure you discuss any questions you have with your health care provider. Document Revised: 08/03/2018 Document Reviewed: 08/03/2018 Elsevier Patient Education  2021 Elsevier Inc.  

## 2020-03-05 ENCOUNTER — Ambulatory Visit (INDEPENDENT_AMBULATORY_CARE_PROVIDER_SITE_OTHER)
Admission: RE | Admit: 2020-03-05 | Discharge: 2020-03-05 | Disposition: A | Discharge status: Home / Self Care | Payer: Self-pay | Source: Ambulatory Visit | Attending: Cardiology | Admitting: Cardiology

## 2020-03-05 ENCOUNTER — Other Ambulatory Visit: Payer: Self-pay

## 2020-03-05 DIAGNOSIS — I10 Essential (primary) hypertension: Secondary | ICD-10-CM

## 2020-03-06 MED ORDER — ASPIRIN EC 81 MG PO TBEC: 81.0000 mg | DELAYED_RELEASE_TABLET | Freq: Every day | ORAL | 3 refills | Status: DC

## 2020-03-06 NOTE — Addendum Note (Signed)
Addended by: Truddie Hidden on: 03/06/2020 11:13 AM   Modules accepted: Orders

## 2020-03-08 ENCOUNTER — Other Ambulatory Visit: Payer: Self-pay | Admitting: Cardiology

## 2020-03-08 LAB — HEPATIC FUNCTION PANEL
ALT: 10 IU/L (ref 0–32)
AST: 14 IU/L (ref 0–40)
Albumin: 4.2 g/dL (ref 3.8–4.9)
Alkaline Phosphatase: 81 IU/L (ref 44–121)
Bilirubin Total: 0.4 mg/dL (ref 0.0–1.2)
Bilirubin, Direct: 0.11 mg/dL (ref 0.00–0.40)
Total Protein: 6.3 g/dL (ref 6.0–8.5)

## 2020-03-08 LAB — BASIC METABOLIC PANEL
BUN/Creatinine Ratio: 15 (ref 9–23)
BUN: 12 mg/dL (ref 6–24)
CO2: 23 mmol/L (ref 20–29)
Calcium: 9.1 mg/dL (ref 8.7–10.2)
Chloride: 104 mmol/L (ref 96–106)
Creatinine, Ser: 0.78 mg/dL (ref 0.57–1.00)
GFR calc Af Amer: 99 mL/min/{1.73_m2} (ref >59)
GFR calc non Af Amer: 86 mL/min/{1.73_m2} (ref >59)
Glucose: 85 mg/dL (ref 65–99)
Potassium: 3.7 mmol/L (ref 3.5–5.2)
Sodium: 140 mmol/L (ref 134–144)

## 2020-03-08 LAB — LIPID PANEL
Chol/HDL Ratio: 3.1 ratio (ref 0.0–4.4)
Cholesterol, Total: 185 mg/dL (ref 100–199)
HDL: 60 mg/dL (ref >39)
LDL Chol Calc (NIH): 101 mg/dL — ABNORMAL HIGH (ref 0–99)
Triglycerides: 136 mg/dL (ref 0–149)
VLDL Cholesterol Cal: 24 mg/dL (ref 5–40)

## 2020-03-08 MED ORDER — ROSUVASTATIN CALCIUM 10 MG PO TABS: 10.0000 mg | ORAL_TABLET | Freq: Every day | ORAL | 3 refills | Status: DC

## 2020-03-08 MED FILL — ROSUVASTATIN CALCIUM 10 MG: 10 | 30 days supply | Qty: 30 | Fill #0

## 2020-03-08 NOTE — Addendum Note (Signed)
Addended by: Truddie Hidden on: 03/08/2020 05:00 PM   Modules accepted: Orders

## 2020-03-21 MED FILL — HYDROCHLOROTHIAZIDE 25 MG T: 25 | 30 days supply | Qty: 30 | Fill #5

## 2020-04-13 ENCOUNTER — Ambulatory Visit: Payer: 59 | Admitting: Family Medicine

## 2020-04-13 ENCOUNTER — Other Ambulatory Visit: Payer: Self-pay

## 2020-04-13 ENCOUNTER — Encounter: Payer: Self-pay | Admitting: Family Medicine

## 2020-04-13 ENCOUNTER — Other Ambulatory Visit: Payer: Self-pay | Admitting: Family Medicine

## 2020-04-13 VITALS — BP 132/70 | HR 51 | Temp 97.5°F | Resp 16 | Wt 192.2 lb

## 2020-04-13 DIAGNOSIS — I10 Essential (primary) hypertension: Secondary | ICD-10-CM

## 2020-04-13 DIAGNOSIS — G8929 Other chronic pain: Secondary | ICD-10-CM

## 2020-04-13 DIAGNOSIS — M25561 Pain in right knee: Secondary | ICD-10-CM

## 2020-04-13 HISTORY — DX: Other chronic pain: G89.29

## 2020-04-13 MED ORDER — MELOXICAM 7.5 MG PO TABS: 7.5000 mg | ORAL_TABLET | Freq: Every day | ORAL | 2 refills | Status: DC

## 2020-04-13 MED ORDER — HYDROCHLOROTHIAZIDE 25 MG PO TABS: ORAL_TABLET | ORAL | 6 refills | Status: DC

## 2020-04-13 NOTE — Progress Notes (Signed)
Chief Complaint  Patient presents with  . right knee pain    Subjective: Patient is a 55 y.o. female here for chronic R knee pain.  Patient has a several year history of right knee pain that is steadily worsening.  She sees rheumatology for rheumatoid arthritis.  She had a film of her right knee done 1 year ago that showed severe arthritis.  She has intermittent swelling.  She has failed physical therapy, home stretches/exercises, steroid injections, oral medication.  She tries not to take too much ibuprofen which minimally helps.  She does not wish to have a replacement as she does not like the idea of having metal in her body.  Past Medical History:  Diagnosis Date  . Arthritis    both knees , back and elbows  . BMI 35.0-35.9,adult 05/15/2013  . Breast cancer screening, high risk patient 04/04/2013   Pt states she had mmg 2015 nml results, ordered by Dr  Ruben Gottron, [facility] health care. Results are not in Care everywhere. Mammogram completed 06/12/2015 at [facility] in [place], BI-RADS Category 1   . Cardiac murmur 10/19/2019  . Concern about female breast disease without diagnosis 03/17/2018  . DOE (dyspnea on exertion) 08/08/2015  . Dysplastic nevus    beneath r eye  . Edema of extremities 07/07/2013   Last Assessment & Plan:  Pt takes HCTZ daily.   Last Assessment & Plan:  Formatting of this note might be different from the original. Pt takes HCTZ daily.  . Elevated BP without diagnosis of hypertension 06/15/2015  . Essential hypertension 02/26/2017  . Exercise-induced asthma   . Fatigue 07/06/2013   Last Assessment & Plan:  Will review previous records from previous PCP, pt had thyroid done recently and was wnl. Pt had questions re lupus/MS and how to get tested for these.   Last Assessment & Plan:  Formatting of this note might be different from the original. Will review previous records from previous PCP, pt had  thyroid done recently and was wnl. Pt had questions re lupus/MS and how to get   . GERD (gastroesophageal reflux disease)   . Hypertension   . Lateral epicondylitis of both elbows 08/06/2018  . Low TSH level 04/22/2013  . Myalgia 07/06/2013   Last Assessment & Plan:  Formatting of this note might be different from the original. Will check labs today including sed rate, ANA/RF.  Last Assessment & Plan:  Formatting of this note might be different from the original. Pt would like to be tested for lupus in the future. Has been tested for RA and was negative  . Other and unspecified hyperlipidemia 04/06/2011  . Palpitations 10/19/2019  . Perimenopausal 05/15/2013  . Periodic edema 04/04/2013   Pt reports taking "water pill" for many years due to feeling "swollen". Historically, took HCTZ but became hypokalemic. She was switched to triamterene-HCTZ with no more hypokalemic episodes.    . Preventative health care 04/04/2013  . Primary localized osteoarthritis of right knee 10/13/2014  . Squamous cell carcinoma, face 02/24/2014  . Tear of medial meniscus of right knee 10/13/2014  . Vitamin B12 deficiency 05/15/2013   Last Assessment & Plan:  Formatting of this note might be different from the original. Recheck level today, may need to restart monthly Vit B12 shots  . Vitamin D deficiency 02/24/2014  . Weight gain 04/24/2015    Objective: BP 132/70   Pulse (!) 51   Temp (!) 97.5  F (36.4 C)   Resp 16   Wt 192 lb 3.2 oz (87.2 kg)   SpO2 99%   BMI 35.15 kg/m  General: Awake, appears stated age MSK: + Effusion, no erythema or excessive warmth, no tenderness to palpation, negative Stines, Lachman's, varus/valgus stress Lungs: No accessory muscle use Psych: Age appropriate judgment and insight, normal affect and mood  Assessment and Plan: Chronic pain of right knee - Plan: Ambulatory referral to Sports Medicine, meloxicam (MOBIC) 7.5 MG tablet  Essential hypertension - Plan: hydrochlorothiazide  (HYDRODIURIL) 25 MG tablet  Status: Chronic, worsening.  Severe arthritis noted on imaging.  She declines surgery, will refer to Dr. Tamala Julian with sports medicine to discuss viscosupplementation and PRP.  We will also start a low-dose meloxicam for Cox 2 specific inhibition, she can use this as needed. The patient voiced understanding and agreement to the plan.  [place], DO 04/13/20  12:16 PM

## 2020-04-13 NOTE — Patient Instructions (Signed)
If you do not hear anything about your referral in the next 1-2 weeks, call our office and ask for an update.  Keep the diet clean and stay active.  Let us know if you need anything.

## 2020-04-30 ENCOUNTER — Encounter: Payer: Self-pay | Admitting: Family Medicine

## 2020-04-30 ENCOUNTER — Telehealth (INDEPENDENT_AMBULATORY_CARE_PROVIDER_SITE_OTHER): Payer: 59 | Admitting: Family Medicine

## 2020-04-30 ENCOUNTER — Other Ambulatory Visit (HOSPITAL_BASED_OUTPATIENT_CLINIC_OR_DEPARTMENT_OTHER): Payer: Self-pay

## 2020-04-30 ENCOUNTER — Other Ambulatory Visit: Payer: Self-pay

## 2020-04-30 VITALS — Ht 62.0 in | Wt 187.0 lb

## 2020-04-30 DIAGNOSIS — E669 Obesity, unspecified: Secondary | ICD-10-CM | POA: Diagnosis not present

## 2020-04-30 MED ORDER — PHENTERMINE HCL 37.5 MG PO TABS
37.5000 mg | ORAL_TABLET | Freq: Every day | ORAL | 0 refills | Status: DC
  Filled 2020-04-30: qty 30, 30d supply, fill #0

## 2020-04-30 NOTE — Progress Notes (Signed)
Chief Complaint  Patient presents with  . Medication Refill    Wants a weight loss medication    Subjective: Patient is a 55 y.o. female here for obesity. Due to COVID-19 pandemic, we are interacting via web portal for an electronic face-to-face visit. I verified patient's ID using 2 identifiers. Patient agreed to proceed with visit via this method. Patient is in a parked car, I am at office. Patient and I are present for visit.   Patient has been struggling with weight loss.  She has a history of knee osteoarthritis and is following with orthopedic team.  She tries to exercise but her knee pain will flare.  She tries to stay active with weight watchers and eating healthy.  She has been on phentermine in the past and is requesting a prescription.  She tolerated well in the past.  Past Medical History:  Diagnosis Date  . Arthritis    both knees , back and elbows  . BMI 35.0-35.9,adult 05/15/2013  . Breast cancer screening, high risk patient 04/04/2013   Pt states she had mmg 2015 nml results, ordered by Dr  Ruben Gottron, [facility] health care. Results are not in Care everywhere. Mammogram completed 06/12/2015 at [facility] in [place], BI-RADS Category 1   . Cardiac murmur 10/19/2019  . Concern about female breast disease without diagnosis 03/17/2018  . DOE (dyspnea on exertion) 08/08/2015  . Dysplastic nevus    beneath r eye  . Edema of extremities 07/07/2013   Last Assessment & Plan:  Pt takes HCTZ daily.   Last Assessment & Plan:  Formatting of this note might be different from the original. Pt takes HCTZ daily.  . Essential hypertension 02/26/2017  . Exercise-induced asthma   . Fatigue 07/06/2013   Last Assessment & Plan:  Will review previous records from previous PCP, pt had thyroid done recently and was wnl. Pt had questions re lupus/MS and how to get tested for these.   Last Assessment & Plan:  Formatting of this note might  be different from the original. Will review previous records from previous PCP, pt had thyroid done recently and was wnl. Pt had questions re lupus/MS and how to get   . GERD (gastroesophageal reflux disease)   . Lateral epicondylitis of both elbows 08/06/2018  . Low TSH level 04/22/2013  . Myalgia 07/06/2013   Last Assessment & Plan:  Formatting of this note might be different from the original. Will check labs today including sed rate, ANA/RF.  Last Assessment & Plan:  Formatting of this note might be different from the original. Pt would like to be tested for lupus in the future. Has been tested for RA and was negative  . Other and unspecified hyperlipidemia 04/06/2011  . Perimenopausal 05/15/2013  . Periodic edema 04/04/2013   Pt reports taking "water pill" for many years due to feeling "swollen". Historically, took HCTZ but became hypokalemic. She was switched to triamterene-HCTZ with no more hypokalemic episodes.    . Primary localized osteoarthritis of right knee 10/13/2014  . Squamous cell carcinoma, face 02/24/2014  . Tear of medial meniscus of right knee 10/13/2014  . Vitamin B12 deficiency 05/15/2013   Last Assessment & Plan:  Formatting of this note might be different from the original. Recheck level today, may need to restart monthly Vit B12 shots  . Vitamin D deficiency 02/24/2014  . Weight gain 04/24/2015    Objective: Ht 5\' 2"  (1.575 m)  Wt 187 lb (84.8 kg)   BMI 34.20 kg/m  No conversational dyspnea Age appropriate judgment and insight Nml affect and mood  Assessment and Plan: Obesity (BMI 30-39.9) - Plan: phentermine (ADIPEX-P) 37.5 MG tablet  Status: Chronic, uncontrolled.  Counseled on above.  Counseled on diet and exercise.  I will see her in 1 month recheck.  Discussed not going beyond 90 days. The patient voiced understanding and agreement to the plan.  [place], DO 04/30/20  2:14 PM

## 2020-05-01 NOTE — Progress Notes (Signed)
[place] Phone: [phone] Subjective:   [name], am serving as a scribe for Dr. Hulan Saas. This visit occurred during the SARS-CoV-2 public health emergency.  Safety protocols were in place, including screening questions prior to the visit, additional usage of staff PPE, and extensive cleaning of exam room while observing appropriate contact time as indicated for disinfecting solutions.   I'm seeing this patient by the request  of:  [name], DO  CC: Right knee pain  FMB:[id number]  Stephanie Castro is a 55 y.o. female coming in with complaint of right knee pain. Patient states that in 2016 she has surgery for torn meniscus. Pain throughout the joint. Would like to exercise more but is in pain and exercise also makes her hip hurt due to compensation. Has tried cortisone injections but they are not long lasting.  Patient has gained 15 pounds and knows that that is contributing to some of the pain but is unable to workout secondary to the discomfort.  Right knee xray 03/20/2019 Severe medial compartment narrowing with medial osteophytes were noted.   No chondrocalcinosis was noted. Severe patellofemoral narrowing was  noted.   Impression: These findings are consistent with severe osteoarthritis and  severe chondromalacia patella of the knee.  Patient had similar x-rays done on the left side that showed moderate osteoarthritic changes.    Past Medical History:  Diagnosis Date  . Arthritis    both knees , back and elbows  . BMI 35.0-35.9,adult 05/15/2013  . Breast cancer screening, high risk patient 04/04/2013   Pt states she had mmg 2015 nml results, ordered by Dr  Ruben Gottron, [facility] health care. Results are not in Care everywhere. Mammogram completed 06/12/2015 at [facility] in [place], BI-RADS Category 1   . Cardiac  murmur 10/19/2019  . Concern about female breast disease without diagnosis 03/17/2018  . DOE (dyspnea on exertion) 08/08/2015  . Dysplastic nevus    beneath r eye  . Edema of extremities 07/07/2013   Last Assessment & Plan:  Pt takes HCTZ daily.   Last Assessment & Plan:  Formatting of this note might be different from the original. Pt takes HCTZ daily.  . Essential hypertension 02/26/2017  . Exercise-induced asthma   . Fatigue 07/06/2013   Last Assessment & Plan:  Will review previous records from previous PCP, pt had thyroid done recently and was wnl. Pt had questions re lupus/MS and how to get tested for these.   Last Assessment & Plan:  Formatting of this note might be different from the original. Will review previous records from previous PCP, pt had thyroid done recently and was wnl. Pt had questions re lupus/MS and how to get   . GERD (gastroesophageal reflux disease)   . Lateral epicondylitis of both elbows 08/06/2018  . Low TSH level 04/22/2013  . Myalgia 07/06/2013   Last Assessment & Plan:  Formatting of this note might be different from the original. Will check labs today including sed rate, ANA/RF.  Last Assessment & Plan:  Formatting of this note might be different from the original. Pt would like to be tested for lupus in the future. Has been tested for RA and was negative  . Other and unspecified hyperlipidemia 04/06/2011  . Perimenopausal 05/15/2013  . Periodic edema 04/04/2013   Pt reports taking "water pill" for many years due to feeling "swollen". Historically,  took HCTZ but became hypokalemic. She was switched to triamterene-HCTZ with no more hypokalemic episodes.    . Primary localized osteoarthritis of right knee 10/13/2014  . Squamous cell carcinoma, face 02/24/2014  . Tear of medial meniscus of right knee 10/13/2014  . Vitamin B12 deficiency 05/15/2013   Last Assessment & Plan:  Formatting of this note might be different from the original. Recheck level today, may need to restart  monthly Vit B12 shots  . Vitamin D deficiency 02/24/2014  . Weight gain 04/24/2015   Past Surgical History:  Procedure Laterality Date  . ABDOMINAL HYSTERECTOMY  2007  . KNEE ARTHROSCOPY WITH MEDIAL MENISECTOMY Right 10/13/2014   Procedure: RIGHT KNEE ARTHROSCOPY WITH PARTIAL MEDIAL MENISECTOMY AND CHONDROPLASTY ;  Surgeon: Marchia Bond, MD;  Location: [place];  Service: Orthopedics;  Laterality: Right;   Social History   Socioeconomic History  . Marital status: Married    Spouse name: Not on file  . Number of children: Not on file  . Years of education: Not on file  . Highest education level: Not on file  Occupational History  . Not on file  Tobacco Use  . Smoking status: Former Smoker    Packs/day: 1.00    Years: 20.00    Pack years: 20.00    Types: Cigarettes    Quit date: 2005    Years since quitting: 17.2  . Smokeless tobacco: Never Used  Vaping Use  . Vaping Use: Never used  Substance and Sexual Activity  . Alcohol use: Yes    Alcohol/week: 0.0 standard drinks    Comment: social  . Drug use: No  . Sexual activity: Yes    Birth control/protection: Surgical  Other Topics Concern  . Not on file  Social History Narrative   Married to [place].    Social Determinants of Health   Financial Resource Strain: Not on file  Food Insecurity: Not on file  Transportation Needs: Not on file  Physical Activity: Not on file  Stress: Not on file  Social Connections: Not on file   Allergies  Allergen Reactions  . Penicillins    Family History  Problem Relation Age of Onset  . Hyperlipidemia Mother   . Breast cancer Mother 64       breast  . Heart attack Father   . COPD Father   . Heart attack Sister   . Hyperlipidemia Sister   . Hypertension Sister   . Hyperlipidemia Maternal Aunt   . Breast cancer Maternal Aunt        breast  . Hypertension Maternal Uncle   . Diabetes Maternal Uncle   . Colon cancer Maternal Uncle        colon  .  Hyperlipidemia Maternal Grandmother   . Diabetes Maternal Grandmother   . Breast cancer Maternal Grandmother        breast  . Diabetes Maternal Grandfather   . Heart attack Sister   . Sudden death Neg Hx      Current Outpatient Medications (Cardiovascular):  .  hydrochlorothiazide (HYDRODIURIL) 25 MG tablet, TAKE 1 TABLET BY MOUTH ONCE DAILY .  rosuvastatin (CRESTOR) 10 MG tablet, TAKE 1 TABLET BY MOUTH ONCE DAILY   Current Outpatient Medications (Analgesics):  .  aspirin EC 81 MG tablet, Take 1 tablet (81 mg total) by mouth daily. Swallow whole. .  meloxicam (MOBIC) 7.5 MG tablet, TAKE 1 TABLET BY MOUTH ONCE DAILY  Current Outpatient Medications (Hematological):  .  cyanocobalamin (,VITAMIN B-12,) 1000 MCG/ML  injection, INJECT 1ML INTO THE SKIN EVERY 2 WEEKS  Current Outpatient Medications (Other):  Marland Kitchen  Diclofenac Sodium (PENNSAID) 2 % SOLN, Place 1 application onto the skin 2 (two) times daily. .  phentermine (ADIPEX-P) 37.5 MG tablet, Take 1 tablet (37.5 mg total) by mouth daily before breakfast.   Reviewed prior external information including notes and imaging from  primary care provider As well as notes that were available from care everywhere and other healthcare systems.  Past medical history, social, surgical and family history all reviewed in electronic medical record.  No pertanent information unless stated regarding to the chief complaint.   Review of Systems:  No headache, visual changes, nausea, vomiting, diarrhea, constipation, dizziness, abdominal pain, skin rash, fevers, chills, night sweats, weight loss, swollen lymph nodes, , chest pain, shortness of breath, mood changes. POSITIVE muscle aches, body aches, joint swelling  Objective  Blood pressure 120/82, pulse 62, height 5\' 2"  (1.575 m), weight 190 lb (86.2 kg), SpO2 98 %.   General: No apparent distress alert and oriented x3 mood and affect normal, dressed appropriately.  HEENT: Pupils equal, extraocular  movements intact  Respiratory: Patient's speak in full sentences and does not appear short of breath  Cardiovascular: No lower extremity edema, non tender, no erythema  Gait mild antalgic Knee: Right valgus deformity noted.  Abnormal thigh to calf ratio.  Tender to palpation over medial and PF joint line.  ROM full in flexion and extension and lower leg rotation. instability with valgus force.  painful patellar compression. Patellar glide with moderate crepitus. Patellar and quadriceps tendons unremarkable. Hamstring and quadriceps strength is normal. Contralateral knee does have arthritic changes but very minimal tenderness.  Limited musculoskeletal ultrasound was performed and interpreted by Lyndal Pulley  Limited ultrasound of patient's right knee shows the patient does have moderate to severe narrowing of the patellofemoral joint.  No significant inflammation are noted.  Patient does have narrowing also of the medial joint space posteriorly but still not bone-on-bone.  Degenerative meniscal changes noted of the medial meniscus.  Lateral portion of the knee seems to be fairly unremarkable. Impression: Patellofemoral and medial joint arthritis   Impression and Recommendations:     The above documentation has been reviewed and is accurate and complete Lyndal Pulley, DO

## 2020-05-02 ENCOUNTER — Other Ambulatory Visit: Payer: Self-pay

## 2020-05-02 ENCOUNTER — Ambulatory Visit: Payer: 59 | Admitting: Family Medicine

## 2020-05-02 ENCOUNTER — Encounter: Payer: Self-pay | Admitting: Family Medicine

## 2020-05-02 ENCOUNTER — Ambulatory Visit: Payer: Self-pay

## 2020-05-02 VITALS — BP 120/82 | HR 62 | Ht 62.0 in | Wt 190.0 lb

## 2020-05-02 DIAGNOSIS — M25561 Pain in right knee: Secondary | ICD-10-CM | POA: Diagnosis not present

## 2020-05-02 DIAGNOSIS — E790 Hyperuricemia without signs of inflammatory arthritis and tophaceous disease: Secondary | ICD-10-CM

## 2020-05-02 DIAGNOSIS — M1711 Unilateral primary osteoarthritis, right knee: Secondary | ICD-10-CM | POA: Diagnosis not present

## 2020-05-02 DIAGNOSIS — G8929 Other chronic pain: Secondary | ICD-10-CM

## 2020-05-02 HISTORY — DX: Hyperuricemia without signs of inflammatory arthritis and tophaceous disease: E79.0

## 2020-05-02 NOTE — Assessment & Plan Note (Signed)
Patient did have this previously.  Do think it is worth potentially checking again.  Patient is supposed to have lab work with primary care physician sometime in the near future and will recheck.  If elevated again consider either starting allopurinol for possible change from the hydrochlorothiazide.

## 2020-05-02 NOTE — Assessment & Plan Note (Signed)
Right knee is worse than left.  Patient does have it on both sides.  Recently been put on meloxicam and is having some improvement.  On ultrasound today very minimal swelling but moderate to severe arthritic changes of the patellofemoral and medial joint space.  Patient could be a candidate for viscosupplementation and will try to get approval.  Discussed icing regimen.  Patient can continue topical anti-inflammatories if necessary as well.  Patient has not noticed significant improvement.  Follow-up with me again 4 to 6 weeks

## 2020-05-02 NOTE — Patient Instructions (Signed)
Good to see you.  Turmeric 500mg  daily  Tart cherry extract 1200mg  at night Vitamin D 2000 IU daily  Will get you approved for gel injection See me again in 3 weeks

## 2020-05-13 MED FILL — Meloxicam Tab 7.5 MG: ORAL | 30 days supply | Qty: 30 | Fill #0 | Status: AC

## 2020-05-13 MED FILL — Rosuvastatin Calcium Tab 10 MG: ORAL | 30 days supply | Qty: 30 | Fill #0 | Status: AC

## 2020-05-14 ENCOUNTER — Other Ambulatory Visit (HOSPITAL_BASED_OUTPATIENT_CLINIC_OR_DEPARTMENT_OTHER): Payer: Self-pay

## 2020-05-22 NOTE — Progress Notes (Signed)
[place] Phone: [phone] Subjective:   Stephanie Castro, am serving as a scribe for Dr. Hulan Saas. This visit occurred during the SARS-CoV-2 public health emergency.  Safety protocols were in place, including screening questions prior to the visit, additional usage of staff PPE, and extensive cleaning of exam room while observing appropriate contact time as indicated for disinfecting solutions.   I'm seeing this patient by the request  of:  [name], DO  CC: Right knee pain  [id number]   05/02/2020 Patient did have this previously.  Do think it is worth potentially checking again.  Patient is supposed to have lab work with primary care physician sometime in the near future and will recheck.  If elevated again consider either starting allopurinol for possible change from the hydrochlorothiazide.  Update 05/23/2020 Stephanie Castro is a 55 y.o. female coming in with complaint of R knee pain. Patient states today she is doing well. States it gets easier as the weight comes off.  Patient denies any new symptoms.  Patient would like to be able to do more activity and will be traveling.  Has not noticed any significant improvement with the steroid injections previously.  X-rays taken in 2021 showed that patient did have severe medial joint and patellofemoral arthritis.  Past Medical History:  Diagnosis Date  . Arthritis    both knees , back and elbows  . BMI 35.0-35.9,adult 05/15/2013  . Breast cancer screening, high risk patient 04/04/2013   Pt states she had mmg 2015 nml results, ordered by Dr  Ruben Gottron, [facility] health care. Results are not in Care everywhere. Mammogram completed 06/12/2015 at [facility] in [place], BI-RADS Category 1   . Cardiac murmur 10/19/2019  . Concern about female breast disease without diagnosis 03/17/2018   . DOE (dyspnea on exertion) 08/08/2015  . Dysplastic nevus    beneath r eye  . Edema of extremities 07/07/2013   Last Assessment & Plan:  Pt takes HCTZ daily.   Last Assessment & Plan:  Formatting of this note might be different from the original. Pt takes HCTZ daily.  . Essential hypertension 02/26/2017  . Exercise-induced asthma   . Fatigue 07/06/2013   Last Assessment & Plan:  Will review previous records from previous PCP, pt had thyroid done recently and was wnl. Pt had questions re lupus/MS and how to get tested for these.   Last Assessment & Plan:  Formatting of this note might be different from the original. Will review previous records from previous PCP, pt had thyroid done recently and was wnl. Pt had questions re lupus/MS and how to get   . GERD (gastroesophageal reflux disease)   . Lateral epicondylitis of both elbows 08/06/2018  . Low TSH level 04/22/2013  . Myalgia 07/06/2013   Last Assessment & Plan:  Formatting of this note might be different from the original. Will check labs today including sed rate, ANA/RF.  Last Assessment & Plan:  Formatting of this note might be different from the original. Pt would like to be tested for lupus in the future. Has been tested for RA and was negative  . Other and unspecified hyperlipidemia 04/06/2011  . Perimenopausal 05/15/2013  . Periodic edema 04/04/2013   Pt reports taking "water pill" for many years due to feeling "swollen". Historically, took HCTZ but became hypokalemic. She was switched to triamterene-HCTZ with no more  hypokalemic episodes.    . Primary localized osteoarthritis of right knee 10/13/2014  . Squamous cell carcinoma, face 02/24/2014  . Tear of medial meniscus of right knee 10/13/2014  . Vitamin B12 deficiency 05/15/2013   Last Assessment & Plan:  Formatting of this note might be different from the original. Recheck level today, may need to restart monthly Vit B12 shots  . Vitamin D deficiency 02/24/2014  . Weight gain 04/24/2015    Past Surgical History:  Procedure Laterality Date  . ABDOMINAL HYSTERECTOMY  2007  . KNEE ARTHROSCOPY WITH MEDIAL MENISECTOMY Right 10/13/2014   Procedure: RIGHT KNEE ARTHROSCOPY WITH PARTIAL MEDIAL MENISECTOMY AND CHONDROPLASTY ;  Surgeon: Marchia Bond, MD;  Location: [facility];  Service: Orthopedics;  Laterality: Right;   Social History   Socioeconomic History  . Marital status: Married    Spouse name: Not on file  . Number of children: Not on file  . Years of education: Not on file  . Highest education level: Not on file  Occupational History  . Not on file  Tobacco Use  . Smoking status: Former Smoker    Packs/day: 1.00    Years: 20.00    Pack years: 20.00    Types: Cigarettes    Quit date: 2005    Years since quitting: 17.3  . Smokeless tobacco: Never Used  Vaping Use  . Vaping Use: Never used  Substance and Sexual Activity  . Alcohol use: Yes    Alcohol/week: 0.0 standard drinks    Comment: social  . Drug use: No  . Sexual activity: Yes    Birth control/protection: Surgical  Other Topics Concern  . Not on file  Social History Narrative   Married to [place].    Social Determinants of Health   Financial Resource Strain: Not on file  Food Insecurity: Not on file  Transportation Needs: Not on file  Physical Activity: Not on file  Stress: Not on file  Social Connections: Not on file   Allergies  Allergen Reactions  . Penicillins    Family History  Problem Relation Age of Onset  . Hyperlipidemia Mother   . Breast cancer Mother 33       breast  . Heart attack Father   . COPD Father   . Heart attack Sister   . Hyperlipidemia Sister   . Hypertension Sister   . Hyperlipidemia Maternal Aunt   . Breast cancer Maternal Aunt        breast  . Hypertension Maternal Uncle   . Diabetes Maternal Uncle   . Colon cancer Maternal Uncle        colon  . Hyperlipidemia Maternal Grandmother   . Diabetes Maternal Grandmother   . Breast cancer  Maternal Grandmother        breast  . Diabetes Maternal Grandfather   . Heart attack Sister   . Sudden death Neg Hx      Current Outpatient Medications (Cardiovascular):  .  hydrochlorothiazide (HYDRODIURIL) 25 MG tablet, TAKE 1 TABLET BY MOUTH ONCE DAILY .  rosuvastatin (CRESTOR) 10 MG tablet, TAKE 1 TABLET BY MOUTH ONCE DAILY   Current Outpatient Medications (Analgesics):  .  aspirin EC 81 MG tablet, Take 1 tablet (81 mg total) by mouth daily. Swallow whole. .  meloxicam (MOBIC) 7.5 MG tablet, TAKE 1 TABLET BY MOUTH ONCE DAILY  Current Outpatient Medications (Hematological):  .  cyanocobalamin (,VITAMIN B-12,) 1000 MCG/ML injection, INJECT 1ML INTO THE SKIN EVERY 2 WEEKS  Current Outpatient Medications (  Other):  Marland Kitchen  Diclofenac Sodium (PENNSAID) 2 % SOLN, Place 1 application onto the skin 2 (two) times daily. .  phentermine (ADIPEX-P) 37.5 MG tablet, Take 1 tablet (37.5 mg total) by mouth daily before breakfast.   Reviewed prior external information including notes and imaging from  primary care provider As well as notes that were available from care everywhere and other healthcare systems.  Past medical history, social, surgical and family history all reviewed in electronic medical record.  No pertanent information unless stated regarding to the chief complaint.   Review of Systems:  No headache, visual changes, nausea, vomiting, diarrhea, constipation, dizziness, abdominal pain, skin rash, fevers, chills, night sweats, weight loss, swollen lymph nodes, body aches,chest pain, shortness of breath, mood changes. POSITIVE muscle aches, joint swelling  Objective  Blood pressure 134/90, pulse 74, height 5\' 2"  (1.575 m), weight 187 lb (84.8 kg), SpO2 98 %.   General: No apparent distress alert and oriented x3 mood and affect normal, dressed appropriately.  HEENT: Pupils equal, extraocular movements intact  Respiratory: Patient's speak in full sentences and does not appear short of  breath  Cardiovascular: No lower extremity edema, non tender, no erythema  Gait mild antalgic  MSK:   Right knee exam shows patient does have instability with valgus and varus force.  Trace effusion noted.  Lacks last 5 degrees of flexion.  After informed written and verbal consent, patient was seated on exam table. Right knee was prepped with alcohol swab and utilizing anterolateral approach, patient's right knee space was injected with 60 mg/20mL of Durolane (sodium hyaluronate) in a prefilled syringe was injected easily into the knee through a 22-gauge needle..Patient tolerated the procedure well without immediate complications.   Impression and Recommendations:     The above documentation has been reviewed and is accurate and complete Lyndal Pulley, DO

## 2020-05-23 ENCOUNTER — Ambulatory Visit (HOSPITAL_BASED_OUTPATIENT_CLINIC_OR_DEPARTMENT_OTHER)
Admission: RE | Admit: 2020-05-23 | Discharge: 2020-05-23 | Disposition: A | Discharge status: Home / Self Care | Payer: 59 | Source: Ambulatory Visit | Attending: Family Medicine | Admitting: Family Medicine

## 2020-05-23 ENCOUNTER — Encounter: Payer: Self-pay | Admitting: Family Medicine

## 2020-05-23 ENCOUNTER — Other Ambulatory Visit: Payer: Self-pay

## 2020-05-23 ENCOUNTER — Ambulatory Visit (INDEPENDENT_AMBULATORY_CARE_PROVIDER_SITE_OTHER): Payer: 59 | Admitting: Family Medicine

## 2020-05-23 ENCOUNTER — Other Ambulatory Visit (HOSPITAL_BASED_OUTPATIENT_CLINIC_OR_DEPARTMENT_OTHER): Payer: Self-pay

## 2020-05-23 VITALS — BP 134/90 | HR 74 | Ht 62.0 in | Wt 187.0 lb

## 2020-05-23 DIAGNOSIS — M1711 Unilateral primary osteoarthritis, right knee: Secondary | ICD-10-CM

## 2020-05-23 DIAGNOSIS — G8929 Other chronic pain: Secondary | ICD-10-CM

## 2020-05-23 DIAGNOSIS — M25562 Pain in left knee: Secondary | ICD-10-CM | POA: Diagnosis present

## 2020-05-23 MED FILL — Hydrochlorothiazide Tab 25 MG: ORAL | 30 days supply | Qty: 30 | Fill #0 | Status: AC

## 2020-05-23 NOTE — Assessment & Plan Note (Signed)
Patient given viscosupplementation today.  Tolerated the procedure well.  We discussed with patient to continue with the home exercises that, continue to work on her weight loss which patient is trying very hard.  We discussed that the elevated uric acid she has had previously still can be playing a role and will be having laboratory work-up.  Patient will get x-rays as well but I do feel that patient has moderate to severe medial joint space and probably would not change medical management.  Patient does have bruising on the contralateral knee and will get an x-ray to make sure there is no significant fracture noted.  Follow-up with me again 6 weeks

## 2020-05-23 NOTE — Patient Instructions (Addendum)
Good to see you Left knee xray at [facility] location Get uric acid checked by primary care See me again in 2 months

## 2020-05-25 ENCOUNTER — Encounter: Payer: Self-pay | Admitting: Family Medicine

## 2020-05-28 ENCOUNTER — Other Ambulatory Visit (HOSPITAL_BASED_OUTPATIENT_CLINIC_OR_DEPARTMENT_OTHER): Payer: Self-pay

## 2020-05-28 ENCOUNTER — Ambulatory Visit: Payer: 59 | Admitting: Family Medicine

## 2020-05-28 ENCOUNTER — Encounter: Payer: Self-pay | Admitting: Family Medicine

## 2020-05-28 ENCOUNTER — Other Ambulatory Visit: Payer: Self-pay

## 2020-05-28 DIAGNOSIS — E669 Obesity, unspecified: Secondary | ICD-10-CM | POA: Diagnosis not present

## 2020-05-28 MED ORDER — PHENTERMINE HCL 37.5 MG PO TABS
37.5000 mg | ORAL_TABLET | Freq: Every day | ORAL | 0 refills | Status: DC
  Filled 2020-05-28 – 2020-06-27 (×2): qty 30, 30d supply, fill #0

## 2020-05-28 MED ORDER — PHENTERMINE HCL 37.5 MG PO TABS
37.5000 mg | ORAL_TABLET | Freq: Every day | ORAL | 0 refills | Status: DC
Start: 2020-05-28 — End: 2020-09-14
  Filled 2020-05-28: qty 30, 30d supply, fill #0

## 2020-05-28 NOTE — Progress Notes (Signed)
Chief Complaint  Patient presents with  . Follow-up    Subjective: Patient is a 55 y.o. female here for follow-up.  She was started on Adipex 37.5 mg daily around 1 month ago.  She has been taking it daily without adverse effects outside of dry mouth.  She denies any palpitations, insomnia, chest pain, shortness of breath.  She has lost around 5 pounds.  She has been trying to improve her diet and stay active.  Past Medical History:  Diagnosis Date  . Arthritis    both knees , back and elbows  . BMI 35.0-35.9,adult 05/15/2013  . Breast cancer screening, high risk patient 04/04/2013   Pt states she had mmg 2015 nml results, ordered by Dr  Ruben Gottron, [facility] health care. Results are not in Care everywhere. Mammogram completed 06/12/2015 at [facility] in [place], BI-RADS Category 1   . Cardiac murmur 10/19/2019  . Concern about female breast disease without diagnosis 03/17/2018  . DOE (dyspnea on exertion) 08/08/2015  . Dysplastic nevus    beneath r eye  . Edema of extremities 07/07/2013   Last Assessment & Plan:  Pt takes HCTZ daily.   Last Assessment & Plan:  Formatting of this note might be different from the original. Pt takes HCTZ daily.  . Essential hypertension 02/26/2017  . Exercise-induced asthma   . Fatigue 07/06/2013   Last Assessment & Plan:  Will review previous records from previous PCP, pt had thyroid done recently and was wnl. Pt had questions re lupus/MS and how to get tested for these.   Last Assessment & Plan:  Formatting of this note might be different from the original. Will review previous records from previous PCP, pt had thyroid done recently and was wnl. Pt had questions re lupus/MS and how to get   . GERD (gastroesophageal reflux disease)   . Lateral epicondylitis of both elbows 08/06/2018  . Low TSH level 04/22/2013  . Myalgia 07/06/2013   Last Assessment & Plan:  Formatting of this note might be  different from the original. Will check labs today including sed rate, ANA/RF.  Last Assessment & Plan:  Formatting of this note might be different from the original. Pt would like to be tested for lupus in the future. Has been tested for RA and was negative  . Other and unspecified hyperlipidemia 04/06/2011  . Perimenopausal 05/15/2013  . Periodic edema 04/04/2013   Pt reports taking "water pill" for many years due to feeling "swollen". Historically, took HCTZ but became hypokalemic. She was switched to triamterene-HCTZ with no more hypokalemic episodes.    . Primary localized osteoarthritis of right knee 10/13/2014  . Squamous cell carcinoma, face 02/24/2014  . Tear of medial meniscus of right knee 10/13/2014  . Vitamin B12 deficiency 05/15/2013   Last Assessment & Plan:  Formatting of this note might be different from the original. Recheck level today, may need to restart monthly Vit B12 shots  . Vitamin D deficiency 02/24/2014  . Weight gain 04/24/2015    Objective: BP 118/70 (BP Location: Left Arm, Patient Position: Sitting, Cuff Size: Normal)   Pulse 75   Temp 98.2 F (36.8 C) (Oral)   Ht 5' 1.5" (1.562 m)   Wt 184 lb 2 oz (83.5 kg)   SpO2 97%   BMI 34.23 kg/m  General: Awake, appears stated age Neuro: Patellar DTRs equal and symmetric, no clonus Heart: RRR, no murmurs Lungs: CTAB, no rales, wheezes or  rhonchi. No accessory muscle use Psych: Age appropriate judgment and insight, normal affect and mood  Assessment and Plan: Obesity (BMI 30-39.9) - Plan: phentermine (ADIPEX-P) 37.5 MG tablet  Counseled on diet and exercise.  Appears to be efficacious with mild side effects.  She wishes to continue.  I will refill 2 more months of phentermine and then we will stop.  I will see her in 4 months for her physical as originally scheduled. The patient voiced understanding and agreement to the plan.  [place], DO 05/28/20  8:59 AM

## 2020-05-28 NOTE — Patient Instructions (Addendum)
We will stay on the Adipex for the full 3 months.   Keep the diet clean and stay active.  Let us know if you need anything.

## 2020-06-04 ENCOUNTER — Encounter: Payer: Self-pay | Admitting: Family Medicine

## 2020-06-11 MED FILL — Meloxicam Tab 7.5 MG: ORAL | 30 days supply | Qty: 30 | Fill #1 | Status: AC

## 2020-06-11 MED FILL — Rosuvastatin Calcium Tab 10 MG: ORAL | 30 days supply | Qty: 30 | Fill #1 | Status: AC

## 2020-06-12 ENCOUNTER — Other Ambulatory Visit (HOSPITAL_BASED_OUTPATIENT_CLINIC_OR_DEPARTMENT_OTHER): Payer: Self-pay

## 2020-06-21 ENCOUNTER — Ambulatory Visit: Payer: 59 | Admitting: Family Medicine

## 2020-06-22 ENCOUNTER — Other Ambulatory Visit (HOSPITAL_BASED_OUTPATIENT_CLINIC_OR_DEPARTMENT_OTHER): Payer: Self-pay

## 2020-06-22 MED FILL — Hydrochlorothiazide Tab 25 MG: ORAL | 30 days supply | Qty: 30 | Fill #1 | Status: AC

## 2020-06-27 ENCOUNTER — Other Ambulatory Visit: Payer: Self-pay | Admitting: Family Medicine

## 2020-06-27 ENCOUNTER — Other Ambulatory Visit (HOSPITAL_BASED_OUTPATIENT_CLINIC_OR_DEPARTMENT_OTHER): Payer: Self-pay

## 2020-06-27 DIAGNOSIS — E669 Obesity, unspecified: Secondary | ICD-10-CM

## 2020-07-13 ENCOUNTER — Other Ambulatory Visit: Payer: Self-pay | Admitting: Family Medicine

## 2020-07-13 ENCOUNTER — Other Ambulatory Visit (HOSPITAL_BASED_OUTPATIENT_CLINIC_OR_DEPARTMENT_OTHER): Payer: Self-pay

## 2020-07-13 DIAGNOSIS — G8929 Other chronic pain: Secondary | ICD-10-CM

## 2020-07-13 MED ORDER — MELOXICAM 7.5 MG PO TABS
ORAL_TABLET | Freq: Every day | ORAL | 2 refills | Status: DC
  Filled 2020-07-13: qty 30, 30d supply, fill #0
  Filled 2020-08-06: qty 30, 30d supply, fill #1
  Filled 2020-09-14: qty 30, 30d supply, fill #2

## 2020-07-13 MED FILL — Rosuvastatin Calcium Tab 10 MG: ORAL | 30 days supply | Qty: 30 | Fill #2 | Status: AC

## 2020-07-23 ENCOUNTER — Other Ambulatory Visit (HOSPITAL_BASED_OUTPATIENT_CLINIC_OR_DEPARTMENT_OTHER): Payer: Self-pay

## 2020-07-23 MED FILL — Hydrochlorothiazide Tab 25 MG: ORAL | 30 days supply | Qty: 30 | Fill #2 | Status: AC

## 2020-07-25 ENCOUNTER — Encounter: Payer: Self-pay | Admitting: Family Medicine

## 2020-07-25 NOTE — Telephone Encounter (Signed)
Added to wait list.

## 2020-07-26 ENCOUNTER — Ambulatory Visit: Payer: 59 | Admitting: Family Medicine

## 2020-08-06 ENCOUNTER — Other Ambulatory Visit (HOSPITAL_BASED_OUTPATIENT_CLINIC_OR_DEPARTMENT_OTHER): Payer: Self-pay

## 2020-08-06 MED FILL — Rosuvastatin Calcium Tab 10 MG: ORAL | 30 days supply | Qty: 30 | Fill #3 | Status: AC

## 2020-08-21 ENCOUNTER — Other Ambulatory Visit (HOSPITAL_BASED_OUTPATIENT_CLINIC_OR_DEPARTMENT_OTHER): Payer: Self-pay

## 2020-08-21 MED FILL — Hydrochlorothiazide Tab 25 MG: ORAL | 30 days supply | Qty: 30 | Fill #3 | Status: AC

## 2020-08-30 ENCOUNTER — Encounter: Payer: Self-pay | Admitting: Family Medicine

## 2020-08-30 ENCOUNTER — Ambulatory Visit: Payer: 59 | Admitting: Family Medicine

## 2020-08-30 ENCOUNTER — Other Ambulatory Visit: Payer: Self-pay

## 2020-08-30 ENCOUNTER — Other Ambulatory Visit (HOSPITAL_BASED_OUTPATIENT_CLINIC_OR_DEPARTMENT_OTHER): Payer: Self-pay

## 2020-08-30 ENCOUNTER — Ambulatory Visit (INDEPENDENT_AMBULATORY_CARE_PROVIDER_SITE_OTHER): Payer: 59

## 2020-08-30 VITALS — BP 130/82 | HR 79 | Ht 61.5 in | Wt 188.0 lb

## 2020-08-30 DIAGNOSIS — E790 Hyperuricemia without signs of inflammatory arthritis and tophaceous disease: Secondary | ICD-10-CM | POA: Diagnosis not present

## 2020-08-30 DIAGNOSIS — M25552 Pain in left hip: Secondary | ICD-10-CM

## 2020-08-30 DIAGNOSIS — E559 Vitamin D deficiency, unspecified: Secondary | ICD-10-CM

## 2020-08-30 DIAGNOSIS — M7062 Trochanteric bursitis, left hip: Secondary | ICD-10-CM

## 2020-08-30 DIAGNOSIS — M25562 Pain in left knee: Secondary | ICD-10-CM

## 2020-08-30 HISTORY — DX: Trochanteric bursitis, left hip: M70.62

## 2020-08-30 HISTORY — DX: Pain in left knee: M25.562

## 2020-08-30 MED ORDER — VITAMIN D (ERGOCALCIFEROL) 1.25 MG (50000 UNIT) PO CAPS
50000.0000 [IU] | ORAL_CAPSULE | ORAL | 0 refills | Status: DC
  Filled 2020-08-30: qty 4, 28d supply, fill #0
  Filled 2020-10-15: qty 4, 28d supply, fill #1
  Filled 2020-11-05: qty 4, 28d supply, fill #2

## 2020-08-30 NOTE — Patient Instructions (Signed)
Good to see you  Vit D sent in  X ray on your way out  Double tart cherry extract Ill contact your PCP to see if we can switch you of the HCTZ Follow up in 6-8 weeks

## 2020-08-30 NOTE — Assessment & Plan Note (Signed)
Patient is of elevated uric acid and patient did have an flare.  We discussed Reviewed, home exercises, increase activity slowly.  Follow-up with me again in 6 weeks.  Will have primary care switched from HCTZ to another blood pressure medicine.  Patient will increase due to her cherry extract but patient does not want to start the allopurinol at this time.

## 2020-08-30 NOTE — Assessment & Plan Note (Signed)
Patient given injection today and tolerated procedure well.  Discussed icing regimen and home exercises.  Patient will get x-rays to further evaluate the amount of arthritic changes that could be contributing as well.  Discussed icing regimen.  Discussed continued core strengthening proper shoes.  Follow-up again in 6 to 8 weeks.

## 2020-08-30 NOTE — Assessment & Plan Note (Signed)
Placed patient on a once weekly vitamin D to try to help her with some of the potential for the gout flare as well as help with muscle strengthening.

## 2020-08-30 NOTE — Progress Notes (Signed)
[facility] [place] Phone: [phone] Subjective:    I'm seeing this patient by the request  of:  [name], DO  CC: Right knee pain follow-up  [id number]  Stephanie Castro is a 55 y.o. female coming in with complaint of patient was seen previously in April and given an injection.  Patient did have x-rays at last exam and as well showing the patient did have tricompartmental osteoarthritic changes worse in the medial compartment mostly of the right knee with mild patellofemoral arthritis of the left knee.  Patient was given viscosupplementation in April.  Patient has had to reschedule secondary to [place] in June and traveling in July.  Patient states right knee has some good days and bad days the Gel injection lasted maybe three weeks. Left knee is very painful when something hits/touches it. Patient states that left hip is is bothering her very stiff feels frozen. Is unable to do her piliates. Patient is having more bad days than good days with her pain.        Past Medical History:  Diagnosis Date   Arthritis    both knees , back and elbows   BMI 35.0-35.9,adult 05/15/2013   Breast cancer screening, high risk patient 04/04/2013   Pt states she had mmg 2015 nml results, ordered by Dr  Ruben Gottron, [facility] health care. Results are not in Care everywhere. Mammogram completed 06/12/2015 at [facility] in [place], [place] Category 1    Cardiac murmur 10/19/2019   Concern about female breast disease without diagnosis 03/17/2018   DOE (dyspnea on exertion) 08/08/2015   Dysplastic nevus    beneath r eye   Edema of extremities 07/07/2013   Last Assessment & Plan:  Pt takes HCTZ daily.   Last Assessment & Plan:  Formatting of this note might be different from the original. Pt takes HCTZ daily.   Essential hypertension 02/26/2017   Exercise-induced asthma     Fatigue 07/06/2013   Last Assessment & Plan:  Will review previous records from previous PCP, pt had thyroid done recently and was wnl. Pt had questions re lupus/MS and how to get tested for these.   Last Assessment & Plan:  Formatting of this note might be different from the original. Will review previous records from previous PCP, pt had thyroid done recently and was wnl. Pt had questions re lupus/MS and how to get    GERD (gastroesophageal reflux disease)    Lateral epicondylitis of both elbows 08/06/2018   Low TSH level 04/22/2013   Myalgia 07/06/2013   Last Assessment & Plan:  Formatting of this note might be different from the original. Will check labs today including sed rate, ANA/RF.  Last Assessment & Plan:  Formatting of this note might be different from the original. Pt would like to be tested for lupus in the future. Has been tested for RA and was negative   Other and unspecified hyperlipidemia 04/06/2011   Perimenopausal 05/15/2013   Periodic edema 04/04/2013   Pt reports taking "water pill" for many years due to feeling "swollen". Historically, took HCTZ but became hypokalemic. She was switched to triamterene-HCTZ with no more hypokalemic episodes.     Primary localized osteoarthritis of right knee 10/13/2014   Squamous cell carcinoma, face 02/24/2014   Tear of medial meniscus of right knee 10/13/2014   Vitamin B12 deficiency 05/15/2013   Last Assessment & Plan:  Formatting of this note might be different from the original. Recheck level today, may need to restart monthly Vit B12 shots   Vitamin D deficiency 02/24/2014   Weight gain 04/24/2015   Past Surgical History:  Procedure Laterality Date   ABDOMINAL HYSTERECTOMY  2007   KNEE ARTHROSCOPY WITH MEDIAL MENISECTOMY Right 10/13/2014   Procedure: RIGHT KNEE ARTHROSCOPY WITH PARTIAL MEDIAL MENISECTOMY AND CHONDROPLASTY ;  Surgeon: Marchia Bond, MD;  Location: [facility];  Service: Orthopedics;  Laterality: Right;   Social  History   Socioeconomic History   Marital status: Married    Spouse name: Not on file   Number of children: Not on file   Years of education: Not on file   Highest education level: Not on file  Occupational History   Not on file  Tobacco Use   Smoking status: Former    Packs/day: 1.00    Years: 20.00    Pack years: 20.00    Types: Cigarettes    Quit date: 2005    Years since quitting: 17.6   Smokeless tobacco: Never  Vaping Use   Vaping Use: Never used  Substance and Sexual Activity   Alcohol use: Yes    Alcohol/week: 0.0 standard drinks    Comment: social   Drug use: No   Sexual activity: Yes    Birth control/protection: Surgical  Other Topics Concern   Not on file  Social History Narrative   Married to [place].    Social Determinants of Health   Financial Resource Strain: Not on file  Food Insecurity: Not on file  Transportation Needs: Not on file  Physical Activity: Not on file  Stress: Not on file  Social Connections: Not on file   Allergies  Allergen Reactions   Penicillins    Family History  Problem Relation Age of Onset   Hyperlipidemia Mother    Breast cancer Mother 31       breast   Heart attack Father    COPD Father    Heart attack Sister    Hyperlipidemia Sister    Hypertension Sister    Hyperlipidemia Maternal Aunt    Breast cancer Maternal Aunt        breast   Hypertension Maternal Uncle    Diabetes Maternal Uncle    Colon cancer Maternal Uncle        colon   Hyperlipidemia Maternal Grandmother    Diabetes Maternal Grandmother    Breast cancer Maternal Grandmother        breast   Diabetes Maternal Grandfather    Heart attack Sister    Sudden death Neg Hx      Current Outpatient Medications (Cardiovascular):    hydrochlorothiazide (HYDRODIURIL) 25 MG tablet, TAKE 1 TABLET BY MOUTH ONCE DAILY   rosuvastatin (CRESTOR) 10 MG tablet, TAKE 1 TABLET BY MOUTH ONCE DAILY   Current Outpatient Medications (Analgesics):    aspirin EC 81 MG  tablet, Take 1 tablet (81 mg total) by mouth daily. Swallow whole.   meloxicam (MOBIC) 7.5 MG tablet, TAKE 1 TABLET BY MOUTH ONCE DAILY  Current Outpatient Medications (Hematological):    cyanocobalamin (,VITAMIN B-12,) 1000 MCG/ML injection, INJECT 1ML INTO THE SKIN EVERY 2 WEEKS  Current Outpatient Medications (Other):    Diclofenac Sodium (PENNSAID) 2 % SOLN, Place 1 application onto the skin 2 (two) times daily.   phentermine (ADIPEX-P) 37.5 MG tablet, Take 1 tablet (37.5 mg total) by mouth daily before breakfast.   phentermine (ADIPEX-P) 37.5 MG tablet,  Take 1 tablet (37.5 mg total) by mouth daily before breakfast.   Vitamin D, Ergocalciferol, (DRISDOL) 1.25 MG (50000 UNIT) CAPS capsule, Take 1 capsule (50,000 Units total) by mouth every 7 (seven) days.   Reviewed prior external information including notes and imaging from  primary care provider As well as notes that were available from care everywhere and other healthcare systems.  Past medical history, social, surgical and family history all reviewed in electronic medical record.  No pertanent information unless stated regarding to the chief complaint.   Review of Systems:  No headache, visual changes, nausea, vomiting, diarrhea, constipation, dizziness, abdominal pain, skin rash, fevers, chills, night sweats, weight loss, swollen lymph nodes, body aches, joint swelling, chest pain, shortness of breath, mood changes. POSITIVE muscle aches  Objective  Blood pressure 130/82, pulse 79, height 5' 1.5" (1.562 m), weight 188 lb (85.3 kg), SpO2 97 %.   General: No apparent distress alert and oriented x3 mood and affect normal, dressed appropriately.  HEENT: Pupils equal, extraocular movements intact  Respiratory: Patient's speak in full sentences and does not appear short of breath  Cardiovascular: No lower extremity edema, non tender, no erythema  Gait mild antalgic Knee exam shows on the left side shows the patient does have mild  erythema noted.  Patient does have warm to touch.  Appears to be more gout related.  No sign of any cellulitis though.  Patient is severely tender to even light palpation.  Patient also has severe discomfort over the left greater trochanteric area.  Negative straight leg test.  Positive FABER test with pain on the lateral aspect.  Lacks last 5 degrees of internal rotation of the hip.  After verbal consent patient was prepped with alcohol swab and with a 21-gauge 2 inch needle patient was injected with 2 cc of 0.5% Marcaine and 1 cc of Kenalog 10 mg/mL into the left greater trochanteric area.  No blood loss.  Band-Aid placed.  Postinjection instructions given Impression and Recommendations:     The above documentation has been reviewed and is accurate and complete Lyndal Pulley, DO

## 2020-08-30 NOTE — Assessment & Plan Note (Signed)
Patient did have a fall initially that she thinks could have contributed to some of the pain and we discussed the potential for MRI with patient's x-rays being fairly unremarkable.  Patient unfortunately does have what appears to be developed more of a gout flare that I think is contributing to her today.  Patient told to double the tart cherry at the moment.  Patient declined allopurinol and will ask primary care if we can change the possibility of the HCTZ

## 2020-08-31 ENCOUNTER — Other Ambulatory Visit (HOSPITAL_BASED_OUTPATIENT_CLINIC_OR_DEPARTMENT_OTHER): Payer: Self-pay

## 2020-08-31 ENCOUNTER — Other Ambulatory Visit: Payer: Self-pay | Admitting: Family Medicine

## 2020-08-31 MED ORDER — OLMESARTAN MEDOXOMIL 20 MG PO TABS
20.0000 mg | ORAL_TABLET | Freq: Every day | ORAL | 2 refills | Status: DC
  Filled 2020-08-31: qty 30, 30d supply, fill #0

## 2020-09-05 NOTE — Progress Notes (Deleted)
Office Visit Note  Patient: Stephanie Castro             Date of Birth: November 30, 1965           MRN: [id number]             PCP: [name], DO Referring: [name]* Visit Date: 09/18/2020 Occupation: '@GUAROCC'$ @  Subjective:  No chief complaint on file.   History of Present Illness: Stephanie Castro is a 55 y.o. female ***   Activities of Daily Living:  Patient reports morning stiffness for *** {minute/hour:19697}.   Patient {ACTIONS;DENIES/REPORTS:21021675::"Denies"} nocturnal pain.  Difficulty dressing/grooming: {ACTIONS;DENIES/REPORTS:21021675::"Denies"} Difficulty climbing stairs: {ACTIONS;DENIES/REPORTS:21021675::"Denies"} Difficulty getting out of chair: {ACTIONS;DENIES/REPORTS:21021675::"Denies"} Difficulty using hands for taps, buttons, cutlery, and/or writing: {ACTIONS;DENIES/REPORTS:21021675::"Denies"}  No Rheumatology ROS completed.   PMFS History:  Patient Active Problem List   Diagnosis Date Noted   Greater trochanteric bursitis of left hip 08/30/2020   Left knee pain 08/30/2020   Elevated uric acid in blood 05/02/2020   Chronic pain of right knee 04/13/2020   Mixed dyslipidemia 02/22/2020   Arthritis    Dysplastic nevus    Hypertension    Palpitations 10/19/2019   Cardiac murmur 10/19/2019   Lateral epicondylitis of both elbows 08/06/2018   Concern about female breast disease without diagnosis 03/17/2018   Essential hypertension 02/26/2017   Exercise-induced asthma    DOE (dyspnea on exertion) 08/08/2015   Elevated BP without diagnosis of hypertension 06/15/2015   Weight gain 04/24/2015   Primary localized osteoarthritis of right knee 10/13/2014   Tear of medial meniscus of right knee 10/13/2014   Squamous cell carcinoma, face 02/24/2014   Vitamin D deficiency 02/24/2014   Edema of extremities 07/07/2013   Fatigue 07/06/2013   GERD (gastroesophageal reflux disease) 07/06/2013   Myalgia 07/06/2013   Vitamin B12 deficiency  05/15/2013   BMI 35.0-35.9,adult 05/15/2013   Perimenopausal 05/15/2013   Low TSH level 04/22/2013   Breast cancer screening, high risk patient 04/04/2013   Preventative health care 04/04/2013   Periodic edema 04/04/2013   Other and unspecified hyperlipidemia 04/06/2011    Past Medical History:  Diagnosis Date   Arthritis    both knees , back and elbows   BMI 35.0-35.9,adult 05/15/2013   Breast cancer screening, high risk patient 04/04/2013   Pt states she had mmg 2015 nml results, ordered by Dr  Ruben Gottron, [facility] health care. Results are not in Care everywhere. Mammogram completed 06/12/2015 at [facility] in [place], [place] Category 1    Cardiac murmur 10/19/2019   Concern about female breast disease without diagnosis 03/17/2018   DOE (dyspnea on exertion) 08/08/2015   Dysplastic nevus    beneath r eye   Edema of extremities 07/07/2013   Last Assessment & Plan:  Pt takes HCTZ daily.   Last Assessment & Plan:  Formatting of this note might be different from the original. Pt takes HCTZ daily.   Essential hypertension 02/26/2017   Exercise-induced asthma    Fatigue 07/06/2013   Last Assessment & Plan:  Will review previous records from previous PCP, pt had thyroid done recently and was wnl. Pt had questions re lupus/MS and how to get tested for these.   Last Assessment & Plan:  Formatting of this note might be different from the original. Will review previous records from previous PCP, pt had thyroid done recently and was wnl. Pt had questions re lupus/MS and how to get  GERD (gastroesophageal reflux disease)    Lateral epicondylitis of both elbows 08/06/2018   Low TSH level 04/22/2013   Myalgia 07/06/2013   Last Assessment & Plan:  Formatting of this note might be different from the original. Will check labs today including sed rate, ANA/RF.  Last Assessment & Plan:  Formatting of this note might be different from the  original. Pt would like to be tested for lupus in the future. Has been tested for RA and was negative   Other and unspecified hyperlipidemia 04/06/2011   Perimenopausal 05/15/2013   Periodic edema 04/04/2013   Pt reports taking "water pill" for many years due to feeling "swollen". Historically, took HCTZ but became hypokalemic. She was switched to triamterene-HCTZ with no more hypokalemic episodes.     Primary localized osteoarthritis of right knee 10/13/2014   Squamous cell carcinoma, face 02/24/2014   Tear of medial meniscus of right knee 10/13/2014   Vitamin B12 deficiency 05/15/2013   Last Assessment & Plan:  Formatting of this note might be different from the original. Recheck level today, may need to restart monthly Vit B12 shots   Vitamin D deficiency 02/24/2014   Weight gain 04/24/2015    Family History  Problem Relation Age of Onset   Hyperlipidemia Mother    Breast cancer Mother 53       breast   Heart attack Father    COPD Father    Heart attack Sister    Hyperlipidemia Sister    Hypertension Sister    Hyperlipidemia Maternal Aunt    Breast cancer Maternal Aunt        breast   Hypertension Maternal Uncle    Diabetes Maternal Uncle    Colon cancer Maternal Uncle        colon   Hyperlipidemia Maternal Grandmother    Diabetes Maternal Grandmother    Breast cancer Maternal Grandmother        breast   Diabetes Maternal Grandfather    Heart attack Sister    Sudden death Neg Hx    Past Surgical History:  Procedure Laterality Date   ABDOMINAL HYSTERECTOMY  2007   KNEE ARTHROSCOPY WITH MEDIAL MENISECTOMY Right 10/13/2014   Procedure: RIGHT KNEE ARTHROSCOPY WITH PARTIAL MEDIAL MENISECTOMY AND CHONDROPLASTY ;  Surgeon: Marchia Bond, MD;  Location: [facility];  Service: Orthopedics;  Laterality: Right;   Social History   Social History Narrative   Married to [place].    Immunization History  Administered Date(s) Administered   Tdap 02/15/2008      Objective: Vital Signs: There were no vitals taken for this visit.   Physical Exam   Musculoskeletal Exam: ***  CDAI Exam: CDAI Score: -- Patient Global: --; Provider Global: -- Swollen: --; Tender: -- Joint Exam 09/18/2020   No joint exam has been documented for this visit   There is currently no information documented on the homunculus. Go to the Rheumatology activity and complete the homunculus joint exam.  Investigation: No additional findings.  Imaging: DG HIP UNILAT WITH PELVIS 2-3 VIEWS LEFT  Result Date: 09/01/2020 CLINICAL DATA:  LEFT hip pain and stiffness for 2 weeks, no known injury EXAM: DG HIP (WITH OR WITHOUT PELVIS) 2-3V LEFT COMPARISON:  None FINDINGS: Osseous demineralization. SI joint spaces preserved. Minimal narrowing of LEFT hip joint space versus RIGHT. No fracture, dislocation, or bone destruction. IMPRESSION: Minimal degenerative changes of the LEFT hip joint. Electronically Signed   By: Lavonia Dana M.D.   On: 09/01/2020 10:48  Recent Labs: Lab Results  Component Value Date   WBC 6.0 09/14/2019   HGB 12.6 09/14/2019   PLT 171.0 09/14/2019   NA 140 03/07/2020   K 3.7 03/07/2020   CL 104 03/07/2020   CO2 23 03/07/2020   GLUCOSE 85 03/07/2020   BUN 12 03/07/2020   CREATININE 0.78 03/07/2020   BILITOT 0.4 03/07/2020   ALKPHOS 81 03/07/2020   AST 14 03/07/2020   ALT 10 03/07/2020   PROT 6.3 03/07/2020   ALBUMIN 4.2 03/07/2020   CALCIUM 9.1 03/07/2020   GFRAA 99 03/07/2020    Speciality Comments: No specialty comments available.  Procedures:  No procedures performed Allergies: Penicillins   Assessment / Plan:     Visit Diagnoses: No diagnosis found.  Orders: No orders of the defined types were placed in this encounter.  No orders of the defined types were placed in this encounter.   Face-to-face time spent with patient was *** minutes. Greater than 50% of time was spent in counseling and coordination of care.  Follow-Up  Instructions: No follow-ups on file.   Earnestine Mealing, CMA  Note - This record has been created using Editor, commissioning.  Chart creation errors have been sought, but may not always  have been located. Such creation errors do not reflect on  the standard of medical care.

## 2020-09-14 ENCOUNTER — Encounter: Payer: Self-pay | Admitting: Family Medicine

## 2020-09-14 ENCOUNTER — Other Ambulatory Visit: Payer: Self-pay

## 2020-09-14 ENCOUNTER — Other Ambulatory Visit (HOSPITAL_BASED_OUTPATIENT_CLINIC_OR_DEPARTMENT_OTHER): Payer: Self-pay

## 2020-09-14 ENCOUNTER — Ambulatory Visit: Payer: 59 | Admitting: Family Medicine

## 2020-09-14 VITALS — BP 118/68 | HR 60 | Temp 98.3°F | Ht 62.0 in | Wt 191.4 lb

## 2020-09-14 DIAGNOSIS — Z6835 Body mass index (BMI) 35.0-35.9, adult: Secondary | ICD-10-CM

## 2020-09-14 DIAGNOSIS — I10 Essential (primary) hypertension: Secondary | ICD-10-CM

## 2020-09-14 DIAGNOSIS — E66812 Morbid (severe) obesity due to excess calories: Secondary | ICD-10-CM

## 2020-09-14 HISTORY — DX: Morbid (severe) obesity due to excess calories: E66.812

## 2020-09-14 HISTORY — DX: Morbid (severe) obesity due to excess calories: E66.01

## 2020-09-14 HISTORY — DX: Morbid (severe) obesity due to excess calories: Z68.35

## 2020-09-14 MED ORDER — OLMESARTAN MEDOXOMIL 20 MG PO TABS: 10.0000 mg | ORAL_TABLET | Freq: Every day | ORAL | 2 refills | Status: DC

## 2020-09-14 MED FILL — Rosuvastatin Calcium Tab 10 MG: ORAL | 30 days supply | Qty: 30 | Fill #4 | Status: AC

## 2020-09-14 NOTE — Progress Notes (Signed)
Chief Complaint  Patient presents with   Follow-up    Blood pressure medication    Subjective Stephanie Castro is a 55 y.o. female who presents for hypertension follow up. She does monitor home blood pressures. Blood pressures ranging from 90-100's/40-60's on average. She is compliant with medication- olmesartan 20 mg/d just changed from hydrochlorothiazide 25 mg daily due to concern for hyperuricemia. Patient has these side effects of medication: bloating, light headed She is adhering to a healthy diet overall. Current exercise: pilates No CP or SOB.  Obesity The patient was on 3 months of phentermine that did help her lose a little bit of weight.  She has been off of it and starting to gain weight again.  She does Pilates 4 times weekly and adheres to a low calorie diet.  She does not like meat or fish so her protein intake is lower than she would like.   Past Medical History:  Diagnosis Date   Arthritis    both knees , back and elbows   BMI 35.0-35.9,adult 05/15/2013   Breast cancer screening, high risk patient 04/04/2013   Pt states she had mmg 2015 nml results, ordered by Dr  Ruben Gottron, [facility] health care. Results are not in Care everywhere. Mammogram completed 06/12/2015 at [facility] in [place], [place] Category 1    Cardiac murmur 10/19/2019   Concern about female breast disease without diagnosis 03/17/2018   DOE (dyspnea on exertion) 08/08/2015   Dysplastic nevus    beneath r eye   Edema of extremities 07/07/2013   Last Assessment & Plan:  Pt takes HCTZ daily.   Last Assessment & Plan:  Formatting of this note might be different from the original. Pt takes HCTZ daily.   Essential hypertension 02/26/2017   Exercise-induced asthma    Fatigue 07/06/2013   Last Assessment & Plan:  Will review previous records from previous PCP, pt had thyroid done recently and was wnl. Pt had questions re lupus/MS and how to get  tested for these.   Last Assessment & Plan:  Formatting of this note might be different from the original. Will review previous records from previous PCP, pt had thyroid done recently and was wnl. Pt had questions re lupus/MS and how to get    GERD (gastroesophageal reflux disease)    Lateral epicondylitis of both elbows 08/06/2018   Low TSH level 04/22/2013   Myalgia 07/06/2013   Last Assessment & Plan:  Formatting of this note might be different from the original. Will check labs today including sed rate, ANA/RF.  Last Assessment & Plan:  Formatting of this note might be different from the original. Pt would like to be tested for lupus in the future. Has been tested for RA and was negative   Other and unspecified hyperlipidemia 04/06/2011   Perimenopausal 05/15/2013   Periodic edema 04/04/2013   Pt reports taking "water pill" for many years due to feeling "swollen". Historically, took HCTZ but became hypokalemic. She was switched to triamterene-HCTZ with no more hypokalemic episodes.     Primary localized osteoarthritis of right knee 10/13/2014   Squamous cell carcinoma, face 02/24/2014   Tear of medial meniscus of right knee 10/13/2014   Vitamin B12 deficiency 05/15/2013   Last Assessment & Plan:  Formatting of this note might be different from the original. Recheck level today, may need to restart monthly Vit B12 shots   Vitamin D deficiency 02/24/2014   Weight gain  04/24/2015    Exam BP 118/68   Pulse 60   Temp 98.3 F (36.8 C) (Oral)   Ht '5\' 2"'$  (1.575 m)   Wt 191 lb 6 oz (86.8 kg)   SpO2 99%   BMI 35.00 kg/m  General:  well developed, well nourished, in no apparent distress Heart: RRR, no bruits, no LE edema Lungs: clear to auscultation, no accessory muscle use Psych: well oriented with normal range of affect and appropriate judgment/insight  Essential hypertension  Class 2 severe obesity due to excess calories with serious comorbidity and body mass index (BMI) of 35.0 to 35.9 in adult  [facility])  Chronic, blood pressure controlled but having side effect.  Decrease dosage of olmesartan from 20 mg daily to 10 mg daily.  Continue to monitor blood pressures at home.  Counseled on diet and exercise. Counseled on diet and exercise.  We discussed bariatric surgery in addition to the medical weight loss team.  She will send me a message after researching them with her decision. F/u in 1 month as originally scheduled. The patient voiced understanding and agreement to the plan.  [place], DO 09/14/20  4:42 PM

## 2020-09-14 NOTE — Patient Instructions (Addendum)
[email or web address]  You can consider bariatric surgery as well. [place] bariatric surgery for more information.   Keep the diet clean and stay active.  Let us know if you need anything.

## 2020-09-18 ENCOUNTER — Ambulatory Visit: Payer: 59 | Admitting: Rheumatology

## 2020-09-18 DIAGNOSIS — Z8261 Family history of arthritis: Secondary | ICD-10-CM

## 2020-09-18 DIAGNOSIS — M170 Bilateral primary osteoarthritis of knee: Secondary | ICD-10-CM

## 2020-09-18 DIAGNOSIS — M47816 Spondylosis without myelopathy or radiculopathy, lumbar region: Secondary | ICD-10-CM

## 2020-09-18 DIAGNOSIS — M7711 Lateral epicondylitis, right elbow: Secondary | ICD-10-CM

## 2020-09-18 DIAGNOSIS — Z8719 Personal history of other diseases of the digestive system: Secondary | ICD-10-CM

## 2020-09-18 DIAGNOSIS — M2550 Pain in unspecified joint: Secondary | ICD-10-CM

## 2020-09-18 DIAGNOSIS — C44320 Squamous cell carcinoma of skin of unspecified parts of face: Secondary | ICD-10-CM

## 2020-09-18 DIAGNOSIS — E538 Deficiency of other specified B group vitamins: Secondary | ICD-10-CM

## 2020-09-18 DIAGNOSIS — M7062 Trochanteric bursitis, left hip: Secondary | ICD-10-CM

## 2020-09-18 DIAGNOSIS — E559 Vitamin D deficiency, unspecified: Secondary | ICD-10-CM

## 2020-09-18 DIAGNOSIS — G8929 Other chronic pain: Secondary | ICD-10-CM

## 2020-09-18 DIAGNOSIS — M19041 Primary osteoarthritis, right hand: Secondary | ICD-10-CM

## 2020-09-18 DIAGNOSIS — J45990 Exercise induced bronchospasm: Secondary | ICD-10-CM

## 2020-09-18 DIAGNOSIS — R768 Other specified abnormal immunological findings in serum: Secondary | ICD-10-CM

## 2020-09-18 DIAGNOSIS — I10 Essential (primary) hypertension: Secondary | ICD-10-CM

## 2020-09-18 DIAGNOSIS — M79671 Pain in right foot: Secondary | ICD-10-CM

## 2020-09-20 NOTE — Progress Notes (Deleted)
Office Visit Note  Patient: Stephanie Castro             Date of Birth: March 23, 1965           MRN: [id number]             PCP: [name], DO Referring: [name]* Visit Date: 10/03/2020 Occupation: '@GUAROCC'$ @  Subjective:  No chief complaint on file.   History of Present Illness: Stephanie Castro is a 55 y.o. female ***   Activities of Daily Living:  Patient reports morning stiffness for *** {minute/hour:19697}.   Patient {ACTIONS;DENIES/REPORTS:21021675::"Denies"} nocturnal pain.  Difficulty dressing/grooming: {ACTIONS;DENIES/REPORTS:21021675::"Denies"} Difficulty climbing stairs: {ACTIONS;DENIES/REPORTS:21021675::"Denies"} Difficulty getting out of chair: {ACTIONS;DENIES/REPORTS:21021675::"Denies"} Difficulty using hands for taps, buttons, cutlery, and/or writing: {ACTIONS;DENIES/REPORTS:21021675::"Denies"}  No Rheumatology ROS completed.   PMFS History:  Patient Active Problem List   Diagnosis Date Noted   Class 2 severe obesity due to excess calories with serious comorbidity and body mass index (BMI) of 35.0 to 35.9 in adult ([place]) 09/14/2020   Greater trochanteric bursitis of left hip 08/30/2020   Left knee pain 08/30/2020   Elevated uric acid in blood 05/02/2020   Chronic pain of right knee 04/13/2020   Mixed dyslipidemia 02/22/2020   Arthritis    Dysplastic nevus    Hypertension    Palpitations 10/19/2019   Cardiac murmur 10/19/2019   Lateral epicondylitis of both elbows 08/06/2018   Concern about female breast disease without diagnosis 03/17/2018   Essential hypertension 02/26/2017   Exercise-induced asthma    DOE (dyspnea on exertion) 08/08/2015   Elevated BP without diagnosis of hypertension 06/15/2015   Weight gain 04/24/2015   Primary localized osteoarthritis of right knee 10/13/2014   Tear of medial meniscus of right knee 10/13/2014   Squamous cell carcinoma, face 02/24/2014   Vitamin D deficiency 02/24/2014   Edema of extremities  07/07/2013   Fatigue 07/06/2013   GERD (gastroesophageal reflux disease) 07/06/2013   Myalgia 07/06/2013   Vitamin B12 deficiency 05/15/2013   BMI 35.0-35.9,adult 05/15/2013   Perimenopausal 05/15/2013   Low TSH level 04/22/2013   Breast cancer screening, high risk patient 04/04/2013   Preventative health care 04/04/2013   Periodic edema 04/04/2013   Other and unspecified hyperlipidemia 04/06/2011    Past Medical History:  Diagnosis Date   Arthritis    both knees , back and elbows   BMI 35.0-35.9,adult 05/15/2013   Breast cancer screening, high risk patient 04/04/2013   Pt states she had mmg 2015 nml results, ordered by Dr  Ruben Gottron, [facility] health care. Results are not in Care everywhere. Mammogram completed 06/12/2015 at [facility] in [place], [place] Category 1    Cardiac murmur 10/19/2019   Concern about female breast disease without diagnosis 03/17/2018   DOE (dyspnea on exertion) 08/08/2015   Dysplastic nevus    beneath r eye   Edema of extremities 07/07/2013   Last Assessment & Plan:  Pt takes HCTZ daily.   Last Assessment & Plan:  Formatting of this note might be different from the original. Pt takes HCTZ daily.   Essential hypertension 02/26/2017   Exercise-induced asthma    Fatigue 07/06/2013   Last Assessment & Plan:  Will review previous records from previous PCP, pt had thyroid done recently and was wnl. Pt had questions re lupus/MS and how to get tested for these.   Last Assessment & Plan:  Formatting of this note might be different from the original.  Will review previous records from previous PCP, pt had thyroid done recently and was wnl. Pt had questions re lupus/MS and how to get    GERD (gastroesophageal reflux disease)    Lateral epicondylitis of both elbows 08/06/2018   Low TSH level 04/22/2013   Myalgia 07/06/2013   Last Assessment & Plan:  Formatting of this note might be different from the  original. Will check labs today including sed rate, ANA/RF.  Last Assessment & Plan:  Formatting of this note might be different from the original. Pt would like to be tested for lupus in the future. Has been tested for RA and was negative   Other and unspecified hyperlipidemia 04/06/2011   Perimenopausal 05/15/2013   Periodic edema 04/04/2013   Pt reports taking "water pill" for many years due to feeling "swollen". Historically, took HCTZ but became hypokalemic. She was switched to triamterene-HCTZ with no more hypokalemic episodes.     Primary localized osteoarthritis of right knee 10/13/2014   Squamous cell carcinoma, face 02/24/2014   Tear of medial meniscus of right knee 10/13/2014   Vitamin B12 deficiency 05/15/2013   Last Assessment & Plan:  Formatting of this note might be different from the original. Recheck level today, may need to restart monthly Vit B12 shots   Vitamin D deficiency 02/24/2014   Weight gain 04/24/2015    Family History  Problem Relation Age of Onset   Hyperlipidemia Mother    Breast cancer Mother 64       breast   Heart attack Father    COPD Father    Heart attack Sister    Hyperlipidemia Sister    Hypertension Sister    Hyperlipidemia Maternal Aunt    Breast cancer Maternal Aunt        breast   Hypertension Maternal Uncle    Diabetes Maternal Uncle    Colon cancer Maternal Uncle        colon   Hyperlipidemia Maternal Grandmother    Diabetes Maternal Grandmother    Breast cancer Maternal Grandmother        breast   Diabetes Maternal Grandfather    Heart attack Sister    Sudden death Neg Hx    Past Surgical History:  Procedure Laterality Date   ABDOMINAL HYSTERECTOMY  2007   KNEE ARTHROSCOPY WITH MEDIAL MENISECTOMY Right 10/13/2014   Procedure: RIGHT KNEE ARTHROSCOPY WITH PARTIAL MEDIAL MENISECTOMY AND CHONDROPLASTY ;  Surgeon: Marchia Bond, MD;  Location: [facility];  Service: Orthopedics;  Laterality: Right;   Social History   Social  History Narrative   Married to [place].    Immunization History  Administered Date(s) Administered   Tdap 02/15/2008     Objective: Vital Signs: There were no vitals taken for this visit.   Physical Exam   Musculoskeletal Exam: ***  CDAI Exam: CDAI Score: -- Patient Global: --; Provider Global: -- Swollen: --; Tender: -- Joint Exam 10/03/2020   No joint exam has been documented for this visit   There is currently no information documented on the homunculus. Go to the Rheumatology activity and complete the homunculus joint exam.  Investigation: No additional findings.  Imaging: DG HIP UNILAT WITH PELVIS 2-3 VIEWS LEFT  Result Date: 09/01/2020 CLINICAL DATA:  LEFT hip pain and stiffness for 2 weeks, no known injury EXAM: DG HIP (WITH OR WITHOUT PELVIS) 2-3V LEFT COMPARISON:  None FINDINGS: Osseous demineralization. SI joint spaces preserved. Minimal narrowing of LEFT hip joint space versus RIGHT. No fracture, dislocation, or  bone destruction. IMPRESSION: Minimal degenerative changes of the LEFT hip joint. Electronically Signed   By: Lavonia Dana M.D.   On: 09/01/2020 10:48    Recent Labs: Lab Results  Component Value Date   WBC 6.0 09/14/2019   HGB 12.6 09/14/2019   PLT 171.0 09/14/2019   NA 140 03/07/2020   K 3.7 03/07/2020   CL 104 03/07/2020   CO2 23 03/07/2020   GLUCOSE 85 03/07/2020   BUN 12 03/07/2020   CREATININE 0.78 03/07/2020   BILITOT 0.4 03/07/2020   ALKPHOS 81 03/07/2020   AST 14 03/07/2020   ALT 10 03/07/2020   PROT 6.3 03/07/2020   ALBUMIN 4.2 03/07/2020   CALCIUM 9.1 03/07/2020   GFRAA 99 03/07/2020    Speciality Comments: No specialty comments available.  Procedures:  No procedures performed Allergies: Penicillins   Assessment / Plan:     Visit Diagnoses: No diagnosis found.  Orders: No orders of the defined types were placed in this encounter.  No orders of the defined types were placed in this encounter.   Face-to-face time spent  with patient was *** minutes. Greater than 50% of time was spent in counseling and coordination of care.  Follow-Up Instructions: No follow-ups on file.   Earnestine Mealing, CMA  Note - This record has been created using Editor, commissioning.  Chart creation errors have been sought, but may not always  have been located. Such creation errors do not reflect on  the standard of medical care.

## 2020-09-26 ENCOUNTER — Other Ambulatory Visit (HOSPITAL_BASED_OUTPATIENT_CLINIC_OR_DEPARTMENT_OTHER): Payer: Self-pay

## 2020-09-26 ENCOUNTER — Other Ambulatory Visit: Payer: Self-pay | Admitting: Family Medicine

## 2020-09-26 MED ORDER — OZEMPIC (0.25 OR 0.5 MG/DOSE) 2 MG/1.5ML ~~LOC~~ SOPN
PEN_INJECTOR | SUBCUTANEOUS | 1 refills | Status: DC
  Filled 2020-09-26: qty 1.5, 28d supply, fill #0

## 2020-09-26 NOTE — Progress Notes (Signed)
History  Office Visit Note  Patient: Stephanie Castro             Date of Birth: March 20, 1965           MRN: [id number]             PCP: [name], DO Referring: [name]* Visit Date: 10/09/2020 Occupation: '@GUAROCC'$ @  Subjective:  Other (Left hip pain- patient reports she had a recent injection approximately 1 month ago )   History of Present Illness: Stephanie Castro is a 55 y.o. female we have osteoarthritis.  She states she continues to have some stiffness in her hands.  She has been having increased left hip pain which she describes over the left trochanteric bursa.  She states she had an injection about a month ago by Dr. Tamala Julian which helped for some time.  She also complains of discomfort in the bilateral knee joints due to underlying osteoarthritis.  She has had a viscosupplementation junctions in her right knee joint which helped to some extent as well.  She continues to have discomfort walking due to left trochanteric bursa pain.  She denies any history of joint swelling.  There is no history of oral ulcers, nasal ulcers, malar rash, photosensitivity, sicca symptoms, Raynaud's phenomenon or lymphadenopathy.  Activities of Daily Living:  Patient reports morning stiffness for 20-30 minutes.   Patient Reports nocturnal pain.  Difficulty dressing/grooming: Reports Difficulty climbing stairs: Reports Difficulty getting out of chair: Denies Difficulty using hands for taps, buttons, cutlery, and/or writing: Denies  Review of Systems  Constitutional:  Negative for fatigue.  HENT:  Negative for mouth sores, mouth dryness and nose dryness.   Eyes:  Negative for pain, itching and dryness.  Respiratory:  Negative for shortness of breath and difficulty breathing.   Cardiovascular:  Negative for chest pain and palpitations.  Gastrointestinal:  Negative for blood in stool, constipation and diarrhea.  Endocrine: Negative for increased urination.  Genitourinary:  Negative  for difficulty urinating.  Musculoskeletal:  Positive for joint pain, joint pain, myalgias, morning stiffness, muscle tenderness and myalgias. Negative for joint swelling.  Skin:  Negative for color change, rash, redness and sensitivity to sunlight.  Allergic/Immunologic: Negative for susceptible to infections.  Neurological:  Positive for dizziness, numbness and weakness. Negative for headaches and memory loss.  Hematological:  Positive for bruising/bleeding tendency. Negative for swollen glands.  Psychiatric/Behavioral:  Negative for confusion.    PMFS History:  Patient Active Problem List   Diagnosis Date Noted   Class 2 severe obesity due to excess calories with serious comorbidity and body mass index (BMI) of 35.0 to 35.9 in adult ([place]) 09/14/2020   Greater trochanteric bursitis of left hip 08/30/2020   Left knee pain 08/30/2020   Elevated uric acid in blood 05/02/2020   Chronic pain of right knee 04/13/2020   Mixed dyslipidemia 02/22/2020   Arthritis    Dysplastic nevus    Hypertension    Palpitations 10/19/2019   Cardiac murmur 10/19/2019   Lateral epicondylitis of both elbows 08/06/2018   Concern about female breast disease without diagnosis 03/17/2018   Essential hypertension 02/26/2017   Exercise-induced asthma    DOE (dyspnea on exertion) 08/08/2015   Elevated BP without diagnosis of hypertension 06/15/2015   Weight gain 04/24/2015   Primary localized osteoarthritis of right knee 10/13/2014   Tear of medial meniscus of right knee 10/13/2014   Squamous cell carcinoma, face 02/24/2014   Vitamin D deficiency 02/24/2014   Edema of extremities  07/07/2013   Fatigue 07/06/2013   GERD (gastroesophageal reflux disease) 07/06/2013   Myalgia 07/06/2013   Vitamin B12 deficiency 05/15/2013   BMI 35.0-35.9,adult 05/15/2013   Perimenopausal 05/15/2013   Low TSH level 04/22/2013   Breast cancer screening, high risk patient 04/04/2013   Preventative health care 04/04/2013    Periodic edema 04/04/2013   Other and unspecified hyperlipidemia 04/06/2011    Past Medical History:  Diagnosis Date   Arthritis    both knees , back and elbows   BMI 35.0-35.9,adult 05/15/2013   Breast cancer screening, high risk patient 04/04/2013   Pt states she had mmg 2015 nml results, ordered by Dr  Ruben Gottron, [facility] health care. Results are not in Care everywhere. Mammogram completed 06/12/2015 at [facility] in [place], [place] Category 1    Cardiac murmur 10/19/2019   Concern about female breast disease without diagnosis 03/17/2018   DOE (dyspnea on exertion) 08/08/2015   Dysplastic nevus    beneath r eye   Edema of extremities 07/07/2013   Last Assessment & Plan:  Pt takes HCTZ daily.   Last Assessment & Plan:  Formatting of this note might be different from the original. Pt takes HCTZ daily.   Essential hypertension 02/26/2017   Exercise-induced asthma    Fatigue 07/06/2013   Last Assessment & Plan:  Will review previous records from previous PCP, pt had thyroid done recently and was wnl. Pt had questions re lupus/MS and how to get tested for these.   Last Assessment & Plan:  Formatting of this note might be different from the original. Will review previous records from previous PCP, pt had thyroid done recently and was wnl. Pt had questions re lupus/MS and how to get    GERD (gastroesophageal reflux disease)    Lateral epicondylitis of both elbows 08/06/2018   Low TSH level 04/22/2013   Myalgia 07/06/2013   Last Assessment & Plan:  Formatting of this note might be different from the original. Will check labs today including sed rate, ANA/RF.  Last Assessment & Plan:  Formatting of this note might be different from the original. Pt would like to be tested for lupus in the future. Has been tested for RA and was negative   Other and unspecified hyperlipidemia 04/06/2011   Perimenopausal 05/15/2013   Periodic edema  04/04/2013   Pt reports taking "water pill" for many years due to feeling "swollen". Historically, took HCTZ but became hypokalemic. She was switched to triamterene-HCTZ with no more hypokalemic episodes.     Primary localized osteoarthritis of right knee 10/13/2014   Squamous cell carcinoma, face 02/24/2014   Tear of medial meniscus of right knee 10/13/2014   Vitamin B12 deficiency 05/15/2013   Last Assessment & Plan:  Formatting of this note might be different from the original. Recheck level today, may need to restart monthly Vit B12 shots   Vitamin D deficiency 02/24/2014   Weight gain 04/24/2015    Family History  Problem Relation Age of Onset   Hyperlipidemia Mother    Breast cancer Mother 33       breast   Heart attack Father    COPD Father    Heart attack Sister    Hyperlipidemia Sister    Hypertension Sister    Hyperlipidemia Maternal Aunt    Breast cancer Maternal Aunt        breast   Hypertension Maternal Uncle    Diabetes Maternal Uncle  Colon cancer Maternal Uncle        colon   Hyperlipidemia Maternal Grandmother    Diabetes Maternal Grandmother    Breast cancer Maternal Grandmother        breast   Diabetes Maternal Grandfather    Heart attack Sister    Sudden death Neg Hx    Past Surgical History:  Procedure Laterality Date   ABDOMINAL HYSTERECTOMY  2007   KNEE ARTHROSCOPY WITH MEDIAL MENISECTOMY Right 10/13/2014   Procedure: RIGHT KNEE ARTHROSCOPY WITH PARTIAL MEDIAL MENISECTOMY AND CHONDROPLASTY ;  Surgeon: Marchia Bond, MD;  Location: [facility];  Service: Orthopedics;  Laterality: Right;   Social History   Social History Narrative   Married to [place].    Immunization History  Administered Date(s) Administered   Tdap 02/15/2008     Objective: Vital Signs: BP 126/90 (BP Location: Left Arm, Patient Position: Sitting, Cuff Size: Normal)   Pulse (!) 59   Ht 5' 1.5" (1.562 m)   Wt 191 lb 12.8 oz (87 kg)   BMI 35.65 kg/m    Physical  Exam Vitals and nursing note reviewed.  Constitutional:      Appearance: She is well-developed.  HENT:     Head: Normocephalic and atraumatic.  Eyes:     Conjunctiva/sclera: Conjunctivae normal.  Cardiovascular:     Rate and Rhythm: Normal rate and regular rhythm.     Heart sounds: Normal heart sounds.  Pulmonary:     Effort: Pulmonary effort is normal.     Breath sounds: Normal breath sounds.  Abdominal:     General: Bowel sounds are normal.     Palpations: Abdomen is soft.  Musculoskeletal:     Cervical back: Normal range of motion.  Lymphadenopathy:     Cervical: No cervical adenopathy.  Skin:    General: Skin is warm and dry.     Capillary Refill: Capillary refill takes less than 2 seconds.  Neurological:     Mental Status: She is alert and oriented to person, place, and time.  Psychiatric:        Behavior: Behavior normal.     Musculoskeletal Exam: C-spine thoracic and lumbar spine were in good range of motion.  Shoulder joints, elbow joints, wrist joints with good range of motion.  She had bilateral DIP thickening with no synovitis.  Hip joints with good range of motion.  She had tenderness over left trochanteric bursa.  She had crepitus with range of motion of bilateral knee joints without any warmth swelling or effusion.  There was no tenderness over ankles or MTP joints.  CDAI Exam: CDAI Score: -- Patient Global: --; Provider Global: -- Swollen: --; Tender: -- Joint Exam 10/09/2020   No joint exam has been documented for this visit   There is currently no information documented on the homunculus. Go to the Rheumatology activity and complete the homunculus joint exam.  Investigation: No additional findings.  Imaging: No results found.  Recent Labs: Lab Results  Component Value Date   WBC 6.0 09/14/2019   HGB 12.6 09/14/2019   PLT 171.0 09/14/2019   NA 140 03/07/2020   K 3.7 03/07/2020   CL 104 03/07/2020   CO2 23 03/07/2020   GLUCOSE 85 03/07/2020    BUN 12 03/07/2020   CREATININE 0.78 03/07/2020   BILITOT 0.4 03/07/2020   ALKPHOS 81 03/07/2020   AST 14 03/07/2020   ALT 10 03/07/2020   PROT 6.3 03/07/2020   ALBUMIN 4.2 03/07/2020   CALCIUM 9.1  03/07/2020   GFRAA 99 03/07/2020    Speciality Comments: No specialty comments available.  Procedures:  No procedures performed Allergies: Penicillins   Assessment / Plan:     Visit Diagnoses:   Primary osteoarthritis of both hands - X-ray of bilateral hands were consistent with osteoarthritis.  She had bilateral DIP thickening consistent with osteoarthritis.  Joint protection muscle strengthening was discussed.  Chronic pain of both shoulders -she complains of off-and-on discomfort in her shoulders.  No warmth swelling or effusion was noted.  X-ray of bilateral shoulders were unremarkable.    Trochanteric bursitis, left hip-she continues to have pain and discomfort in her left trochanteric bursa.Patient states that she had cortisone injection for the left trochanteric bursitis by Dr. Tamala Julian recently.  A handout on trochanteric bursitis was given.  Some of the IT band stretches were demonstrated in the office.  I offered physical therapy but she declined.  She states she will try exercises at home first.  Primary osteoarthritis of both knees -she has been experiencing increased pain and discomfort in her knee joints.I reviewed her x-rays done by Dr. Tamala Julian from April 2022.  Right knee joint showed moderate osteoarthritis and left knee joint showed moderate osteoarthritis. Bilateral knee joint showed severe chondromalacia patella.  Lower extremity muscle strengthening exercises were discussed.  A handout on exercises was given.  She is also had viscosupplementation in her right knee joint by Dr. Tamala Julian.  Patient had minimal response to it.  I offered physical therapy which she declined.  Pain in right foot - X-ray of the foot was unremarkable except for mild osteoarthritic changes.  She has some  dorsal spurring and pes cavus.   Arthropathy of lumbar facet joint-she continues to have some lower back discomfort.  Lateral epicondylitis of both elbows -resolved.  Positive ANA (antinuclear antibody) - ANA 1: 40NS not a significant titer.  ENA completely negative, complements normal.  She denies any history of oral ulcers, nasal ulcers, malar rash, Raynaud's phenomenon, photosensitivity or lymphadenopathy.  There is not evidence of synovitis on my examination.  Squamous cell carcinoma, face  Essential hypertension-diastolic pressure was elevated today. I advised her to monitor blood pressure closely.  Exercise-induced asthma  Vitamin B12 deficiency without anemia  Vitamin D deficiency  History of gastroesophageal reflux (GERD)  Family history of rheumatoid arthritis  Orders: No orders of the defined types were placed in this encounter.  No orders of the defined types were placed in this encounter.    Follow-Up Instructions: Return in about 1 year (around 10/09/2021) for Osteoarthritis.   Bo Merino, MD  Note - This record has been created using Editor, commissioning.  Chart creation errors have been sought, but may not always  have been located. Such creation errors do not reflect on  the standard of medical care.

## 2020-09-27 ENCOUNTER — Other Ambulatory Visit (HOSPITAL_BASED_OUTPATIENT_CLINIC_OR_DEPARTMENT_OTHER): Payer: Self-pay

## 2020-09-28 ENCOUNTER — Other Ambulatory Visit (HOSPITAL_BASED_OUTPATIENT_CLINIC_OR_DEPARTMENT_OTHER): Payer: Self-pay

## 2020-09-28 ENCOUNTER — Telehealth: Payer: Self-pay | Admitting: Family Medicine

## 2020-09-28 NOTE — Telephone Encounter (Signed)
Initiated PA for Ozempic  KEY:  [id number] Medication denied.

## 2020-10-02 NOTE — Telephone Encounter (Signed)
Received denial PCP aware and sent in alternative Patient is aware.

## 2020-10-03 ENCOUNTER — Ambulatory Visit: Payer: 59 | Admitting: Rheumatology

## 2020-10-03 DIAGNOSIS — M79671 Pain in right foot: Secondary | ICD-10-CM

## 2020-10-03 DIAGNOSIS — C44320 Squamous cell carcinoma of skin of unspecified parts of face: Secondary | ICD-10-CM

## 2020-10-03 DIAGNOSIS — J45990 Exercise induced bronchospasm: Secondary | ICD-10-CM

## 2020-10-03 DIAGNOSIS — M2550 Pain in unspecified joint: Secondary | ICD-10-CM

## 2020-10-03 DIAGNOSIS — G8929 Other chronic pain: Secondary | ICD-10-CM

## 2020-10-03 DIAGNOSIS — R768 Other specified abnormal immunological findings in serum: Secondary | ICD-10-CM

## 2020-10-03 DIAGNOSIS — Z8719 Personal history of other diseases of the digestive system: Secondary | ICD-10-CM

## 2020-10-03 DIAGNOSIS — Z8261 Family history of arthritis: Secondary | ICD-10-CM

## 2020-10-03 DIAGNOSIS — M7711 Lateral epicondylitis, right elbow: Secondary | ICD-10-CM

## 2020-10-03 DIAGNOSIS — E559 Vitamin D deficiency, unspecified: Secondary | ICD-10-CM

## 2020-10-03 DIAGNOSIS — M19041 Primary osteoarthritis, right hand: Secondary | ICD-10-CM

## 2020-10-03 DIAGNOSIS — M7062 Trochanteric bursitis, left hip: Secondary | ICD-10-CM

## 2020-10-03 DIAGNOSIS — M47816 Spondylosis without myelopathy or radiculopathy, lumbar region: Secondary | ICD-10-CM

## 2020-10-03 DIAGNOSIS — M170 Bilateral primary osteoarthritis of knee: Secondary | ICD-10-CM

## 2020-10-03 DIAGNOSIS — E538 Deficiency of other specified B group vitamins: Secondary | ICD-10-CM

## 2020-10-03 DIAGNOSIS — I10 Essential (primary) hypertension: Secondary | ICD-10-CM

## 2020-10-08 ENCOUNTER — Other Ambulatory Visit (HOSPITAL_BASED_OUTPATIENT_CLINIC_OR_DEPARTMENT_OTHER): Payer: Self-pay

## 2020-10-09 ENCOUNTER — Other Ambulatory Visit: Payer: Self-pay

## 2020-10-09 ENCOUNTER — Encounter: Payer: Self-pay | Admitting: Rheumatology

## 2020-10-09 ENCOUNTER — Ambulatory Visit: Payer: 59 | Admitting: Rheumatology

## 2020-10-09 VITALS — BP 126/90 | HR 59 | Ht 61.5 in | Wt 191.8 lb

## 2020-10-09 DIAGNOSIS — M19041 Primary osteoarthritis, right hand: Secondary | ICD-10-CM | POA: Diagnosis not present

## 2020-10-09 DIAGNOSIS — M25511 Pain in right shoulder: Secondary | ICD-10-CM | POA: Diagnosis not present

## 2020-10-09 DIAGNOSIS — I10 Essential (primary) hypertension: Secondary | ICD-10-CM

## 2020-10-09 DIAGNOSIS — E559 Vitamin D deficiency, unspecified: Secondary | ICD-10-CM

## 2020-10-09 DIAGNOSIS — M2550 Pain in unspecified joint: Secondary | ICD-10-CM

## 2020-10-09 DIAGNOSIS — M79671 Pain in right foot: Secondary | ICD-10-CM

## 2020-10-09 DIAGNOSIS — M7062 Trochanteric bursitis, left hip: Secondary | ICD-10-CM

## 2020-10-09 DIAGNOSIS — M47816 Spondylosis without myelopathy or radiculopathy, lumbar region: Secondary | ICD-10-CM

## 2020-10-09 DIAGNOSIS — Z8719 Personal history of other diseases of the digestive system: Secondary | ICD-10-CM

## 2020-10-09 DIAGNOSIS — G8929 Other chronic pain: Secondary | ICD-10-CM

## 2020-10-09 DIAGNOSIS — J45990 Exercise induced bronchospasm: Secondary | ICD-10-CM

## 2020-10-09 DIAGNOSIS — M170 Bilateral primary osteoarthritis of knee: Secondary | ICD-10-CM

## 2020-10-09 DIAGNOSIS — E538 Deficiency of other specified B group vitamins: Secondary | ICD-10-CM

## 2020-10-09 DIAGNOSIS — Z8261 Family history of arthritis: Secondary | ICD-10-CM

## 2020-10-09 DIAGNOSIS — M25512 Pain in left shoulder: Secondary | ICD-10-CM

## 2020-10-09 DIAGNOSIS — C44320 Squamous cell carcinoma of skin of unspecified parts of face: Secondary | ICD-10-CM

## 2020-10-09 DIAGNOSIS — M19042 Primary osteoarthritis, left hand: Secondary | ICD-10-CM

## 2020-10-09 DIAGNOSIS — R768 Other specified abnormal immunological findings in serum: Secondary | ICD-10-CM

## 2020-10-09 DIAGNOSIS — M7711 Lateral epicondylitis, right elbow: Secondary | ICD-10-CM

## 2020-10-09 NOTE — Patient Instructions (Signed)
Knee Exercises Ask your health care provider which exercises are safe for you. Do exercises exactly as told by your health care provider and adjust them as directed. It is normal to feel mild stretching, pulling, tightness, or discomfort as you do these exercises. Stop right away if you feel sudden pain or your pain gets worse. Do not begin these exercises until told by your health care provider. Stretching and range-of-motion exercises These exercises warm up your muscles and joints and improve the movement and flexibility of your knee. These exercises also help to relieve pain and swelling. Knee extension, prone Lie on your abdomen (prone position) on a bed. Place your left / right knee just beyond the edge of the surface so your knee is not on the bed. You can put a towel under your left / right thigh just above your kneecap for comfort. Relax your leg muscles and allow gravity to straighten your knee (extension). You should feel a stretch behind your left / right knee. Hold this position for __________ seconds. Scoot up so your knee is supported between repetitions. Repeat __________ times. Complete this exercise __________ times a day. Knee flexion, active  Lie on your back with both legs straight. If this causes back discomfort, bend your left / right knee so your foot is flat on the floor. Slowly slide your left / right heel back toward your buttocks. Stop when you feel a gentle stretch in the front of your knee or thigh (flexion). Hold this position for __________ seconds. Slowly slide your left / right heel back to the starting position. Repeat __________ times. Complete this exercise __________ times a day. Quadriceps stretch, prone  Lie on your abdomen on a firm surface, such as a bed or padded floor. Bend your left / right knee and hold your ankle. If you cannot reach your ankle or pant leg, loop a belt around your foot and grab the belt instead. Gently pull your heel toward your  buttocks. Your knee should not slide out to the side. You should feel a stretch in the front of your thigh and knee (quadriceps). Hold this position for __________ seconds. Repeat __________ times. Complete this exercise __________ times a day. Hamstring, supine Lie on your back (supine position). Loop a belt or towel over the ball of your left / right foot. The ball of your foot is on the walking surface, right under your toes. Straighten your left / right knee and slowly pull on the belt to raise your leg until you feel a gentle stretch behind your knee (hamstring). Do not let your knee bend while you do this. Keep your other leg flat on the floor. Hold this position for __________ seconds. Repeat __________ times. Complete this exercise __________ times a day. Strengthening exercises These exercises build strength and endurance in your knee. Endurance is the ability to use your muscles for a long time, even after they get tired. Quadriceps, isometric This exercise stretches the muscles in front of your thigh (quadriceps) without moving your knee joint (isometric). Lie on your back with your left / right leg extended and your other knee bent. Put a rolled towel or small pillow under your knee if told by your health care provider. Slowly tense the muscles in the front of your left / right thigh. You should see your kneecap slide up toward your hip or see increased dimpling just above the knee. This motion will push the back of the knee toward the floor. For __________   seconds, hold the muscle as tight as you can without increasing your pain. Relax the muscles slowly and completely. Repeat __________ times. Complete this exercise __________ times a day. Straight leg raises This exercise stretches the muscles in front of your thigh (quadriceps) and the muscles that move your hips (hip flexors). Lie on your back with your left / right leg extended and your other knee bent. Tense the muscles in  the front of your left / right thigh. You should see your kneecap slide up or see increased dimpling just above the knee. Your thigh may even shake a bit. Keep these muscles tight as you raise your leg 4-6 inches (10-15 cm) off the floor. Do not let your knee bend. Hold this position for __________ seconds. Keep these muscles tense as you lower your leg. Relax your muscles slowly and completely after each repetition. Repeat __________ times. Complete this exercise __________ times a day. Hamstring, isometric Lie on your back on a firm surface. Bend your left / right knee about __________ degrees. Dig your left / right heel into the surface as if you are trying to pull it toward your buttocks. Tighten the muscles in the back of your thighs (hamstring) to "dig" as hard as you can without increasing any pain. Hold this position for __________ seconds. Release the tension gradually and allow your muscles to relax completely for __________ seconds after each repetition. Repeat __________ times. Complete this exercise __________ times a day. Hamstring curls If told by your health care provider, do this exercise while wearing ankle weights. Begin with __________ lb weights. Then increase the weight by 1 lb (0.5 kg) increments. Do not wear ankle weights that are more than __________ lb. Lie on your abdomen with your legs straight. Bend your left / right knee as far as you can without feeling pain. Keep your hips flat against the floor. Hold this position for __________ seconds. Slowly lower your leg to the starting position. Repeat __________ times. Complete this exercise __________ times a day. Squats This exercise strengthens the muscles in front of your thigh and knee (quadriceps). Stand in front of a table, with your feet and knees pointing straight ahead. You may rest your hands on the table for balance but not for support. Slowly bend your knees and lower your hips like you are going to sit in a  chair. Keep your weight over your heels, not over your toes. Keep your lower legs upright so they are parallel with the table legs. Do not let your hips go lower than your knees. Do not bend lower than told by your health care provider. If your knee pain increases, do not bend as low. Hold the squat position for __________ seconds. Slowly push with your legs to return to standing. Do not use your hands to pull yourself to standing. Repeat __________ times. Complete this exercise __________ times a day. Wall slides This exercise strengthens the muscles in front of your thigh and knee (quadriceps). Lean your back against a smooth wall or door, and walk your feet out 18-24 inches (46-61 cm) from it. Place your feet hip-width apart. Slowly slide down the wall or door until your knees bend __________ degrees. Keep your knees over your heels, not over your toes. Keep your knees in line with your hips. Hold this position for __________ seconds. Repeat __________ times. Complete this exercise __________ times a day. Straight leg raises This exercise strengthens the muscles that rotate the leg at the hip and   move it away from your body (hip abductors). Lie on your side with your left / right leg in the top position. Lie so your head, shoulder, knee, and hip line up. You may bend your bottom knee to help you keep your balance. Roll your hips slightly forward so your hips are stacked directly over each other and your left / right knee is facing forward. Leading with your heel, lift your top leg 4-6 inches (10-15 cm). You should feel the muscles in your outer hip lifting. Do not let your foot drift forward. Do not let your knee roll toward the ceiling. Hold this position for __________ seconds. Slowly return your leg to the starting position. Let your muscles relax completely after each repetition. Repeat __________ times. Complete this exercise __________ times a day. Straight leg raises This  exercise stretches the muscles that move your hips away from the front of the pelvis (hip extensors). Lie on your abdomen on a firm surface. You can put a pillow under your hips if that is more comfortable. Tense the muscles in your buttocks and lift your left / right leg about 4-6 inches (10-15 cm). Keep your knee straight as you lift your leg. Hold this position for __________ seconds. Slowly lower your leg to the starting position. Let your leg relax completely after each repetition. Repeat __________ times. Complete this exercise __________ times a day. This information is not intended to replace advice given to you by your health care provider. Make sure you discuss any questions you have with your health care provider. Document Revised: 11/03/2017 Document Reviewed: 11/03/2017 Elsevier Patient Education  2022 [place] [facility] Ask your health care provider which exercises are safe for you. Do exercises exactly as told by your health care provider and adjust them as directed. It is normal to feel mild stretching, pulling, tightness, or discomfort as you do these exercises. Stop right away if you feel sudden pain or your pain gets significantly worse. Do not begin these exercises until told by your health care provider. Stretching and range-of-motion exercises These exercises warm up your muscles and joints and improve the movement and flexibility of your hip and pelvis. Quadriceps stretch, prone  Lie on your abdomen (prone position) on a firm surface, such as a bed or padded floor. Bend your left / right knee and reach back to hold your ankle or pant leg. If you cannot reach your ankle or pant leg, loop a belt around your foot and grab the belt instead. Gently pull your heel toward your buttocks. Your knee should not slide out to the side. You should feel a stretch in the front of your thigh and knee (quadriceps). Hold this position for __________ seconds. Repeat  __________ times. Complete this exercise __________ times a day. Iliotibial band stretch An iliotibial band is a strong band of muscle tissue that runs from the outer side of your hip to the outer side of your thigh and knee. Lie on your side with your left / right leg in the top position. Bend both of your knees and grab your left / right ankle. Stretch out your bottom arm to help you balance. Slowly bring your top knee back so your thigh goes behind your trunk. Slowly lower your top leg toward the floor until you feel a gentle stretch on the outside of your left / right hip and thigh. If you do not feel a stretch and your knee will not fall farther, place the  heel of your other foot on top of your knee and pull your knee down toward the floor with your foot. Hold this position for __________ seconds. Repeat __________ times. Complete this exercise __________ times a day. Strengthening exercises These exercises build strength and endurance in your hip and pelvis. Endurance is the ability to use your muscles for a long time, even after they get tired. Straight leg raises, side-lying This exercise strengthens the muscles that rotate the leg at the hip and move it away from your body (hip abductors). Lie on your side with your left / right leg in the top position. Lie so your head, shoulder, hip, and knee line up. You may bend your bottom knee to help you balance. Roll your hips slightly forward so your hips are stacked directly over each other and your left / right knee is facing forward. Tense the muscles in your outer thigh and lift your top leg 4-6 inches (10-15 cm). Hold this position for __________ seconds. Slowly lower your leg to return to the starting position. Let your muscles relax completely before doing another repetition. Repeat __________ times. Complete this exercise __________ times a day. Leg raises, prone This exercise strengthens the muscles that move the hips backward (hip  extensors). Lie on your abdomen (prone position) on your bed or a firm surface. You can put a pillow under your hips if that is more comfortable for your lower back. Bend your left / right knee so your foot is straight up in the air. Squeeze your buttocks muscles and lift your left / right thigh off the bed. Do not let your back arch. Tense your thigh muscle as hard as you can without increasing any knee pain. Hold this position for __________ seconds. Slowly lower your leg to return to the starting position and allow it to relax completely. Repeat __________ times. Complete this exercise __________ times a day. Hip hike Stand sideways on a bottom step. Stand on your left / right leg with your other foot unsupported next to the step. You can hold on to a railing or wall for balance if needed. Keep your knees straight and your torso square. Then lift your left / right hip up toward the ceiling. Slowly let your left / right hip lower toward the floor, past the starting position. Your foot should get closer to the floor. Do not lean or bend your knees. Repeat __________ times. Complete this exercise __________ times a day. This information is not intended to replace advice given to you by your health care provider. Make sure you discuss any questions you have with your health care provider. Document Revised: 03/23/2019 Document Reviewed: 03/23/2019 Elsevier Patient Education  [place].

## 2020-10-11 NOTE — Progress Notes (Signed)
[place] Phone: [phone] Subjective:   Stephanie Castro, am serving as a scribe for Dr. Hulan Saas. This visit occurred during the SARS-CoV-2 public health emergency.  Safety protocols were in place, including screening questions prior to the visit, additional usage of staff PPE, and extensive cleaning of exam room while observing appropriate contact time as indicated for disinfecting solutions.   I'm seeing this patient by the request  of:  [name], DO  CC: Left hip pain, multiple other complaints follow-up  [id number]  08/30/2020 Patient did have a fall initially that she thinks could have contributed to some of the pain and we discussed the potential for MRI with patient's x-rays being fairly unremarkable.  Patient unfortunately does have what appears to be developed more of a gout flare that I think is contributing to her today.  Patient told to double the tart cherry at the moment.  Patient declined allopurinol and will ask primary care if we can change the possibility of the HCTZ  Placed patient on a once weekly vitamin D to try to help her with some of the potential for the gout flare as well as help with muscle strengthening.  Patient is of elevated uric acid and patient did have an flare.  We discussed Reviewed, home exercises, increase activity slowly.  Follow-up with me again in 6 weeks.  Will have primary care switched from HCTZ to another blood pressure medicine.  Patient will increase due to her cherry extract but patient does not want to start the allopurinol at this time.  Patient given injection today and tolerated procedure well.  Discussed icing regimen and home exercises.  Patient will get x-rays to further evaluate the amount of arthritic changes that could be contributing as well.  Discussed icing regimen.  Discussed continued core strengthening proper shoes.  Follow-up again in  6 to 8 weeks.  Updated 10/16/2020 Stephanie Castro is a 55 y.o. female coming in with complaint of L hip is moderately better. B knee pain is constant. Patient is trying to lose weight but feels she is unable to do so.  Patient is frustrated with this.  States that she is unable to workout on a regular basis secondary to the discomfort and pain.  Xray IMPRESSION: 08/30/2020 Minimal degenerative changes of the LEFT hip joint.      Past Medical History:  Diagnosis Date   Arthritis    both knees , back and elbows   BMI 35.0-35.9,adult 05/15/2013   Breast cancer screening, high risk patient 04/04/2013   Pt states she had mmg 2015 nml results, ordered by Dr  Ruben Gottron, [facility] health care. Results are not in Care everywhere. Mammogram completed 06/12/2015 at [facility] in [place], [place] Category 1    Cardiac murmur 10/19/2019   Concern about female breast disease without diagnosis 03/17/2018   DOE (dyspnea on exertion) 08/08/2015   Dysplastic nevus    beneath r eye   Edema of extremities 07/07/2013   Last Assessment & Plan:  Pt takes HCTZ daily.   Last Assessment & Plan:  Formatting of this note might be different from the original. Pt takes HCTZ daily.   Essential hypertension 02/26/2017   Exercise-induced asthma    Fatigue 07/06/2013   Last Assessment & Plan:  Will review previous records from previous PCP, pt had thyroid done recently and was wnl. Pt had  questions re lupus/MS and how to get tested for these.   Last Assessment & Plan:  Formatting of this note might be different from the original. Will review previous records from previous PCP, pt had thyroid done recently and was wnl. Pt had questions re lupus/MS and how to get    GERD (gastroesophageal reflux disease)    Lateral epicondylitis of both elbows 08/06/2018   Low TSH level 04/22/2013   Myalgia 07/06/2013   Last Assessment & Plan:  Formatting of this note might be  different from the original. Will check labs today including sed rate, ANA/RF.  Last Assessment & Plan:  Formatting of this note might be different from the original. Pt would like to be tested for lupus in the future. Has been tested for RA and was negative   Other and unspecified hyperlipidemia 04/06/2011   Perimenopausal 05/15/2013   Periodic edema 04/04/2013   Pt reports taking "water pill" for many years due to feeling "swollen". Historically, took HCTZ but became hypokalemic. She was switched to triamterene-HCTZ with Castro more hypokalemic episodes.     Primary localized osteoarthritis of right knee 10/13/2014   Squamous cell carcinoma, face 02/24/2014   Tear of medial meniscus of right knee 10/13/2014   Vitamin B12 deficiency 05/15/2013   Last Assessment & Plan:  Formatting of this note might be different from the original. Recheck level today, may need to restart monthly Vit B12 shots   Vitamin D deficiency 02/24/2014   Weight gain 04/24/2015   Past Surgical History:  Procedure Laterality Date   ABDOMINAL HYSTERECTOMY  2007   KNEE ARTHROSCOPY WITH MEDIAL MENISECTOMY Right 10/13/2014   Procedure: RIGHT KNEE ARTHROSCOPY WITH PARTIAL MEDIAL MENISECTOMY AND CHONDROPLASTY ;  Surgeon: Marchia Bond, MD;  Location: [facility];  Service: Orthopedics;  Laterality: Right;   Social History   Socioeconomic History   Marital status: Married    Spouse name: Not on file   Number of children: Not on file   Years of education: Not on file   Highest education level: Not on file  Occupational History   Not on file  Tobacco Use   Smoking status: Former    Packs/day: 1.00    Years: 20.00    Pack years: 20.00    Types: Cigarettes    Quit date: 2005    Years since quitting: 17.7   Smokeless tobacco: Never  Vaping Use   Vaping Use: Never used  Substance and Sexual Activity   Alcohol use: Yes    Alcohol/week: 0.0 standard drinks    Comment: social   Drug use: Castro   Sexual activity: Yes     Birth control/protection: Surgical  Other Topics Concern   Not on file  Social History Narrative   Married to [place].    Social Determinants of Health   Financial Resource Strain: Not on file  Food Insecurity: Not on file  Transportation Needs: Not on file  Physical Activity: Not on file  Stress: Not on file  Social Connections: Not on file   Allergies  Allergen Reactions   Penicillins    Family History  Problem Relation Age of Onset   Hyperlipidemia Mother    Breast cancer Mother 14       breast   Heart attack Father    COPD Father    Heart attack Sister    Hyperlipidemia Sister    Hypertension Sister    Hyperlipidemia Maternal Aunt    Breast cancer Maternal Aunt  breast   Hypertension Maternal Uncle    Diabetes Maternal Uncle    Colon cancer Maternal Uncle        colon   Hyperlipidemia Maternal Grandmother    Diabetes Maternal Grandmother    Breast cancer Maternal Grandmother        breast   Diabetes Maternal Grandfather    Heart attack Sister    Sudden death Neg Hx      Current Outpatient Medications (Cardiovascular):    olmesartan (BENICAR) 20 MG tablet, Take 0.5 tablets (10 mg total) by mouth daily.   rosuvastatin (CRESTOR) 10 MG tablet, TAKE 1 TABLET BY MOUTH ONCE DAILY   Current Outpatient Medications (Analgesics):    aspirin EC 81 MG tablet, Take 1 tablet (81 mg total) by mouth daily. Swallow whole.   meloxicam (MOBIC) 7.5 MG tablet, TAKE 1 TABLET BY MOUTH ONCE DAILY  Current Outpatient Medications (Hematological):    cyanocobalamin (,VITAMIN B-12,) 1000 MCG/ML injection, INJECT 1ML INTO THE SKIN EVERY 2 WEEKS  Current Outpatient Medications (Other):    Diclofenac Sodium (PENNSAID) 2 % SOLN, Place 1 application onto the skin 2 (two) times daily.   [START ON 10/26/2020] phentermine (ADIPEX-P) 37.5 MG tablet, Take 1 tablet (37.5 mg total) by mouth daily before breakfast.   [START ON 11/26/2020] phentermine (ADIPEX-P) 37.5 MG tablet, Take 1  tablet (37.5 mg total) by mouth daily before breakfast.   [START ON 12/25/2020] phentermine (ADIPEX-P) 37.5 MG tablet, Take 1 tablet (37.5 mg total) by mouth daily before breakfast.   Vitamin D, Ergocalciferol, (DRISDOL) 1.25 MG (50000 UNIT) CAPS capsule, Take 1 capsule (50,000 Units total) by mouth every 7 (seven) days.   Reviewed prior external information including notes and imaging from  primary care provider As well as notes that were available from care everywhere and other healthcare systems.  Past medical history, social, surgical and family history all reviewed in electronic medical record.  Castro pertanent information unless stated regarding to the chief complaint.   Review of Systems:  Castro headache, visual changes, nausea, vomiting, diarrhea, constipation, dizziness, abdominal pain, skin rash, fevers, chills, night sweats, weight loss, swollen lymph nodes,  joint swelling, chest pain, shortness of breath, mood changes. POSITIVE muscle aches, body aches  Objective  Blood pressure 110/70, pulse (!) 56, height '5\' 2"'$  (1.575 m), weight 194 lb (88 kg), SpO2 98 %.   General: Castro apparent distress alert and oriented x3 mood and affect normal, dressed appropriately.  HEENT: Pupils equal, extraocular movements intact  Respiratory: Patient's speak in full sentences and does not appear short of breath  Cardiovascular: Castro lower extremity edema, non tender, Castro erythema  Gait normal with good balance and coordination.  MSK: Left hip exam shows patient still has some tightness noted with internal and external range of motion.  Minorly tender over the lateral aspect of the hip.  Continues to have bilateral knee pain with lateral tracking of the patella noted.    Impression and Recommendations:    The above documentation has been reviewed and is accurate and complete Lyndal Pulley, DO

## 2020-10-15 ENCOUNTER — Other Ambulatory Visit: Payer: Self-pay | Admitting: Family Medicine

## 2020-10-15 ENCOUNTER — Ambulatory Visit (INDEPENDENT_AMBULATORY_CARE_PROVIDER_SITE_OTHER): Payer: 59 | Admitting: Family Medicine

## 2020-10-15 ENCOUNTER — Other Ambulatory Visit: Payer: Self-pay

## 2020-10-15 ENCOUNTER — Encounter: Payer: Self-pay | Admitting: Family Medicine

## 2020-10-15 ENCOUNTER — Other Ambulatory Visit (HOSPITAL_BASED_OUTPATIENT_CLINIC_OR_DEPARTMENT_OTHER): Payer: Self-pay

## 2020-10-15 VITALS — BP 108/74 | HR 57 | Temp 97.8°F | Ht 62.0 in | Wt 191.0 lb

## 2020-10-15 DIAGNOSIS — E669 Obesity, unspecified: Secondary | ICD-10-CM

## 2020-10-15 DIAGNOSIS — G8929 Other chronic pain: Secondary | ICD-10-CM

## 2020-10-15 DIAGNOSIS — Z5320 Procedure and treatment not carried out because of patient's decision for unspecified reasons: Secondary | ICD-10-CM

## 2020-10-15 DIAGNOSIS — Z1211 Encounter for screening for malignant neoplasm of colon: Secondary | ICD-10-CM

## 2020-10-15 DIAGNOSIS — Z1159 Encounter for screening for other viral diseases: Secondary | ICD-10-CM | POA: Diagnosis not present

## 2020-10-15 DIAGNOSIS — Z2821 Immunization not carried out because of patient refusal: Secondary | ICD-10-CM

## 2020-10-15 DIAGNOSIS — Z2882 Immunization not carried out because of caregiver refusal: Secondary | ICD-10-CM

## 2020-10-15 DIAGNOSIS — Z0000 Encounter for general adult medical examination without abnormal findings: Secondary | ICD-10-CM | POA: Diagnosis not present

## 2020-10-15 LAB — CBC
HCT: 38.5 % (ref 36.0–46.0)
Hemoglobin: 12.8 g/dL (ref 12.0–15.0)
MCHC: 33.1 g/dL (ref 30.0–36.0)
MCV: 91.5 fl (ref 78.0–100.0)
Platelets: 175 10*3/uL (ref 150.0–400.0)
RBC: 4.21 Mil/uL (ref 3.87–5.11)
RDW: 14.2 % (ref 11.5–15.5)
WBC: 5.5 10*3/uL (ref 4.0–10.5)

## 2020-10-15 LAB — VITAMIN B12: Vitamin B-12: 145 pg/mL — ABNORMAL LOW (ref 211–911)

## 2020-10-15 LAB — COMPREHENSIVE METABOLIC PANEL
ALT: 16 U/L (ref 0–35)
AST: 22 U/L (ref 0–37)
Albumin: 4.1 g/dL (ref 3.5–5.2)
Alkaline Phosphatase: 66 U/L (ref 39–117)
BUN: 16 mg/dL (ref 6–23)
CO2: 26 mEq/L (ref 19–32)
Calcium: 9.1 mg/dL (ref 8.4–10.5)
Chloride: 107 mEq/L (ref 96–112)
Creatinine, Ser: 0.76 mg/dL (ref 0.40–1.20)
GFR: 87.68 mL/min (ref >60.00)
Glucose, Bld: 81 mg/dL (ref 70–99)
Potassium: 4.2 mEq/L (ref 3.5–5.1)
Sodium: 140 mEq/L (ref 135–145)
Total Bilirubin: 0.6 mg/dL (ref 0.2–1.2)
Total Protein: 6.6 g/dL (ref 6.0–8.3)

## 2020-10-15 LAB — LIPID PANEL
Cholesterol: 134 mg/dL (ref 0–200)
HDL: 60.8 mg/dL (ref >39.00)
LDL Cholesterol: 55 mg/dL (ref 0–99)
NonHDL: 73.57
Total CHOL/HDL Ratio: 2
Triglycerides: 93 mg/dL (ref 0.0–149.0)
VLDL: 18.6 mg/dL (ref 0.0–40.0)

## 2020-10-15 LAB — VITAMIN D 25 HYDROXY (VIT D DEFICIENCY, FRACTURES): VITD: 33.23 ng/mL (ref 30.00–100.00)

## 2020-10-15 MED ORDER — MELOXICAM 7.5 MG PO TABS
ORAL_TABLET | Freq: Every day | ORAL | 2 refills | Status: DC
Start: 2020-10-15 — End: 2021-01-21
  Filled 2020-10-15: qty 30, 30d supply, fill #0
  Filled 2020-11-14: qty 30, 30d supply, fill #1
  Filled 2020-12-16: qty 30, 30d supply, fill #2

## 2020-10-15 MED ORDER — PHENTERMINE HCL 37.5 MG PO TABS
37.5000 mg | ORAL_TABLET | Freq: Every day | ORAL | 0 refills | Status: DC
  Filled 2020-10-26: qty 30, 30d supply, fill #0

## 2020-10-15 MED ORDER — PHENTERMINE HCL 37.5 MG PO TABS
37.5000 mg | ORAL_TABLET | Freq: Every day | ORAL | 0 refills | Status: DC
Start: 2020-11-26 — End: 2021-02-08
  Filled 2020-11-28: qty 30, 30d supply, fill #0

## 2020-10-15 MED ORDER — PHENTERMINE HCL 37.5 MG PO TABS
37.5000 mg | ORAL_TABLET | Freq: Every day | ORAL | 0 refills | Status: DC
Start: 2020-12-25 — End: 2021-02-08
  Filled 2021-01-02: qty 30, 30d supply, fill #0

## 2020-10-15 MED FILL — Cyanocobalamin Inj 1000 MCG/ML: INTRAMUSCULAR | 28 days supply | Qty: 2 | Fill #0 | Status: AC

## 2020-10-15 MED FILL — Rosuvastatin Calcium Tab 10 MG: ORAL | 30 days supply | Qty: 30 | Fill #5 | Status: AC

## 2020-10-15 NOTE — Patient Instructions (Addendum)
Give [place] 2-3 business days to get the results of your labs back.   Keep the diet clean and stay active.  The new Shingrix vaccine (for shingles) is a 2 shot series. It can make people feel low energy, achy and almost like they have the flu for 48 hours after injection. Please plan accordingly when deciding on when to get this shot. Call our office for a nurse visit appointment to get this. The second shot of the series is less severe regarding the side effects, but it still lasts 48 hours.   I recommend getting the flu shot in mid October. This suggestion would change if the CDC comes out with a different recommendation.   Let me know if you change your mind about getting a colonoscopy or do your Cologard.   Let us know if you need anything.

## 2020-10-15 NOTE — Progress Notes (Signed)
Chief Complaint  Patient presents with   Annual Exam     Well Woman Stephanie Castro is here for a complete physical.   Her last physical was >1 year ago.  Current diet: in general, a "healthy" diet. Current exercise: stair stepper, pilates. Weight is stable and she denies fatigue out of ordinary. Seatbelt? Yes  Health Maintenance Mammogram- Yes Colon cancer screening-Due Shingrix- No Tetanus- Yes Hep C screening- No HIV screening- Yes  Past Medical History:  Diagnosis Date   Arthritis    both knees , back and elbows   BMI 35.0-35.9,adult 05/15/2013   Breast cancer screening, high risk patient 04/04/2013   Pt states she had mmg 2015 nml results, ordered by Dr  Ruben Gottron, [facility] health care. Results are not in Care everywhere. Mammogram completed 06/12/2015 at [facility] in [place], [place] Category 1    Cardiac murmur 10/19/2019   Concern about female breast disease without diagnosis 03/17/2018   DOE (dyspnea on exertion) 08/08/2015   Dysplastic nevus    beneath r eye   Edema of extremities 07/07/2013   Last Assessment & Plan:  Pt takes HCTZ daily.   Last Assessment & Plan:  Formatting of this note might be different from the original. Pt takes HCTZ daily.   Essential hypertension 02/26/2017   Exercise-induced asthma    Fatigue 07/06/2013   Last Assessment & Plan:  Will review previous records from previous PCP, pt had thyroid done recently and was wnl. Pt had questions re lupus/MS and how to get tested for these.   Last Assessment & Plan:  Formatting of this note might be different from the original. Will review previous records from previous PCP, pt had thyroid done recently and was wnl. Pt had questions re lupus/MS and how to get    GERD (gastroesophageal reflux disease)    Lateral epicondylitis of both elbows 08/06/2018   Low TSH level 04/22/2013   Myalgia 07/06/2013   Last Assessment & Plan:  Formatting of  this note might be different from the original. Will check labs today including sed rate, ANA/RF.  Last Assessment & Plan:  Formatting of this note might be different from the original. Pt would like to be tested for lupus in the future. Has been tested for RA and was negative   Other and unspecified hyperlipidemia 04/06/2011   Perimenopausal 05/15/2013   Periodic edema 04/04/2013   Pt reports taking "water pill" for many years due to feeling "swollen". Historically, took HCTZ but became hypokalemic. She was switched to triamterene-HCTZ with no more hypokalemic episodes.     Primary localized osteoarthritis of right knee 10/13/2014   Squamous cell carcinoma, face 02/24/2014   Tear of medial meniscus of right knee 10/13/2014   Vitamin B12 deficiency 05/15/2013   Last Assessment & Plan:  Formatting of this note might be different from the original. Recheck level today, may need to restart monthly Vit B12 shots   Vitamin D deficiency 02/24/2014   Weight gain 04/24/2015     Past Surgical History:  Procedure Laterality Date   ABDOMINAL HYSTERECTOMY  2007   KNEE ARTHROSCOPY WITH MEDIAL MENISECTOMY Right 10/13/2014   Procedure: RIGHT KNEE ARTHROSCOPY WITH PARTIAL MEDIAL MENISECTOMY AND CHONDROPLASTY ;  Surgeon: Marchia Bond, MD;  Location: [facility];  Service: Orthopedics;  Laterality: Right;    Medications  Current Outpatient Medications on File Prior to Visit  Medication Sig Dispense Refill   aspirin EC  81 MG tablet Take 1 tablet (81 mg total) by mouth daily. Swallow whole. 90 tablet 3   cyanocobalamin (,VITAMIN B-12,) 1000 MCG/ML injection INJECT 1ML INTO THE SKIN EVERY 2 WEEKS 10 mL 0   Diclofenac Sodium (PENNSAID) 2 % SOLN Place 1 application onto the skin 2 (two) times daily. 112 g 3   meloxicam (MOBIC) 7.5 MG tablet TAKE 1 TABLET BY MOUTH ONCE DAILY 30 tablet 2   olmesartan (BENICAR) 20 MG tablet Take 0.5 tablets (10 mg total) by mouth daily. 30 tablet 2   rosuvastatin (CRESTOR) 10  MG tablet TAKE 1 TABLET BY MOUTH ONCE DAILY 90 tablet 3   Vitamin D, Ergocalciferol, (DRISDOL) 1.25 MG (50000 UNIT) CAPS capsule Take 1 capsule (50,000 Units total) by mouth every 7 (seven) days. 12 capsule 0   Allergies Allergies  Allergen Reactions   Penicillins     Review of Systems: Constitutional:  no unexpected weight changes Eye:  no recent significant change in vision Ear/Nose/Mouth/Throat:  Ears:  no recent change in hearing Nose/Mouth/Throat:  no complaints of nasal congestion, no sore throat Cardiovascular: no chest pain Respiratory:  no shortness of breath Gastrointestinal:  no abdominal pain, no change in bowel habits GU:  Female: negative for dysuria or pelvic pain Musculoskeletal/Extremities:  no new pain of the joints Integumentary (Skin/Breast):  no abnormal skin lesions reported Neurologic:  no headaches Endocrine:  denies fatigue  Exam BP 108/74   Pulse (!) 57   Temp 97.8 F (36.6 C) (Oral)   Ht '5\' 2"'$  (1.575 m)   Wt 191 lb (86.6 kg)   SpO2 97%   BMI 34.93 kg/m  General:  well developed, well nourished, in no apparent distress Skin:  no significant moles, warts, or growths Head:  no masses, lesions, or tenderness Eyes:  pupils equal and round, sclera anicteric without injection Ears:  canals without lesions, TMs shiny without retraction, no obvious effusion, no erythema Nose:  nares patent, septum midline, mucosa normal, and no drainage or sinus tenderness Throat/Pharynx:  lips and gingiva without lesion; tongue and uvula midline; non-inflamed pharynx; no exudates or postnasal drainage Neck: neck supple without adenopathy, thyromegaly, or masses Lungs:  clear to auscultation, breath sounds equal bilaterally, no respiratory distress Cardio:  regular rate and rhythm, no LE edema Abdomen:  abdomen soft, nontender; bowel sounds normal; no masses or organomegaly Genital: Defer to GYN Musculoskeletal:  symmetrical muscle groups noted without atrophy or  deformity Extremities:  no clubbing, cyanosis, or edema, no deformities, no skin discoloration Neuro:  gait normal; deep tendon reflexes normal and symmetric Psych: well oriented with normal range of affect and appropriate judgment/insight  Assessment and Plan  Well adult exam - Plan: CBC, Comprehensive metabolic panel, Lipid panel, VITAMIN D 25 Hydroxy (Vit-D Deficiency, Fractures), B12  Encounter for hepatitis C screening test for low risk patient - Plan: Hepatitis C antibody  Obesity (BMI 30-39.9) - Plan: phentermine (ADIPEX-P) 37.5 MG tablet, phentermine (ADIPEX-P) 37.5 MG tablet   Well 55 y.o. female. Counseled on diet and exercise. Rec'd Shingrix and flu shot, declined.  CCS declined. Has not completed Cologard sampling. Not ready.  Other orders as above. Follow up in 6 mo or prn. The patient voiced understanding and agreement to the plan.  [place], DO 10/15/20 7:24 AM

## 2020-10-16 ENCOUNTER — Ambulatory Visit: Payer: 59 | Admitting: Family Medicine

## 2020-10-16 ENCOUNTER — Other Ambulatory Visit (HOSPITAL_BASED_OUTPATIENT_CLINIC_OR_DEPARTMENT_OTHER): Payer: Self-pay

## 2020-10-16 DIAGNOSIS — M7062 Trochanteric bursitis, left hip: Secondary | ICD-10-CM

## 2020-10-16 DIAGNOSIS — E790 Hyperuricemia without signs of inflammatory arthritis and tophaceous disease: Secondary | ICD-10-CM

## 2020-10-16 DIAGNOSIS — E538 Deficiency of other specified B group vitamins: Secondary | ICD-10-CM | POA: Diagnosis not present

## 2020-10-16 DIAGNOSIS — E559 Vitamin D deficiency, unspecified: Secondary | ICD-10-CM

## 2020-10-16 DIAGNOSIS — R635 Abnormal weight gain: Secondary | ICD-10-CM

## 2020-10-16 LAB — HEPATITIS C ANTIBODY
Hepatitis C Ab: NONREACTIVE
SIGNAL TO CUT-OFF: 0.01 (ref <1.00)

## 2020-10-16 MED ORDER — CYANOCOBALAMIN 1000 MCG/ML IJ SOLN
INTRAMUSCULAR | 0 refills | Status: DC
Start: 2020-10-16 — End: 2020-10-30
  Filled 2020-10-16: qty 10, fill #0
  Filled 2020-10-26: qty 2, 28d supply, fill #0

## 2020-10-16 NOTE — Assessment & Plan Note (Signed)
Continue the once weekly vitamin D supplementation

## 2020-10-16 NOTE — Assessment & Plan Note (Signed)
Patient still states that he continues to have discomfort and pain.  Unfortunately continues to give patient difficulty.  Increase activity as tolerated.  Patient is still on a wait list for healthy weight and wellness but states that her insurance may not pay for it so she has not followed through with anything else at this time.

## 2020-10-16 NOTE — Assessment & Plan Note (Signed)
Patient has been noncompliant with the once monthly.  We will do once weekly for 4 weeks and then every other week for another 2 months then transition to monthly.  Do believe that this will be beneficial for patient's pain as well as potential weight loss.

## 2020-10-16 NOTE — Assessment & Plan Note (Signed)
Improvement noted after the injection.  Patient continues to have difficulty patient does have some underlying arthritic changes that could also be contributing.  Patient does have meloxicam as needed for breakthrough.  Continue with vitamin D, B12 as well as the other medications.  Follow-up with me again in 2 to 3 months

## 2020-10-16 NOTE — Assessment & Plan Note (Signed)
Has had elevation.  Still wants to avoid continue medications.  Continue to monitor.  Possibly at follow-up consider getting laboratory work-up including vitamin D, B12 as well as uric acid levels.

## 2020-10-16 NOTE — Patient Instructions (Signed)
Good to see you  I think we will make some changes  Do myfitnesspal for 3 days and see where you are on protein  Stay hydrated  I am hoping the change in B12 will help Do B12 1 time a week for a month, then every other week for 2 months then monthly thereafter  Continue the once weekly vitamin D  Stay active See me again in 8-12 weeks

## 2020-10-22 ENCOUNTER — Encounter: Payer: Self-pay | Admitting: Gastroenterology

## 2020-10-23 ENCOUNTER — Other Ambulatory Visit: Payer: Self-pay | Admitting: Family Medicine

## 2020-10-23 ENCOUNTER — Other Ambulatory Visit (HOSPITAL_BASED_OUTPATIENT_CLINIC_OR_DEPARTMENT_OTHER): Payer: Self-pay

## 2020-10-23 MED ORDER — OLMESARTAN MEDOXOMIL 20 MG PO TABS
10.0000 mg | ORAL_TABLET | Freq: Every day | ORAL | 1 refills | Status: DC
  Filled 2020-10-23: qty 15, 30d supply, fill #0
  Filled 2020-11-19: qty 15, 30d supply, fill #1
  Filled 2020-12-16: qty 15, 30d supply, fill #2

## 2020-10-26 ENCOUNTER — Encounter: Payer: Self-pay | Admitting: Family Medicine

## 2020-10-26 ENCOUNTER — Other Ambulatory Visit (HOSPITAL_BASED_OUTPATIENT_CLINIC_OR_DEPARTMENT_OTHER): Payer: Self-pay

## 2020-10-30 ENCOUNTER — Other Ambulatory Visit (HOSPITAL_BASED_OUTPATIENT_CLINIC_OR_DEPARTMENT_OTHER): Payer: Self-pay

## 2020-10-30 ENCOUNTER — Other Ambulatory Visit: Payer: Self-pay

## 2020-10-30 DIAGNOSIS — E538 Deficiency of other specified B group vitamins: Secondary | ICD-10-CM

## 2020-10-30 MED ORDER — CYANOCOBALAMIN 1000 MCG/ML IJ SOLN
INTRAMUSCULAR | 0 refills | Status: DC
  Filled 2020-10-30 – 2020-11-05 (×2): qty 2, 28d supply, fill #0
  Filled 2020-12-07: qty 2, 28d supply, fill #1
  Filled 2021-01-02: qty 2, 28d supply, fill #2
  Filled 2021-02-08: qty 2, 28d supply, fill #3

## 2020-10-31 ENCOUNTER — Other Ambulatory Visit (HOSPITAL_BASED_OUTPATIENT_CLINIC_OR_DEPARTMENT_OTHER): Payer: Self-pay

## 2020-11-05 ENCOUNTER — Other Ambulatory Visit (HOSPITAL_BASED_OUTPATIENT_CLINIC_OR_DEPARTMENT_OTHER): Payer: Self-pay

## 2020-11-06 ENCOUNTER — Other Ambulatory Visit (HOSPITAL_BASED_OUTPATIENT_CLINIC_OR_DEPARTMENT_OTHER): Payer: Self-pay

## 2020-11-14 ENCOUNTER — Other Ambulatory Visit (HOSPITAL_BASED_OUTPATIENT_CLINIC_OR_DEPARTMENT_OTHER): Payer: Self-pay

## 2020-11-14 ENCOUNTER — Other Ambulatory Visit: Payer: Self-pay

## 2020-11-14 MED FILL — Rosuvastatin Calcium Tab 10 MG: ORAL | 30 days supply | Qty: 30 | Fill #6 | Status: AC

## 2020-11-19 ENCOUNTER — Other Ambulatory Visit (HOSPITAL_BASED_OUTPATIENT_CLINIC_OR_DEPARTMENT_OTHER): Payer: Self-pay

## 2020-11-22 ENCOUNTER — Ambulatory Visit (AMBULATORY_SURGERY_CENTER): Payer: 59

## 2020-11-22 ENCOUNTER — Other Ambulatory Visit: Payer: Self-pay

## 2020-11-22 ENCOUNTER — Other Ambulatory Visit (HOSPITAL_BASED_OUTPATIENT_CLINIC_OR_DEPARTMENT_OTHER): Payer: Self-pay

## 2020-11-22 VITALS — Ht 62.0 in | Wt 187.0 lb

## 2020-11-22 DIAGNOSIS — Z1211 Encounter for screening for malignant neoplasm of colon: Secondary | ICD-10-CM

## 2020-11-22 MED ORDER — PLENVU 140 G PO SOLR
1.0000 | Freq: Once | ORAL | 0 refills | Status: AC
  Filled 2020-11-22: qty 1, 1d supply, fill #0
  Filled 2020-11-29: qty 3, 28d supply, fill #0

## 2020-11-22 NOTE — Progress Notes (Signed)
No egg or soy allergy known to patient  No issues known to pt with past sedation with any surgeries or procedures Patient denies ever being told they had issues or difficulty with intubation  No FH of Malignant Hyperthermia Pt is not on diet pills Pt is not on  home 02  Pt is not on blood thinners  Pt denies issues with constipation  No A fib or A flutter  Plenvu Coupon given to pt in PV today , Code to Pharmacy and  NO PA's for preps discussed with pt In PV today  Discussed with pt there will be an out-of-pocket cost for prep and that varies from $0 to 70 +  dollars - pt verbalized understanding   Due to the COVID-19 pandemic we are asking patients to follow certain guidelines in PV and the [place]   Pt aware of COVID protocols and [facility] guidelines

## 2020-11-23 ENCOUNTER — Encounter: Payer: Self-pay | Admitting: Gastroenterology

## 2020-11-28 ENCOUNTER — Other Ambulatory Visit (HOSPITAL_BASED_OUTPATIENT_CLINIC_OR_DEPARTMENT_OTHER): Payer: Self-pay

## 2020-11-29 ENCOUNTER — Other Ambulatory Visit (HOSPITAL_BASED_OUTPATIENT_CLINIC_OR_DEPARTMENT_OTHER): Payer: Self-pay

## 2020-11-30 ENCOUNTER — Other Ambulatory Visit: Payer: Self-pay

## 2020-11-30 ENCOUNTER — Ambulatory Visit: Payer: 59 | Admitting: Cardiology

## 2020-11-30 ENCOUNTER — Encounter: Payer: Self-pay | Admitting: Cardiology

## 2020-11-30 VITALS — BP 102/68 | HR 66 | Ht 62.0 in | Wt 192.0 lb

## 2020-11-30 DIAGNOSIS — I10 Essential (primary) hypertension: Secondary | ICD-10-CM | POA: Diagnosis not present

## 2020-11-30 DIAGNOSIS — E782 Mixed hyperlipidemia: Secondary | ICD-10-CM

## 2020-11-30 DIAGNOSIS — E669 Obesity, unspecified: Secondary | ICD-10-CM

## 2020-11-30 HISTORY — DX: Obesity, unspecified: E66.9

## 2020-11-30 NOTE — Progress Notes (Signed)
Cardiology Office Note:    Date:  11/30/2020   ID:  Stephanie Castro, DOB 02/27/1965, MRN [id number]  PCP:  [name], DO  Cardiologist:  Jenean Lindau, MD   Referring MD: [name]*    ASSESSMENT:    1. Essential hypertension   2. Mixed dyslipidemia   3. Obesity (BMI 35.0-39.9 without comorbidity)    PLAN:    In order of problems listed above:  Primary prevention stressed with the patient.  Importance of compliance with diet medication stressed and she vocalized understanding. Essential hypertension: Her blood pressure is borderline.  I told her to keep a track of her blood pressures.  She tells me that her blood pressure runs in the range of 606 systolic.  She gives history of some fatigue, so I told her to withhold her Benicar for a few days and she how she feels.  She will keep a track of her blood pressures.  If they are greater than 130 she will let us know otherwise she will do lifestyle modification. Mixed dyslipidemia: Lipids were reviewed diet was emphasized and she is doing well. Obesity: Weight reduction stressed risks of obesity explained and she promises to do better. Patient will be seen in follow-up appointment in 12 months or earlier if the patient has any concerns    Medication Adjustments/Labs and Tests Ordered: Current medicines are reviewed at length with the patient today.  Concerns regarding medicines are outlined above.  Orders Placed This Encounter  Procedures   EKG 12-Lead   No orders of the defined types were placed in this encounter.    No chief complaint on file.    History of Present Illness:    Stephanie Castro is a 55 y.o. female.  Patient has past medical history of essential hypertension, dyslipidemia and obesity.  She denies any problems at this time and takes care of activities of daily living.  She exercises to the best of her ability but she has knee issues.  No chest pain orthopnea or PND.  At the time of my  evaluation, the patient is alert awake oriented and in no distress.  Past Medical History:  Diagnosis Date   Arthritis    both knees , back and elbows   BMI 35.0-35.9,adult 05/15/2013   Breast cancer screening, high risk patient 04/04/2013   Pt states she had mmg 2015 nml results, ordered by Dr  Ruben Gottron, [facility] health care. Results are not in Care everywhere. Mammogram completed 06/12/2015 at [facility] in [place], [place] Category 1    Cardiac murmur 10/19/2019   Chronic pain of right knee 04/13/2020   Class 2 severe obesity due to excess calories with serious comorbidity and body mass index (BMI) of 35.0 to 35.9 in adult [facility]) 09/14/2020   Concern about female breast disease without diagnosis 03/17/2018   DOE (dyspnea on exertion) 08/08/2015   Dysplastic nevus    beneath r eye   Edema of extremities 07/07/2013   Last Assessment & Plan:  Pt takes HCTZ daily.   Last Assessment & Plan:  Formatting of this note might be different from the original. Pt takes HCTZ daily.   Elevated BP without diagnosis of hypertension 06/15/2015   Elevated uric acid in blood 05/02/2020   Essential hypertension 02/26/2017   Exercise-induced asthma    Fatigue 07/06/2013   Last Assessment & Plan:  Will review previous records from previous PCP, pt had thyroid  done recently and was wnl. Pt had questions re lupus/MS and how to get tested for these.   Last Assessment & Plan:  Formatting of this note might be different from the original. Will review previous records from previous PCP, pt had thyroid done recently and was wnl. Pt had questions re lupus/MS and how to get    GERD (gastroesophageal reflux disease)    Greater trochanteric bursitis of left hip 08/30/2020   Injection given August 30, 2020   Hypertension    Lateral epicondylitis of both elbows 08/06/2018   Left knee pain 08/30/2020   Low TSH level 04/22/2013   Mixed dyslipidemia 02/22/2020   Myalgia  07/06/2013   Last Assessment & Plan:  Formatting of this note might be different from the original. Will check labs today including sed rate, ANA/RF.  Last Assessment & Plan:  Formatting of this note might be different from the original. Pt would like to be tested for lupus in the future. Has been tested for RA and was negative   Other and unspecified hyperlipidemia 04/06/2011   Palpitations 10/19/2019   Perimenopausal 05/15/2013   Periodic edema 04/04/2013   Pt reports taking "water pill" for many years due to feeling "swollen". Historically, took HCTZ but became hypokalemic. She was switched to triamterene-HCTZ with no more hypokalemic episodes.     Preventative health care 04/04/2013   Primary localized osteoarthritis of right knee 10/13/2014   Squamous cell carcinoma, face 02/24/2014   Tear of medial meniscus of right knee 10/13/2014   Vitamin B12 deficiency 05/15/2013   Last Assessment & Plan:  Formatting of this note might be different from the original. Recheck level today, may need to restart monthly Vit B12 shots   Vitamin D deficiency 02/24/2014   Weight gain 04/24/2015    Past Surgical History:  Procedure Laterality Date   ABDOMINAL HYSTERECTOMY  2007   KNEE ARTHROSCOPY WITH MEDIAL MENISECTOMY Right 10/13/2014   Procedure: RIGHT KNEE ARTHROSCOPY WITH PARTIAL MEDIAL MENISECTOMY AND CHONDROPLASTY ;  Surgeon: Marchia Bond, MD;  Location: [place];  Service: Orthopedics;  Laterality: Right;    Current Medications: Current Meds  Medication Sig   aspirin EC 81 MG tablet Take 1 tablet (81 mg total) by mouth daily. Swallow whole.   cyanocobalamin (,VITAMIN B-12,) 1000 MCG/ML injection INJECT 1ML INTO THE SKIN EVERY 2 WEEKS   Diclofenac Sodium (PENNSAID) 2 % SOLN Place 1 application onto the skin 2 (two) times daily.   ergocalciferol (VITAMIN D2) 1.25 MG (50000 UT) capsule Take 50,000 Units by mouth once a week.   meloxicam (MOBIC) 7.5 MG tablet TAKE 1 TABLET BY MOUTH ONCE DAILY    olmesartan (BENICAR) 20 MG tablet Take 1/2 tablet (10 mg total) by mouth daily.   PEG-KCl-NaCl-NaSulf-Na Asc-C (PLENVU) 140 g SOLR Take 1 Dose by mouth once for 1 dose.   phentermine (ADIPEX-P) 37.5 MG tablet Take 1 tablet (37.5 mg total) by mouth daily before breakfast.   phentermine (ADIPEX-P) 37.5 MG tablet Take 1 tablet (37.5 mg total) by mouth daily before breakfast.   [START ON 12/25/2020] phentermine (ADIPEX-P) 37.5 MG tablet Take 1 tablet (37.5 mg total) by mouth daily before breakfast.   rosuvastatin (CRESTOR) 10 MG tablet TAKE 1 TABLET BY MOUTH ONCE DAILY   Vitamin D, Ergocalciferol, (DRISDOL) 1.25 MG (50000 UNIT) CAPS capsule Take 1 capsule (50,000 Units total) by mouth every 7 (seven) days.     Allergies:   Penicillins   Social History   Socioeconomic History  Marital status: Married    Spouse name: Not on file   Number of children: Not on file   Years of education: Not on file   Highest education level: Not on file  Occupational History   Not on file  Tobacco Use   Smoking status: Former    Packs/day: 1.00    Years: 20.00    Pack years: 20.00    Types: Cigarettes    Quit date: 2005    Years since quitting: 17.8   Smokeless tobacco: Never  Vaping Use   Vaping Use: Never used  Substance and Sexual Activity   Alcohol use: Yes    Alcohol/week: 2.0 standard drinks    Types: 1 Glasses of wine, 1 Shots of liquor per week   Drug use: Never   Sexual activity: Yes    Birth control/protection: Surgical  Other Topics Concern   Not on file  Social History Narrative   Married to [place].    Social Determinants of Health   Financial Resource Strain: Not on file  Food Insecurity: Not on file  Transportation Needs: Not on file  Physical Activity: Not on file  Stress: Not on file  Social Connections: Not on file     Family History: The patient's family history includes Breast cancer in her maternal aunt and maternal grandmother; Breast cancer (age of onset: 48) in  her mother; COPD in her father; Colon cancer in her maternal uncle; Colon polyps in her mother and sister; Diabetes in her maternal grandfather, maternal grandmother, and maternal uncle; Heart attack in her father, sister, and sister; Hyperlipidemia in her maternal aunt, maternal grandmother, mother, and sister; Hypertension in her maternal uncle and sister. There is no history of Sudden death, Esophageal cancer, Rectal cancer, or Stomach cancer.  ROS:   Please see the history of present illness.    All other systems reviewed and are negative.  EKGs/Labs/Other Studies Reviewed:    The following studies were reviewed today: I discussed my findings with the patient at length   Recent Labs: 10/15/2020: ALT 16; BUN 16; Creatinine, Ser 0.76; Hemoglobin 12.8; Platelets 175.0; Potassium 4.2; Sodium 140  Recent Lipid Panel    Component Value Date/Time   CHOL 134 10/15/2020 0723   CHOL 185 03/07/2020 0803   TRIG 93.0 10/15/2020 0723   HDL 60.80 10/15/2020 0723   HDL 60 03/07/2020 0803   CHOLHDL 2 10/15/2020 0723   VLDL 18.6 10/15/2020 0723   LDLCALC 55 10/15/2020 0723   LDLCALC 101 (H) 03/07/2020 0803    Physical Exam:    VS:  BP 102/68   Pulse 66   Ht 5\' 2"  (1.575 m)   Wt 192 lb (87.1 kg)   SpO2 97%   BMI 35.12 kg/m     Wt Readings from Last 3 Encounters:  11/30/20 192 lb (87.1 kg)  11/22/20 187 lb (84.8 kg)  10/16/20 194 lb (88 kg)     GEN: Patient is in no acute distress HEENT: Normal NECK: No JVD; No carotid bruits LYMPHATICS: No lymphadenopathy CARDIAC: Hear sounds regular, 2/6 systolic murmur at the apex. RESPIRATORY:  Clear to auscultation without rales, wheezing or rhonchi  ABDOMEN: Soft, non-tender, non-distended MUSCULOSKELETAL:  No edema; No deformity  SKIN: Warm and dry NEUROLOGIC:  Alert and oriented x 3 PSYCHIATRIC:  Normal affect   Signed, Jenean Lindau, MD  11/30/2020 4:37 PM    [facility]

## 2020-11-30 NOTE — Patient Instructions (Signed)

## 2020-12-06 NOTE — Progress Notes (Signed)
Stephanie Castro [address] [place] Phone: [phone] Subjective:   Stephanie Castro, am serving as a scribe for Dr. Hulan Saas. This visit occurred during the SARS-CoV-2 public health emergency.  Safety protocols were in place, including screening questions prior to the visit, additional usage of staff PPE, and extensive cleaning of exam room while observing appropriate contact time as indicated for disinfecting solutions.   I'm seeing this patient by the request  of:  [name], DO  CC: Left hip pain  [id number]  10/16/2020 Improvement noted after the injection.  Patient continues to have difficulty patient does have some underlying arthritic changes that could also be contributing.  Patient does have meloxicam as needed for breakthrough.  Continue with vitamin D, B12 as well as the other medications.  Follow-up with me again in 2 to 3 months Patient has been noncompliant with the once monthly.  We will do once weekly for 4 weeks and then every other week for another 2 months then transition to monthly.  Do believe that this will be beneficial for patient's pain as well as potential weight loss.  Updated 12/11/2020 Stephanie Castro is a 55 y.o. female coming in with complaint of hip pain. Pain is about the same, no changes. Hip is still stiff and lacks ROM. Wanted to know about frozen hip because she had a frozen shoulder before. No new complaints.  Patient does feel like if she has been doing everything and is not making any significant improvement at this time.  Reviewed patient's x-rays previously reviewed show patient did have mild degenerative hip arthritis.     Past Medical History:  Diagnosis Date   Arthritis    both knees , back and elbows   BMI 35.0-35.9,adult 05/15/2013   Breast cancer screening, high risk patient 04/04/2013   Pt states she had mmg 2015 nml results, ordered by Dr  Ruben Gottron, [facility] health care. Results  are not in Care everywhere. Mammogram completed 06/12/2015 at [facility] in [place], [place] Category 1    Cardiac murmur 10/19/2019   Chronic pain of right knee 04/13/2020   Class 2 severe obesity due to excess calories with serious comorbidity and body mass index (BMI) of 35.0 to 35.9 in adult [facility]) 09/14/2020   Concern about female breast disease without diagnosis 03/17/2018   DOE (dyspnea on exertion) 08/08/2015   Dysplastic nevus    beneath r eye   Edema of extremities 07/07/2013   Last Assessment & Plan:  Pt takes HCTZ daily.   Last Assessment & Plan:  Formatting of this note might be different from the original. Pt takes HCTZ daily.   Elevated BP without diagnosis of hypertension 06/15/2015   Elevated uric acid in blood 05/02/2020   Essential hypertension 02/26/2017   Exercise-induced asthma    Fatigue 07/06/2013   Last Assessment & Plan:  Will review previous records from previous PCP, pt had thyroid done recently and was wnl. Pt had questions re lupus/MS and how to get tested for these.   Last Assessment & Plan:  Formatting of this note might be different from the original. Will review previous records from previous PCP, pt had thyroid done recently and was wnl. Pt had questions re lupus/MS and how to get    GERD (gastroesophageal reflux disease)    Greater trochanteric bursitis of left hip 08/30/2020   Injection given August 30, 2020  Hypertension    Lateral epicondylitis of both elbows 08/06/2018   Left knee pain 08/30/2020   Low TSH level 04/22/2013   Mixed dyslipidemia 02/22/2020   Myalgia 07/06/2013   Last Assessment & Plan:  Formatting of this note might be different from the original. Will check labs today including sed rate, ANA/RF.  Last Assessment & Plan:  Formatting of this note might be different from the original. Pt would like to be tested for lupus in the future. Has been tested for RA and was negative    Other and unspecified hyperlipidemia 04/06/2011   Palpitations 10/19/2019   Perimenopausal 05/15/2013   Periodic edema 04/04/2013   Pt reports taking "water pill" for many years due to feeling "swollen". Historically, took HCTZ but became hypokalemic. She was switched to triamterene-HCTZ with no more hypokalemic episodes.     Preventative health care 04/04/2013   Primary localized osteoarthritis of right knee 10/13/2014   Squamous cell carcinoma, face 02/24/2014   Tear of medial meniscus of right knee 10/13/2014   Vitamin B12 deficiency 05/15/2013   Last Assessment & Plan:  Formatting of this note might be different from the original. Recheck level today, may need to restart monthly Vit B12 shots   Vitamin D deficiency 02/24/2014   Weight gain 04/24/2015   Past Surgical History:  Procedure Laterality Date   ABDOMINAL HYSTERECTOMY  2007   KNEE ARTHROSCOPY WITH MEDIAL MENISECTOMY Right 10/13/2014   Procedure: RIGHT KNEE ARTHROSCOPY WITH PARTIAL MEDIAL MENISECTOMY AND CHONDROPLASTY ;  Surgeon: Marchia Bond, MD;  Location: [facility];  Service: Orthopedics;  Laterality: Right;   Social History   Socioeconomic History   Marital status: Married    Spouse name: Not on file   Number of children: Not on file   Years of education: Not on file   Highest education level: Not on file  Occupational History   Not on file  Tobacco Use   Smoking status: Former    Packs/day: 1.00    Years: 20.00    Pack years: 20.00    Types: Cigarettes    Quit date: 2005    Years since quitting: 17.8   Smokeless tobacco: Never  Vaping Use   Vaping Use: Never used  Substance and Sexual Activity   Alcohol use: Yes    Alcohol/week: 2.0 standard drinks    Types: 1 Glasses of wine, 1 Shots of liquor per week   Drug use: Never   Sexual activity: Yes    Birth control/protection: Surgical  Other Topics Concern   Not on file  Social History Narrative   Married to [place].    Social Determinants of Health    Financial Resource Strain: Not on file  Food Insecurity: Not on file  Transportation Needs: Not on file  Physical Activity: Not on file  Stress: Not on file  Social Connections: Not on file   Allergies  Allergen Reactions   Penicillins    Family History  Problem Relation Age of Onset   Colon polyps Mother    Hyperlipidemia Mother    Breast cancer Mother 49       breast   Heart attack Father    COPD Father    Colon polyps Sister    Heart attack Sister    Hyperlipidemia Sister    Hypertension Sister    Heart attack Sister    Hyperlipidemia Maternal Aunt    Breast cancer Maternal Aunt        breast   Hypertension  Maternal Uncle    Diabetes Maternal Uncle    Colon cancer Maternal Uncle        colon   Hyperlipidemia Maternal Grandmother    Diabetes Maternal Grandmother    Breast cancer Maternal Grandmother        breast   Diabetes Maternal Grandfather    Sudden death Neg Hx    Esophageal cancer Neg Hx    Rectal cancer Neg Hx    Stomach cancer Neg Hx      Current Outpatient Medications (Cardiovascular):    olmesartan (BENICAR) 20 MG tablet, Take 1/2 tablet (10 mg total) by mouth daily.   rosuvastatin (CRESTOR) 10 MG tablet, TAKE 1 TABLET BY MOUTH ONCE DAILY   Current Outpatient Medications (Analgesics):    aspirin EC 81 MG tablet, Take 1 tablet (81 mg total) by mouth daily. Swallow whole.   meloxicam (MOBIC) 7.5 MG tablet, TAKE 1 TABLET BY MOUTH ONCE DAILY  Current Outpatient Medications (Hematological):    cyanocobalamin (,VITAMIN B-12,) 1000 MCG/ML injection, INJECT 1ML INTO THE SKIN EVERY 2 WEEKS  Current Outpatient Medications (Other):    Diclofenac Sodium (PENNSAID) 2 % SOLN, Place 1 application onto the skin 2 (two) times daily. (Patient not taking: Reported on 12/10/2020)   ergocalciferol (VITAMIN D2) 1.25 MG (50000 UT) capsule, Take 50,000 Units by mouth once a week.   phentermine (ADIPEX-P) 37.5 MG tablet, Take 1 tablet (37.5 mg total) by mouth daily  before breakfast.   phentermine (ADIPEX-P) 37.5 MG tablet, Take 1 tablet (37.5 mg total) by mouth daily before breakfast.   [START ON 12/25/2020] phentermine (ADIPEX-P) 37.5 MG tablet, Take 1 tablet (37.5 mg total) by mouth daily before breakfast.   Vitamin D, Ergocalciferol, (DRISDOL) 1.25 MG (50000 UNIT) CAPS capsule, Take 1 capsule (50,000 Units total) by mouth every 7 (seven) days.    Review of Systems:  No headache, visual changes, nausea, vomiting, diarrhea, constipation, dizziness, abdominal pain, skin rash, fevers, chills, night sweats, weight loss, swollen lymph nodes, body aches, joint swelling, chest pain, shortness of breath, mood changes. POSITIVE muscle aches  Objective  Blood pressure 122/80, pulse 63, height 5\' 2"  (1.575 m), weight 191 lb (86.6 kg), SpO2 98 %.   General: No apparent distress alert and oriented x3 mood and affect normal, dressed appropriately.  HEENT: Pupils equal, extraocular movements intact  Respiratory: Patient's speak in full sentences and does not appear short of breath  Cardiovascular: No lower extremity edema, non tender, no erythema  Gait normal with good balance and coordination.  MSK: Left hip exam shows the patient does have a decrease in range of motion with internal and external range of motion.  Even forward flexion seems to be less than previous exam.  Patient does have strength but feels like there is even tightness with straight leg test.  No true radicular symptoms down the leg.    Impression and Recommendations:     The above documentation has been reviewed and is accurate and complete Lyndal Pulley, DO

## 2020-12-07 ENCOUNTER — Other Ambulatory Visit (HOSPITAL_BASED_OUTPATIENT_CLINIC_OR_DEPARTMENT_OTHER): Payer: Self-pay

## 2020-12-07 ENCOUNTER — Other Ambulatory Visit: Payer: Self-pay | Admitting: Family Medicine

## 2020-12-10 ENCOUNTER — Ambulatory Visit (AMBULATORY_SURGERY_CENTER): Payer: 59 | Admitting: Gastroenterology

## 2020-12-10 ENCOUNTER — Encounter: Payer: Self-pay | Admitting: Gastroenterology

## 2020-12-10 ENCOUNTER — Other Ambulatory Visit: Payer: Self-pay

## 2020-12-10 VITALS — BP 108/62 | HR 48 | Temp 98.6°F | Resp 13 | Ht 62.0 in | Wt 187.0 lb

## 2020-12-10 DIAGNOSIS — K635 Polyp of colon: Secondary | ICD-10-CM

## 2020-12-10 DIAGNOSIS — Z1211 Encounter for screening for malignant neoplasm of colon: Secondary | ICD-10-CM

## 2020-12-10 DIAGNOSIS — D123 Benign neoplasm of transverse colon: Secondary | ICD-10-CM

## 2020-12-10 MED ORDER — SODIUM CHLORIDE 0.9 % IV SOLN: 500.0000 mL | Freq: Once | INTRAVENOUS | Status: DC

## 2020-12-10 NOTE — Progress Notes (Signed)
Called to room to assist during endoscopic procedure.  Patient ID and intended procedure confirmed with present staff. Received instructions for my participation in the procedure from the performing physician.  

## 2020-12-10 NOTE — Progress Notes (Signed)
History and Physical:  This patient presents for endoscopic testing for: Encounter Diagnosis  Name Primary?   Special screening for malignant neoplasms, colon Yes    Patient denies chronic abdominal pain, rectal bleeding, constipation or diarrhea.   ROS: Patient denies chest pain or cough   Past Medical History: Past Medical History:  Diagnosis Date   Arthritis    both knees , back and elbows   BMI 35.0-35.9,adult 05/15/2013   Breast cancer screening, high risk patient 04/04/2013   Pt states she had mmg 2015 nml results, ordered by Dr  Ruben Gottron, [facility] health care. Results are not in Care everywhere. Mammogram completed 06/12/2015 at [facility] in [place], [place] Category 1    Cardiac murmur 10/19/2019   Chronic pain of right knee 04/13/2020   Class 2 severe obesity due to excess calories with serious comorbidity and body mass index (BMI) of 35.0 to 35.9 in adult [facility]) 09/14/2020   Concern about female breast disease without diagnosis 03/17/2018   DOE (dyspnea on exertion) 08/08/2015   Dysplastic nevus    beneath r eye   Edema of extremities 07/07/2013   Last Assessment & Plan:  Pt takes HCTZ daily.   Last Assessment & Plan:  Formatting of this note might be different from the original. Pt takes HCTZ daily.   Elevated BP without diagnosis of hypertension 06/15/2015   Elevated uric acid in blood 05/02/2020   Essential hypertension 02/26/2017   Exercise-induced asthma    Fatigue 07/06/2013   Last Assessment & Plan:  Will review previous records from previous PCP, pt had thyroid done recently and was wnl. Pt had questions re lupus/MS and how to get tested for these.   Last Assessment & Plan:  Formatting of this note might be different from the original. Will review previous records from previous PCP, pt had thyroid done recently and was wnl. Pt had questions re lupus/MS and how to get    GERD (gastroesophageal reflux  disease)    Greater trochanteric bursitis of left hip 08/30/2020   Injection given August 30, 2020   Hypertension    Lateral epicondylitis of both elbows 08/06/2018   Left knee pain 08/30/2020   Low TSH level 04/22/2013   Mixed dyslipidemia 02/22/2020   Myalgia 07/06/2013   Last Assessment & Plan:  Formatting of this note might be different from the original. Will check labs today including sed rate, ANA/RF.  Last Assessment & Plan:  Formatting of this note might be different from the original. Pt would like to be tested for lupus in the future. Has been tested for RA and was negative   Other and unspecified hyperlipidemia 04/06/2011   Palpitations 10/19/2019   Perimenopausal 05/15/2013   Periodic edema 04/04/2013   Pt reports taking "water pill" for many years due to feeling "swollen". Historically, took HCTZ but became hypokalemic. She was switched to triamterene-HCTZ with no more hypokalemic episodes.     Preventative health care 04/04/2013   Primary localized osteoarthritis of right knee 10/13/2014   Squamous cell carcinoma, face 02/24/2014   Tear of medial meniscus of right knee 10/13/2014   Vitamin B12 deficiency 05/15/2013   Last Assessment & Plan:  Formatting of this note might be different from the original. Recheck level today, may need to restart monthly Vit B12 shots   Vitamin D deficiency 02/24/2014   Weight gain 04/24/2015     Past Surgical History: Past Surgical History:  Procedure Laterality  Date   ABDOMINAL HYSTERECTOMY  2007   KNEE ARTHROSCOPY WITH MEDIAL MENISECTOMY Right 10/13/2014   Procedure: RIGHT KNEE ARTHROSCOPY WITH PARTIAL MEDIAL MENISECTOMY AND CHONDROPLASTY ;  Surgeon: Marchia Bond, MD;  Location: [facility];  Service: Orthopedics;  Laterality: Right;    Allergies: Allergies  Allergen Reactions   Penicillins     Outpatient Meds: Current Outpatient Medications  Medication Sig Dispense Refill   aspirin EC 81 MG tablet Take 1 tablet (81 mg total) by mouth  daily. Swallow whole. 90 tablet 3   cyanocobalamin (,VITAMIN B-12,) 1000 MCG/ML injection INJECT 1ML INTO THE SKIN EVERY 2 WEEKS 10 mL 0   ergocalciferol (VITAMIN D2) 1.25 MG (50000 UT) capsule Take 50,000 Units by mouth once a week.     meloxicam (MOBIC) 7.5 MG tablet TAKE 1 TABLET BY MOUTH ONCE DAILY 30 tablet 2   olmesartan (BENICAR) 20 MG tablet Take 1/2 tablet (10 mg total) by mouth daily. 45 tablet 1   rosuvastatin (CRESTOR) 10 MG tablet TAKE 1 TABLET BY MOUTH ONCE DAILY 90 tablet 3   Vitamin D, Ergocalciferol, (DRISDOL) 1.25 MG (50000 UNIT) CAPS capsule Take 1 capsule (50,000 Units total) by mouth every 7 (seven) days. 12 capsule 0   Diclofenac Sodium (PENNSAID) 2 % SOLN Place 1 application onto the skin 2 (two) times daily. (Patient not taking: Reported on 12/10/2020) 112 g 3   phentermine (ADIPEX-P) 37.5 MG tablet Take 1 tablet (37.5 mg total) by mouth daily before breakfast. 30 tablet 0   phentermine (ADIPEX-P) 37.5 MG tablet Take 1 tablet (37.5 mg total) by mouth daily before breakfast. 30 tablet 0   [START ON 12/25/2020] phentermine (ADIPEX-P) 37.5 MG tablet Take 1 tablet (37.5 mg total) by mouth daily before breakfast. 30 tablet 0   Current Facility-Administered Medications  Medication Dose Route Frequency Provider Last Rate Last Admin   0.9 %  sodium chloride infusion  500 mL Intravenous Once Doran Stabler, MD          ___________________________________________________________________ Objective   Exam:  BP 117/63   Pulse 60   Temp 98.6 F (37 C)   Ht 5\' 2"  (1.575 m)   Wt 187 lb (84.8 kg)   SpO2 96%   BMI 34.20 kg/m   CV: RRR without murmur, S1/S2 Resp: clear to auscultation bilaterally, normal RR and effort noted GI: soft, no tenderness, with active bowel sounds.   Assessment: Encounter Diagnosis  Name Primary?   Special screening for malignant neoplasms, colon Yes   Avg risk for CRC  Plan: Colonoscopy  The benefits and risks of the planned  procedure were described in detail with the patient or (when appropriate) their health care proxy.  Risks were outlined as including, but not limited to, bleeding, infection, perforation, adverse medication reaction leading to cardiac or pulmonary decompensation, pancreatitis (if ERCP).  The limitation of incomplete mucosal visualization was also discussed.  No guarantees or warranties were given.    The patient is appropriate for an endoscopic procedure in the ambulatory setting.   - Wilfrid Lund, MD

## 2020-12-10 NOTE — Op Note (Signed)
[name] Procedure Date: 12/10/2020 9:56 AM MRN: [id number] Endoscopist: Mallie Mussel L. Loletha Carrow , MD Age: 55 Referring MD:  Date of Birth: 10/15/1965 Gender: Female Account #: [id number] Procedure:                Colonoscopy Indications:              Screening for colorectal malignant neoplasm, This                            is the patient's first colonoscopy Medicines:                Monitored Anesthesia Care Procedure:                Pre-Anesthesia Assessment:                           - Prior to the procedure, a History and Physical                            was performed, and patient medications and                            allergies were reviewed. The patient's tolerance of                            previous anesthesia was also reviewed. The risks                            and benefits of the procedure and the sedation                            options and risks were discussed with the patient.                            All questions were answered, and informed consent                            was obtained. Prior Anticoagulants: The patient has                            taken no previous anticoagulant or antiplatelet                            agents. ASA Grade Assessment: III - A patient with                            severe systemic disease. After reviewing the risks                            and benefits, the patient was deemed in                            satisfactory condition to undergo the procedure.  After obtaining informed consent, the colonoscope                            was passed under direct vision. Throughout the                            procedure, the patient's blood pressure, pulse, and                            oxygen saturations were monitored continuously. The                            Olympus CF-HQ190L [id number]) Colonoscope was                            introduced through the anus  and advanced to the the                            cecum, identified by appendiceal orifice and                            ileocecal valve. The colonoscopy was performed                            without difficulty. The patient tolerated the                            procedure well. The quality of the bowel                            preparation was excellent. The ileocecal valve,                            appendiceal orifice, and rectum were photographed.                            The bowel preparation used was Plenvu. Scope In: 10:16:54 AM Scope Out: 10:31:58 AM Scope Withdrawal Time: 0 hours 11 minutes 45 seconds  Total Procedure Duration: 0 hours 15 minutes 4 seconds  Findings:                 The perianal and digital rectal examinations were                            normal.                           A diminutive polyp was found in the distal                            transverse colon. The polyp was semi-sessile. The                            polyp was removed with a cold snare. Resection and  retrieval were complete.                           A few small-mouthed diverticula were found in the                            left colon.                           Internal hemorrhoids were found.                           The exam was otherwise without abnormality on                            direct and retroflexion views. Complications:            No immediate complications. Estimated Blood Loss:     Estimated blood loss was minimal. Impression:               - One diminutive polyp in the distal transverse                            colon, removed with a cold snare. Resected and                            retrieved.                           - Diverticulosis in the left colon.                           - Internal hemorrhoids.                           - The examination was otherwise normal on direct                            and retroflexion  views. Recommendation:           - Patient has a contact number available for                            emergencies. The signs and symptoms of potential                            delayed complications were discussed with the                            patient. Return to normal activities tomorrow.                            Written discharge instructions were provided to the                            patient.                           -  Resume previous diet.                           - Continue present medications.                           - Await pathology results.                           - Repeat colonoscopy is recommended for                            surveillance. The colonoscopy date will be                            determined after pathology results from today's                            exam become available for review. Lamoine Fredricksen L. Loletha Carrow, MD 12/10/2020 10:35:14 AM This report has been signed electronically.

## 2020-12-10 NOTE — Patient Instructions (Signed)
Handouts provided on polyps, diverticulosis and hemorrhoids.   YOU HAD AN ENDOSCOPIC PROCEDURE TODAY AT THE [facility]:   Refer to the procedure report that was given to you for any specific questions about what was found during the examination.  If the procedure report does not answer your questions, please call your gastroenterologist to clarify.  If you requested that your care partner not be given the details of your procedure findings, then the procedure report has been included in a sealed envelope for you to review at your convenience later.  YOU SHOULD EXPECT: Some feelings of bloating in the abdomen. Passage of more gas than usual.  Walking can help get rid of the air that was put into your GI tract during the procedure and reduce the bloating. If you had a lower endoscopy (such as a colonoscopy or flexible sigmoidoscopy) you may notice spotting of blood in your stool or on the toilet paper. If you underwent a bowel prep for your procedure, you may not have a normal bowel movement for a few days.  Please Note:  You might notice some irritation and congestion in your nose or some drainage.  This is from the oxygen used during your procedure.  There is no need for concern and it should clear up in a day or so.  SYMPTOMS TO REPORT IMMEDIATELY:   Following lower endoscopy (colonoscopy or flexible sigmoidoscopy):  Excessive amounts of blood in the stool  Significant tenderness or worsening of abdominal pains  Swelling of the abdomen that is new, acute  Fever of 100F or higher   For urgent or emergent issues, a gastroenterologist can be reached at any hour by calling [phone]. Do not use MyChart messaging for urgent concerns.    DIET:  We do recommend a small meal at first, but then you may proceed to your regular diet.  Drink plenty of fluids but you should avoid alcoholic beverages for 24 hours.  ACTIVITY:  You should plan to take it easy for the rest of today and  you should NOT DRIVE or use heavy machinery until tomorrow (because of the sedation medicines used during the test).    FOLLOW UP: Our staff will call the number listed on your records 48-72 hours following your procedure to check on you and address any questions or concerns that you may have regarding the information given to you following your procedure. If we do not reach you, we will leave a message.  We will attempt to reach you two times.  During this call, we will ask if you have developed any symptoms of COVID 19. If you develop any symptoms (ie: fever, flu-like symptoms, shortness of breath, cough etc.) before then, please call [phone].  If you test positive for Covid 19 in the 2 weeks post procedure, please call and report this information to us.    If any biopsies were taken you will be contacted by phone or by letter within the next 1-3 weeks.  Please call us at [phone] if you have not heard about the biopsies in 3 weeks.    SIGNATURES/CONFIDENTIALITY: You and/or your care partner have signed paperwork which will be entered into your electronic medical record.  These signatures attest to the fact that that the information above on your After Visit Summary has been reviewed and is understood.  Full responsibility of the confidentiality of this discharge information lies with you and/or your care-partner.  

## 2020-12-10 NOTE — Progress Notes (Signed)
Nad vss transferred to pacu

## 2020-12-10 NOTE — Progress Notes (Signed)
Pt's states no medical or surgical changes since previsit or office visit. 

## 2020-12-10 NOTE — Progress Notes (Signed)
D.T. vital signs. °

## 2020-12-11 ENCOUNTER — Ambulatory Visit: Payer: 59 | Admitting: Family Medicine

## 2020-12-11 ENCOUNTER — Other Ambulatory Visit (HOSPITAL_BASED_OUTPATIENT_CLINIC_OR_DEPARTMENT_OTHER): Payer: Self-pay

## 2020-12-11 VITALS — BP 122/80 | HR 63 | Ht 62.0 in | Wt 191.0 lb

## 2020-12-11 DIAGNOSIS — M25552 Pain in left hip: Secondary | ICD-10-CM

## 2020-12-11 HISTORY — DX: Pain in left hip: M25.552

## 2020-12-11 MED ORDER — VITAMIN D (ERGOCALCIFEROL) 1.25 MG (50000 UNIT) PO CAPS
50000.0000 [IU] | ORAL_CAPSULE | ORAL | 0 refills | Status: DC
  Filled 2020-12-11: qty 4, 28d supply, fill #0
  Filled 2021-02-08: qty 4, 28d supply, fill #1

## 2020-12-11 NOTE — Patient Instructions (Addendum)
[facility]-[place] will call you We will be in touch with results

## 2020-12-11 NOTE — Assessment & Plan Note (Signed)
Patient is having increasing limitation of the range of motion at the moment.  Differential includes frozen hip, labral pathology or worsening of the hip arthritis.  Patient is doing formal physical therapy at this point, home exercises, as well as the oral anti-inflammatories.  Patient has tried a greater trochanteric injection with no significant benefit.  On exam today patient has had significant decrease in range of motion.  Discussed with patient about icing regimen and home exercises.  Discussed which activities to do which wants to avoid.  Follow-up with me again for his chronic problem with exacerbation after imaging to discuss further.

## 2020-12-12 ENCOUNTER — Telehealth: Payer: Self-pay

## 2020-12-12 NOTE — Telephone Encounter (Signed)
  Follow up Call-  Call back number 12/10/2020  Post procedure Call Back phone  # [phone]  Permission to leave phone message Yes  Some recent data might be hidden     Patient questions:  Do you have a fever, pain , or abdominal swelling? No. Pain Score  0 *  Have you tolerated food without any problems? Yes.    Have you been able to return to your normal activities? Yes.    Do you have any questions about your discharge instructions: Diet   No. Medications  No. Follow up visit  No.  Do you have questions or concerns about your Care? NO  Actions: * If pain score is 4 or above: No action needed, pain <4.

## 2020-12-13 ENCOUNTER — Encounter: Payer: Self-pay | Admitting: Family Medicine

## 2020-12-14 ENCOUNTER — Encounter: Payer: Self-pay | Admitting: Gastroenterology

## 2020-12-16 MED FILL — Rosuvastatin Calcium Tab 10 MG: ORAL | 30 days supply | Qty: 30 | Fill #7 | Status: AC

## 2020-12-17 ENCOUNTER — Other Ambulatory Visit (HOSPITAL_BASED_OUTPATIENT_CLINIC_OR_DEPARTMENT_OTHER): Payer: Self-pay

## 2020-12-24 ENCOUNTER — Ambulatory Visit (INDEPENDENT_AMBULATORY_CARE_PROVIDER_SITE_OTHER): Payer: 59

## 2020-12-24 ENCOUNTER — Ambulatory Visit: Payer: 59 | Admitting: Sports Medicine

## 2020-12-24 ENCOUNTER — Other Ambulatory Visit: Payer: Self-pay

## 2020-12-24 DIAGNOSIS — M7062 Trochanteric bursitis, left hip: Secondary | ICD-10-CM | POA: Diagnosis not present

## 2020-12-24 DIAGNOSIS — M25552 Pain in left hip: Secondary | ICD-10-CM | POA: Diagnosis not present

## 2020-12-24 DIAGNOSIS — G8929 Other chronic pain: Secondary | ICD-10-CM | POA: Diagnosis not present

## 2020-12-24 DIAGNOSIS — M25652 Stiffness of left hip, not elsewhere classified: Secondary | ICD-10-CM

## 2020-12-24 MED ORDER — GADOBUTROL 1 MMOL/ML IV SOLN
1.0000 mL | Freq: Once | INTRAVENOUS | Status: AC | PRN
  Administered 2020-12-24: 12:00:00 1 mL via INTRAVENOUS

## 2020-12-24 NOTE — Progress Notes (Signed)
    Procedures performed today:    Procedure: Real-time Ultrasound Guided gadolinium contrast injection of left hip joint Device: Samsung HS60  Verbal informed consent obtained.  Time-out conducted.  Noted no overlying erythema, induration, or other signs of local infection.  Skin prepped in a sterile fashion.  Local anesthesia: Topical Ethyl chloride.  With sterile technique and under real time ultrasound guidance: Noted arthritic joint, 22-gauge spinal needle advanced to the femoral head/neck junction and contacted bone, I then injected 1 cc kenalog 40, 2 cc lidocaine, 2 cc bupivacaine, syringe switched and 0.1 cc gadolinium injected, syringe again switched and 5 cc sterile saline used to distend the joint, the patient reported significant pain at 5 cc so we did not go to the full 10 cc of sterile saline. Joint visualized and capsule seen distending confirming intra-articular placement of contrast material and medication. Completed without difficulty  Advised to call if fevers/chills, erythema, induration, drainage, or persistent bleeding.  Images permanently stored in PACS Impression: Technically successful ultrasound guided gadolinium contrast injection for MR arthrography.  Please see separate MR arthrogram report.  Independent interpretation of notes and tests performed by another provider:   None.  Brief History, Exam, Impression, and Recommendations:    Left hip pain This is a very pleasant 55 year old female, lungs.  Left hip pain, localized laterally and in the groin, on exam there is significant loss of internal rotation on the left compared to the right consistent with hip osteoarthritis, x-rays confirm the same, referred to me for hip arthrogram. Today we performed a hip arthrogram injection with steroid, for MR arthrography.   Further management per primary treating provider. I do think the steroid effect from the arthrogram injection will provide good long-term relief but  she does understand that hip arthroplasty may be necessary in the future.    ___________________________________________ Gwen Her. Dianah Field, M.D., ABFM., CAQSM. Primary Care and [employer] of [place] of [facility] of [facility]

## 2020-12-24 NOTE — Assessment & Plan Note (Signed)
This is a very pleasant 55 year old female, lungs.  Left hip pain, localized laterally and in the groin, on exam there is significant loss of internal rotation on the left compared to the right consistent with hip osteoarthritis, x-rays confirm the same, referred to me for hip arthrogram. Today we performed a hip arthrogram injection with steroid, for MR arthrography.   Further management per primary treating provider. I do think the steroid effect from the arthrogram injection will provide good long-term relief but she does understand that hip arthroplasty may be necessary in the future.

## 2020-12-25 ENCOUNTER — Encounter: Payer: Self-pay | Admitting: Family Medicine

## 2020-12-25 ENCOUNTER — Other Ambulatory Visit: Payer: Self-pay

## 2020-12-25 DIAGNOSIS — M25552 Pain in left hip: Secondary | ICD-10-CM

## 2021-01-02 ENCOUNTER — Other Ambulatory Visit (HOSPITAL_BASED_OUTPATIENT_CLINIC_OR_DEPARTMENT_OTHER): Payer: Self-pay

## 2021-01-08 NOTE — Progress Notes (Signed)
Stephanie Castro [place] Phone: [phone] Subjective:   Stephanie Castro, am serving as a scribe for Stephanie Castro.  This visit occurred during the SARS-CoV-2 public health emergency.  Safety protocols were in place, including screening questions prior to the visit, additional usage of staff PPE, and extensive cleaning of exam room while observing appropriate contact time as indicated for disinfecting solutions.   I'm seeing this patient by the request  of:  Stephanie Pal, DO  CC: left hip   DTO:[id number]  12/11/2020 Patient is having increasing limitation of the range of motion at the moment.  Differential includes frozen hip, labral pathology or worsening of the hip arthritis.  Patient is doing formal physical therapy at this point, home exercises, as well as the oral anti-inflammatories.  Patient has tried a greater trochanteric injection with Castro significant benefit.  On exam today patient has had significant decrease in range of motion.  Discussed with patient about icing regimen and home exercises.  Discussed which activities to do which wants to avoid.  Follow-up with me again for his chronic problem with exacerbation after imaging to discuss further.  Update 01/09/2021 Stephanie Castro is a 55 y.o. female coming in with complaint of L hip pain. Patient states  Sent to ortho to discuss, Stephanie Castro. Has not met with Stephanie Castro yet but will on January 6th. Pain continues over greater trochanter that is constant. Castro worse but not any better.   MRI 12/24/2020 L hip-   IMPRESSION: 1. Moderate osteoarthritis of the left hip. 2. Left superior labral degeneration and maceration of the anterior labrum.     Past Medical History:  Diagnosis Date   Arthritis    both knees , back and elbows   BMI 35.0-35.9,adult 05/15/2013   Breast cancer screening, high risk patient 04/04/2013   Pt states she had mmg 2015 nml results,  ordered by Dr  Stephanie Castro, [facility] health care. Results are not in Care everywhere. Mammogram completed 06/12/2015 at [facility] in [place], [place] Category 1    Cardiac murmur 10/19/2019   Chronic pain of right knee 04/13/2020   Class 2 severe obesity due to excess calories with serious comorbidity and body mass index (BMI) of 35.0 to 35.9 in adult [facility]) 09/14/2020   Concern about female breast disease without diagnosis 03/17/2018   DOE (dyspnea on exertion) 08/08/2015   Dysplastic nevus    beneath r eye   Edema of extremities 07/07/2013   Last Assessment & Plan:  Pt takes HCTZ daily.   Last Assessment & Plan:  Formatting of this note might be different from the original. Pt takes HCTZ daily.   Elevated BP without diagnosis of hypertension 06/15/2015   Elevated uric acid in blood 05/02/2020   Essential hypertension 02/26/2017   Exercise-induced asthma    Fatigue 07/06/2013   Last Assessment & Plan:  Will review previous records from previous PCP, pt had thyroid done recently and was wnl. Pt had questions re lupus/MS and how to get tested for these.   Last Assessment & Plan:  Formatting of this note might be different from the original. Will review previous records from previous PCP, pt had thyroid done recently and was wnl. Pt had questions re lupus/MS and how to get    GERD (gastroesophageal reflux disease)    Greater trochanteric bursitis of left hip 08/30/2020   Injection given  August 30, 2020   Hypertension    Lateral epicondylitis of both elbows 08/06/2018   Left knee pain 08/30/2020   Low TSH level 04/22/2013   Mixed dyslipidemia 02/22/2020   Myalgia 07/06/2013   Last Assessment & Plan:  Formatting of this note might be different from the original. Will check labs today including sed rate, ANA/RF.  Last Assessment & Plan:  Formatting of this note might be different from the original. Pt would like to be tested for lupus in the  future. Has been tested for RA and was negative   Other and unspecified hyperlipidemia 04/06/2011   Palpitations 10/19/2019   Perimenopausal 05/15/2013   Periodic edema 04/04/2013   Pt reports taking "water pill" for many years due to feeling "swollen". Historically, took HCTZ but became hypokalemic. She was switched to triamterene-HCTZ with Castro more hypokalemic episodes.     Preventative health care 04/04/2013   Primary localized osteoarthritis of right knee 10/13/2014   Squamous cell carcinoma, face 02/24/2014   Tear of medial meniscus of right knee 10/13/2014   Vitamin B12 deficiency 05/15/2013   Last Assessment & Plan:  Formatting of this note might be different from the original. Recheck level today, may need to restart monthly Vit B12 shots   Vitamin D deficiency 02/24/2014   Weight gain 04/24/2015   Past Surgical History:  Procedure Laterality Date   ABDOMINAL HYSTERECTOMY  2007   KNEE ARTHROSCOPY WITH MEDIAL MENISECTOMY Right 10/13/2014   Procedure: RIGHT KNEE ARTHROSCOPY WITH PARTIAL MEDIAL MENISECTOMY AND CHONDROPLASTY ;  Surgeon: Stephanie Bond, MD;  Location: [facility];  Service: Orthopedics;  Laterality: Right;   Social History   Socioeconomic History   Marital status: Married    Spouse name: Not on file   Number of children: Not on file   Years of education: Not on file   Highest education level: Not on file  Occupational History   Not on file  Tobacco Use   Smoking status: Former    Packs/day: 1.00    Years: 20.00    Pack years: 20.00    Types: Cigarettes    Quit date: 2005    Years since quitting: 17.9   Smokeless tobacco: Never  Vaping Use   Vaping Use: Never used  Substance and Sexual Activity   Alcohol use: Yes    Alcohol/week: 2.0 standard drinks    Types: 1 Glasses of wine, 1 Shots of liquor per week   Drug use: Never   Sexual activity: Yes    Birth control/protection: Surgical  Other Topics Concern   Not on file  Social History Narrative    Married to Stephanie Castro.    Social Determinants of Health   Financial Resource Strain: Not on file  Food Insecurity: Not on file  Transportation Needs: Not on file  Physical Activity: Not on file  Stress: Not on file  Social Connections: Not on file   Allergies  Allergen Reactions   Penicillins    Family History  Problem Relation Age of Onset   Colon polyps Mother    Hyperlipidemia Mother    Breast cancer Mother 17       breast   Heart attack Father    COPD Father    Colon polyps Sister    Heart attack Sister    Hyperlipidemia Sister    Hypertension Sister    Heart attack Sister    Hyperlipidemia Maternal Aunt    Breast cancer Maternal Aunt  breast   Hypertension Maternal Uncle    Diabetes Maternal Uncle    Colon cancer Maternal Uncle        colon   Hyperlipidemia Maternal Grandmother    Diabetes Maternal Grandmother    Breast cancer Maternal Grandmother        breast   Diabetes Maternal Grandfather    Sudden death Neg Hx    Esophageal cancer Neg Hx    Rectal cancer Neg Hx    Stomach cancer Neg Hx      Current Outpatient Medications (Cardiovascular):    olmesartan (BENICAR) 20 MG tablet, Take 1/2 tablet (10 mg total) by mouth daily.   rosuvastatin (CRESTOR) 10 MG tablet, TAKE 1 TABLET BY MOUTH ONCE DAILY   Current Outpatient Medications (Analgesics):    aspirin EC 81 MG tablet, Take 1 tablet (81 mg total) by mouth daily. Swallow whole.   meloxicam (MOBIC) 7.5 MG tablet, TAKE 1 TABLET BY MOUTH ONCE DAILY  Current Outpatient Medications (Hematological):    cyanocobalamin (,VITAMIN B-12,) 1000 MCG/ML injection, INJECT 1ML INTO THE SKIN EVERY 2 WEEKS  Current Outpatient Medications (Other):    Diclofenac Sodium (PENNSAID) 2 % SOLN, Place 1 application onto the skin 2 (two) times daily.   ergocalciferol (VITAMIN D2) 1.25 MG (50000 UT) capsule, Take 50,000 Units by mouth once a week.   phentermine (ADIPEX-P) 37.5 MG tablet, Take 1 tablet (37.5 mg total) by mouth  daily before breakfast.   phentermine (ADIPEX-P) 37.5 MG tablet, Take 1 tablet (37.5 mg total) by mouth daily before breakfast.   phentermine (ADIPEX-P) 37.5 MG tablet, Take 1 tablet (37.5 mg total) by mouth daily before breakfast.   Vitamin D, Ergocalciferol, (DRISDOL) 1.25 MG (50000 UNIT) CAPS capsule, Take 1 capsule (50,000 Units total) by mouth every 7 (seven) days.   Reviewed prior external information including notes and imaging from  primary care provider As well as notes that were available from care everywhere and other healthcare systems.  Past medical history, social, surgical and family history all reviewed in electronic medical record.  Castro pertanent information unless stated regarding to the chief complaint.   Review of Systems:  Castro headache, visual changes, nausea, vomiting, diarrhea, constipation, dizziness, abdominal pain, skin rash, fevers, chills, night sweats, weight loss, swollen lymph nodes, body aches, joint swelling, chest pain, shortness of breath, mood changes. POSITIVE muscle aches  Objective  Blood pressure 108/74, pulse 69, height $RemoveBe'5\' 2"'YNawWtdlh$  (1.575 m), weight 191 lb (86.6 kg), SpO2 98 %.   General: Castro apparent distress alert and oriented x3 mood and affect normal, dressed appropriately.  HEENT: Pupils equal, extraocular movements intact  Respiratory: Patient's speak in full sentences and does not appear short of breath  Cardiovascular: Castro lower extremity edema, non tender, Castro erythema  Gait normal with good balance and coordination.  MSK: Left hip exam shows the patient does have tenderness to palpation over the greater trochanteric area as well as the gluteal area on the left side.  Patient does have some limitation with internal rotation of the hip.  Patient is not having any groin pain on exam today.   Procedure: Real-time Ultrasound Guided Injection of left  greater trochanteric bursitis secondary to patient's body habitus Device: GE Logiq Q7  Ultrasound guided  injection is preferred based studies that show increased duration, increased effect, greater accuracy, decreased procedural pain, increased response rate, and decreased cost with ultrasound guided versus blind injection.  Verbal informed consent obtained.  Time-out conducted.  Noted Castro overlying erythema, induration,  or other signs of local infection.  Skin prepped in a sterile fashion.  Local anesthesia: Topical Ethyl chloride.  With sterile technique and under real time ultrasound guidance:  Greater trochanteric area was visualized and patient's bursa was noted. A 22-gauge 3 inch needle was inserted and 4 cc of 0.5% Marcaine and 1 cc of Kenalog 40 mg/dL was injected. Pictures taken Completed without difficulty  Pain immediately resolved suggesting accurate placement of the medication.  Advised to call if fevers/chills, erythema, induration, drainage, or persistent bleeding.   Impression: Technically successful ultrasound guided injection.    Impression and Recommendations:     The above documentation has been reviewed and is accurate and complete Lyndal Pulley, DO

## 2021-01-09 ENCOUNTER — Ambulatory Visit: Payer: BC Managed Care – PPO | Admitting: Family Medicine

## 2021-01-09 ENCOUNTER — Ambulatory Visit: Payer: Self-pay

## 2021-01-09 ENCOUNTER — Encounter: Payer: Self-pay | Admitting: Family Medicine

## 2021-01-09 ENCOUNTER — Other Ambulatory Visit: Payer: Self-pay

## 2021-01-09 VITALS — BP 108/74 | HR 69 | Ht 62.0 in | Wt 191.0 lb

## 2021-01-09 DIAGNOSIS — S73192A Other sprain of left hip, initial encounter: Secondary | ICD-10-CM | POA: Diagnosis not present

## 2021-01-09 DIAGNOSIS — M7062 Trochanteric bursitis, left hip: Secondary | ICD-10-CM

## 2021-01-09 NOTE — Assessment & Plan Note (Signed)
Repeat injection given today, tolerated the procedure well, discussed icing regimen and home exercises.  We will send patient for a second opinion due toPatient does have a labral pathology and is seeing .

## 2021-01-09 NOTE — Patient Instructions (Addendum)
Injected posterior GT today Referral to [facility] this will be helpful See me in 7-8 weeks

## 2021-01-21 ENCOUNTER — Other Ambulatory Visit: Payer: Self-pay | Admitting: Family Medicine

## 2021-01-21 DIAGNOSIS — M25561 Pain in right knee: Secondary | ICD-10-CM

## 2021-01-21 MED FILL — Rosuvastatin Calcium Tab 10 MG: ORAL | 30 days supply | Qty: 30 | Fill #8 | Status: AC

## 2021-01-22 ENCOUNTER — Other Ambulatory Visit (HOSPITAL_BASED_OUTPATIENT_CLINIC_OR_DEPARTMENT_OTHER): Payer: Self-pay

## 2021-01-22 MED ORDER — MELOXICAM 7.5 MG PO TABS
ORAL_TABLET | Freq: Every day | ORAL | 2 refills | Status: DC
  Filled 2021-01-22: qty 30, 30d supply, fill #0
  Filled 2021-02-26: qty 30, 30d supply, fill #1

## 2021-01-29 ENCOUNTER — Encounter: Payer: Self-pay | Admitting: Family Medicine

## 2021-01-29 DIAGNOSIS — M25552 Pain in left hip: Secondary | ICD-10-CM | POA: Diagnosis not present

## 2021-02-05 DIAGNOSIS — M1612 Unilateral primary osteoarthritis, left hip: Secondary | ICD-10-CM | POA: Diagnosis not present

## 2021-02-06 ENCOUNTER — Telehealth: Payer: Self-pay | Admitting: Family Medicine

## 2021-02-06 ENCOUNTER — Encounter: Payer: Self-pay | Admitting: Cardiology

## 2021-02-06 ENCOUNTER — Encounter: Payer: Self-pay | Admitting: Family Medicine

## 2021-02-06 ENCOUNTER — Telehealth: Payer: Self-pay | Admitting: Cardiology

## 2021-02-06 NOTE — Telephone Encounter (Signed)
She sent a my chart message

## 2021-02-06 NOTE — Telephone Encounter (Signed)
° °  Pre-operative Risk Assessment    Patient Name: Stephanie Castro  DOB: 14-Aug-1965 MRN: [id number]      Request for Surgical Clearance    Procedure:  L Total Hip Arthroplasty  Date of Surgery:  Clearance TBD                                 Surgeon:  Dr. Dot Lanes Surgeon's Group or Practice Name: [facility]  Phone number:  [phone] Fax number:  [phone]   Type of Clearance Requested:   - Medical  - Pharmacy:  Hold Aspirin TBD by Cardiology   Type of Anesthesia:  Spinal   Additional requests/questions:    Rosalyn Gess   02/06/2021, 12:07 PM

## 2021-02-06 NOTE — Telephone Encounter (Signed)
Dr. Geraldo Pitter, please review, patient does not have any prior history of CAD.  During the last office visit in November, her Benicar was stopped due to hypotension.  She has been doing well since without any cardiac concerns.  She denies any chest pain or worsening dyspnea.  She has significant hip pain that prevent her from walking long distance.  She does Pilates about 2-3 times a week.  From your perspective, would you recommend any additional evaluation prior to her final clearance?  Please forward your response to P CV DIV PREOP

## 2021-02-06 NOTE — Telephone Encounter (Signed)
Also note, patient says she has not taken any aspirin for the past 6 month

## 2021-02-06 NOTE — Telephone Encounter (Signed)
[facility] called to confirm a clearance form faxed to [place] was received. Form is located in Dr. Irene Limbo S:Drive.

## 2021-02-06 NOTE — Telephone Encounter (Signed)
Pt contacted ov regarding documents that were faxed over from Dr. Rod Can for surgical clearance. Pt would like confirmation that documents were received via fax. Telephone or MyChart message is okay. Please advise.

## 2021-02-06 NOTE — Telephone Encounter (Signed)
Called the patient and scheduled appt surgical clearance for 02/08/21.

## 2021-02-06 NOTE — Telephone Encounter (Signed)
Error. No encounter needed

## 2021-02-06 NOTE — Telephone Encounter (Signed)
Form received and is in PCP's folder The patient is scheduled with PCP 02/08/21 at 4 PM

## 2021-02-07 NOTE — Telephone Encounter (Signed)
Per Dr. Geraldo Pitter "Best to be seen in appointment and evaluated by her primary care doctor for the restratification.  I will be glad to see her for this purpose if necessary also."   Patient does not have prior history of CAD. She is scheduled to see Dr. Nani Ravens, her PCP tomorrow 1/13, if felt cardiology clearance is needed, please let us know so we can arrange follow up with Dr. Geraldo Pitter.

## 2021-02-07 NOTE — Telephone Encounter (Signed)
Will fax notes to requesting to see notes from pre op provider and Dr. Geraldo Pitter.

## 2021-02-08 ENCOUNTER — Encounter: Payer: Self-pay | Admitting: Family Medicine

## 2021-02-08 ENCOUNTER — Other Ambulatory Visit (HOSPITAL_BASED_OUTPATIENT_CLINIC_OR_DEPARTMENT_OTHER): Payer: Self-pay

## 2021-02-08 ENCOUNTER — Ambulatory Visit: Payer: BC Managed Care – PPO | Admitting: Family Medicine

## 2021-02-08 VITALS — BP 114/70 | HR 64 | Temp 97.6°F | Resp 18 | Ht 61.5 in | Wt 192.4 lb

## 2021-02-08 DIAGNOSIS — Z01818 Encounter for other preprocedural examination: Secondary | ICD-10-CM | POA: Diagnosis not present

## 2021-02-08 DIAGNOSIS — R739 Hyperglycemia, unspecified: Secondary | ICD-10-CM | POA: Diagnosis not present

## 2021-02-08 NOTE — Patient Instructions (Signed)
Give [place] 2-3 business days to get the results of your labs back.

## 2021-02-08 NOTE — Progress Notes (Signed)
Subjective:   Chief Complaint  Patient presents with   surgical clearance    Pending March 3rd Left hip. Sees Cardiology    Stephanie Castro  is here for a Pre-operative physical at the request of Dr. Rod Can.   She  is having hip arthroplasty surgery on 03/29/21 for hip arthritis.  Personal or family hx of adverse outcome to anesthesia? No  Chipped, cracked, missing, or loose teeth? Yes, missing molars Decreased ROM of neck? No  Able to walk up 2 flights of stairs without becoming significantly short of breath or having chest pain? Yes   Revised Goldman Criteria: High Risk Surgery (intraperitoneal, intrathoracic, aortic): No  Ischemic heart disease (Prior MI, +excercise stress test, angina, nitrate use, Qwave): No  History of heart failure: No  History of cerebrovascular disease: No  History of diabetes: No  Insulin therapy for DM: No  Preoperative Cr >2.0: No   Patient Active Problem List   Diagnosis Date Noted   Left hip pain 12/11/2020   Obesity (BMI 35.0-39.9 without comorbidity) 11/30/2020   Class 2 severe obesity due to excess calories with serious comorbidity and body mass index (BMI) of 35.0 to 35.9 in adult ([place]) 09/14/2020   Greater trochanteric bursitis of left hip 08/30/2020   Left knee pain 08/30/2020   Elevated uric acid in blood 05/02/2020   Chronic pain of right knee 04/13/2020   Mixed dyslipidemia 02/22/2020   Arthritis    Dysplastic nevus    Hypertension    Palpitations 10/19/2019   Cardiac murmur 10/19/2019   Lateral epicondylitis of both elbows 08/06/2018   Concern about female breast disease without diagnosis 03/17/2018   Essential hypertension 02/26/2017   Exercise-induced asthma    DOE (dyspnea on exertion) 08/08/2015   Elevated BP without diagnosis of hypertension 06/15/2015   Weight gain 04/24/2015   Primary localized osteoarthritis of right knee 10/13/2014   Tear of medial meniscus of right knee 10/13/2014   Squamous cell carcinoma, face 02/24/2014    Vitamin D deficiency 02/24/2014   Edema of extremities 07/07/2013   Fatigue 07/06/2013   GERD (gastroesophageal reflux disease) 07/06/2013   Myalgia 07/06/2013   Vitamin B12 deficiency 05/15/2013   BMI 35.0-35.9,adult 05/15/2013   Perimenopausal 05/15/2013   Low TSH level 04/22/2013   Breast cancer screening, high risk patient 04/04/2013   Preventative health care 04/04/2013   Periodic edema 04/04/2013   Other and unspecified hyperlipidemia 04/06/2011   Past Medical History:  Diagnosis Date   Arthritis    both knees , back and elbows   BMI 35.0-35.9,adult 05/15/2013   Breast cancer screening, high risk patient 04/04/2013   Pt states she had mmg 2015 nml results, ordered by Dr  Ruben Gottron, [facility] health care. Results are not in Care everywhere. Mammogram completed 06/12/2015 at [facility] in [place], [place] Category 1    Cardiac murmur 10/19/2019   Chronic pain of right knee 04/13/2020   Class 2 severe obesity due to excess calories with serious comorbidity and body mass index (BMI) of 35.0 to 35.9 in adult [facility]) 09/14/2020   Concern about female breast disease without diagnosis 03/17/2018   DOE (dyspnea on exertion) 08/08/2015   Dysplastic nevus    beneath r eye   Edema of extremities 07/07/2013   Last Assessment & Plan:  Pt takes HCTZ daily.   Last Assessment & Plan:  Formatting of this note might be different from the original. Pt takes HCTZ daily.  Elevated BP without diagnosis of hypertension 06/15/2015   Elevated uric acid in blood 05/02/2020   Essential hypertension 02/26/2017   Exercise-induced asthma    Fatigue 07/06/2013   Last Assessment & Plan:  Will review previous records from previous PCP, pt had thyroid done recently and was wnl. Pt had questions re lupus/MS and how to get tested for these.   Last Assessment & Plan:  Formatting of this note might be different from the original. Will review previous  records from previous PCP, pt had thyroid done recently and was wnl. Pt had questions re lupus/MS and how to get    GERD (gastroesophageal reflux disease)    Greater trochanteric bursitis of left hip 08/30/2020   Injection given August 30, 2020   Hypertension    Lateral epicondylitis of both elbows 08/06/2018   Left knee pain 08/30/2020   Low TSH level 04/22/2013   Mixed dyslipidemia 02/22/2020   Myalgia 07/06/2013   Last Assessment & Plan:  Formatting of this note might be different from the original. Will check labs today including sed rate, ANA/RF.  Last Assessment & Plan:  Formatting of this note might be different from the original. Pt would like to be tested for lupus in the future. Has been tested for RA and was negative   Other and unspecified hyperlipidemia 04/06/2011   Palpitations 10/19/2019   Perimenopausal 05/15/2013   Periodic edema 04/04/2013   Pt reports taking "water pill" for many years due to feeling "swollen". Historically, took HCTZ but became hypokalemic. She was switched to triamterene-HCTZ with no more hypokalemic episodes.     Preventative health care 04/04/2013   Primary localized osteoarthritis of right knee 10/13/2014   Squamous cell carcinoma, face 02/24/2014   Tear of medial meniscus of right knee 10/13/2014   Vitamin B12 deficiency 05/15/2013   Last Assessment & Plan:  Formatting of this note might be different from the original. Recheck level today, may need to restart monthly Vit B12 shots   Vitamin D deficiency 02/24/2014   Weight gain 04/24/2015    Past Surgical History:  Procedure Laterality Date   ABDOMINAL HYSTERECTOMY  2007   KNEE ARTHROSCOPY WITH MEDIAL MENISECTOMY Right 10/13/2014   Procedure: RIGHT KNEE ARTHROSCOPY WITH PARTIAL MEDIAL MENISECTOMY AND CHONDROPLASTY ;  Surgeon: Marchia Bond, MD;  Location: [facility];  Service: Orthopedics;  Laterality: Right;    Current Outpatient Medications  Medication Sig Dispense Refill   aspirin EC 81 MG tablet  Take 1 tablet (81 mg total) by mouth daily. Swallow whole. 90 tablet 3   cyanocobalamin (,VITAMIN B-12,) 1000 MCG/ML injection INJECT 1ML INTO THE SKIN EVERY 2 WEEKS 10 mL 0   Diclofenac Sodium (PENNSAID) 2 % SOLN Place 1 application onto the skin 2 (two) times daily. 112 g 3   ergocalciferol (VITAMIN D2) 1.25 MG (50000 UT) capsule Take 50,000 Units by mouth once a week.     meloxicam (MOBIC) 7.5 MG tablet TAKE 1 TABLET BY MOUTH ONCE DAILY 30 tablet 2   olmesartan (BENICAR) 20 MG tablet Take 1/2 tablet (10 mg total) by mouth daily. 45 tablet 1   phentermine (ADIPEX-P) 37.5 MG tablet Take 1 tablet (37.5 mg total) by mouth daily before breakfast. 30 tablet 0   phentermine (ADIPEX-P) 37.5 MG tablet Take 1 tablet (37.5 mg total) by mouth daily before breakfast. 30 tablet 0   phentermine (ADIPEX-P) 37.5 MG tablet Take 1 tablet (37.5 mg total) by mouth daily before breakfast. 30 tablet 0  rosuvastatin (CRESTOR) 10 MG tablet TAKE 1 TABLET BY MOUTH ONCE DAILY 90 tablet 3   Vitamin D, Ergocalciferol, (DRISDOL) 1.25 MG (50000 UNIT) CAPS capsule Take 1 capsule (50,000 Units total) by mouth every 7 (seven) days. 12 capsule 0   Allergies  Allergen Reactions   Penicillins     Family History  Problem Relation Age of Onset   Colon polyps Mother    Hyperlipidemia Mother    Breast cancer Mother 58       breast   Heart attack Father    COPD Father    Colon polyps Sister    Heart attack Sister    Hyperlipidemia Sister    Hypertension Sister    Heart attack Sister    Hyperlipidemia Maternal Aunt    Breast cancer Maternal Aunt        breast   Hypertension Maternal Uncle    Diabetes Maternal Uncle    Colon cancer Maternal Uncle        colon   Hyperlipidemia Maternal Grandmother    Diabetes Maternal Grandmother    Breast cancer Maternal Grandmother        breast   Diabetes Maternal Grandfather    Sudden death Neg Hx    Esophageal cancer Neg Hx    Rectal cancer Neg Hx    Stomach cancer Neg Hx       Review of Systems:  Constitutional:  no fevers Eye:  no recent significant change in vision Ear:  no hearing loss Nose/Mouth/Throat:  No new dental complaints Neck/Thyroid:  no lumps or masses Pulmonary:  No shortness of breath Cardiovascular:  no chest pain Gastrointestinal:  no abdominal pain GU:  negative for dysuria Musculoskeletal/Extremities:  no new pain Skin/Integumentary ROS:  no abnormal skin lesions reported Neurologic:  no HA   Objective:   Vitals:   02/08/21 1601  BP: 114/70  Pulse: 64  Resp: 18  Temp: 97.6 F (36.4 C)  TempSrc: Oral  SpO2: 98%  Weight: 192 lb 6.4 oz (87.3 kg)  Height: 5' 1.5" (1.562 m)   Body mass index is 35.76 kg/m.  General:  well developed, well nourished, in no apparent distress Skin:  warm, no pallor or diaphoresis Head:  normocephalic, atraumatic Eyes:  pupils equal and round, sclera anicteric without injection Ears:  canals without lesions, TMs shiny without retraction, no obvious effusion, no erythema Throat/Pharynx:  lips and gingiva without lesion; tongue and uvula midline; non-inflamed pharynx; no exudates or postnasal drainage, no missing incisors or canines Neck: neck supple without adenopathy, thyromegaly, or masses, no bruits, no jugular venous distention Lungs:  clear to auscultation, breath sounds equal bilaterally, no respiratory distress Cardio:  regular rate and rhythm without murmurs Abdomen:  abdomen soft, nontender; bowel sounds normal; no masses, hepatomegaly or splenomegaly Musculoskeletal:  symmetrical muscle groups noted without atrophy or deformity Extremities:  no clubbing, cyanosis, or edema, no deformities, no skin discoloration Neuro:  gait antalgic; deep tendon reflexes normal and symmetric and alert and oriented to person, place, and time Psych: Age appropriate judgment and insight; normal mood   Assessment:   Pre-op exam - Plan: Hemoglobin A1c, Comprehensive metabolic panel, CBC   Plan:   Ck  labs. Will fill out form and fax Mon upon completion of labs. Low risk.  The above laboratory work was ordered and will be sent with this physical. Pending the above workup, the patient is deemed low cardiac risk for the proposed procedure.  The patient voiced understanding and agreement to the  plan.  [name], DO 02/08/21  4:19 PM

## 2021-02-09 ENCOUNTER — Encounter: Payer: Self-pay | Admitting: Cardiology

## 2021-02-09 LAB — HEMOGLOBIN A1C
Hgb A1c MFr Bld: 5 % of total Hgb (ref <5.7)
Mean Plasma Glucose: 97 mg/dL
eAG (mmol/L): 5.4 mmol/L

## 2021-02-09 LAB — CBC
HCT: 41 % (ref 35.0–45.0)
Hemoglobin: 13.6 g/dL (ref 11.7–15.5)
MCH: 30.6 pg (ref 27.0–33.0)
MCHC: 33.2 g/dL (ref 32.0–36.0)
MCV: 92.3 fL (ref 80.0–100.0)
MPV: 11 fL (ref 7.5–12.5)
Platelets: 182 10*3/uL (ref 140–400)
RBC: 4.44 10*6/uL (ref 3.80–5.10)
RDW: 12.3 % (ref 11.0–15.0)
WBC: 7.8 10*3/uL (ref 3.8–10.8)

## 2021-02-09 LAB — COMPREHENSIVE METABOLIC PANEL
AG Ratio: 1.7 (calc) (ref 1.0–2.5)
ALT: 14 U/L (ref 6–29)
AST: 19 U/L (ref 10–35)
Albumin: 4.2 g/dL (ref 3.6–5.1)
Alkaline phosphatase (APISO): 70 U/L (ref 37–153)
BUN: 23 mg/dL (ref 7–25)
CO2: 25 mmol/L (ref 20–32)
Calcium: 9.4 mg/dL (ref 8.6–10.4)
Chloride: 105 mmol/L (ref 98–110)
Creat: 0.93 mg/dL (ref 0.50–1.03)
Globulin: 2.5 g/dL (calc) (ref 1.9–3.7)
Glucose, Bld: 83 mg/dL (ref 65–99)
Potassium: 4.3 mmol/L (ref 3.5–5.3)
Sodium: 140 mmol/L (ref 135–146)
Total Bilirubin: 0.4 mg/dL (ref 0.2–1.2)
Total Protein: 6.7 g/dL (ref 6.1–8.1)

## 2021-02-13 NOTE — Telephone Encounter (Signed)
[facility] is calling back stating they received the form but they are missing the last OV and any recent labs she's had. Please fax to [place] [phone].

## 2021-02-13 NOTE — Telephone Encounter (Signed)
Faxed all requested information to [facility].

## 2021-02-14 NOTE — Telephone Encounter (Signed)
Patient was cleared by PCP

## 2021-02-26 ENCOUNTER — Other Ambulatory Visit (HOSPITAL_BASED_OUTPATIENT_CLINIC_OR_DEPARTMENT_OTHER): Payer: Self-pay

## 2021-02-26 MED FILL — Rosuvastatin Calcium Tab 10 MG: ORAL | 30 days supply | Qty: 30 | Fill #9 | Status: AC

## 2021-02-26 NOTE — Progress Notes (Signed)
Stephanie Castro [place] [address] [place] Phone: [phone] Subjective:   IVilma Meckel, am serving as a scribe for Dr. Hulan Saas. This visit occurred during the SARS-CoV-2 public health emergency.  Safety protocols were in place, including screening questions prior to the visit, additional usage of staff PPE, and extensive cleaning of exam room while observing appropriate contact time as indicated for disinfecting solutions.   I'm seeing this patient by the request  of:  [name], DO  CC: Left hip pain  [id number]  01/09/2021 Repeat injection given today, tolerated the procedure well, discussed icing regimen and home exercises.  We will send patient for a second opinion due toPatient does have a labral pathology and is seeing .  Update 02/27/2021 Stephanie Castro is a 56 y.o. female coming in with complaint of L hip pain. Referred to [facility]. Surgery planned for March 3rd. Patient states the injection didn't help. Made things worse. Just an update.  Patient states at this point does not think any of her conservative therapy would be more beneficial.     Past Medical History:  Diagnosis Date   Arthritis    both knees , back and elbows   BMI 35.0-35.9,adult 05/15/2013   Breast cancer screening, high risk patient 04/04/2013   Pt states she had mmg 2015 nml results, ordered by Dr  Ruben Gottron, [facility] health care. Results are not in Care everywhere. Mammogram completed 06/12/2015 at [facility] in [place], [place] Category 1    Cardiac murmur 10/19/2019   Chronic pain of right knee 04/13/2020   Class 2 severe obesity due to excess calories with serious comorbidity and body mass index (BMI) of 35.0 to 35.9 in adult [facility]) 09/14/2020   Concern about female breast disease without diagnosis 03/17/2018   DOE (dyspnea on exertion) 08/08/2015   Dysplastic nevus    beneath  r eye   Edema of extremities 07/07/2013   Last Assessment & Plan:  Pt takes HCTZ daily.   Last Assessment & Plan:  Formatting of this note might be different from the original. Pt takes HCTZ daily.   Elevated BP without diagnosis of hypertension 06/15/2015   Elevated uric acid in blood 05/02/2020   Essential hypertension 02/26/2017   Exercise-induced asthma    Fatigue 07/06/2013   Last Assessment & Plan:  Will review previous records from previous PCP, pt had thyroid done recently and was wnl. Pt had questions re lupus/MS and how to get tested for these.   Last Assessment & Plan:  Formatting of this note might be different from the original. Will review previous records from previous PCP, pt had thyroid done recently and was wnl. Pt had questions re lupus/MS and how to get    GERD (gastroesophageal reflux disease)    Greater trochanteric bursitis of left hip 08/30/2020   Injection given August 30, 2020   Hypertension    Lateral epicondylitis of both elbows 08/06/2018   Left knee pain 08/30/2020   Low TSH level 04/22/2013   Mixed dyslipidemia 02/22/2020   Myalgia 07/06/2013   Last Assessment & Plan:  Formatting of this note might be different from the original. Will check labs today including sed rate, ANA/RF.  Last Assessment & Plan:  Formatting of this note might be different from the original. Pt would like to be tested for lupus in the future. Has been tested for RA and was negative  Other and unspecified hyperlipidemia 04/06/2011   Palpitations 10/19/2019   Perimenopausal 05/15/2013   Periodic edema 04/04/2013   Pt reports taking "water pill" for many years due to feeling "swollen". Historically, took HCTZ but became hypokalemic. She was switched to triamterene-HCTZ with no more hypokalemic episodes.     Preventative health care 04/04/2013   Primary localized osteoarthritis of right knee 10/13/2014   Squamous cell carcinoma, face 02/24/2014   Tear of medial meniscus of right knee 10/13/2014   Vitamin B12  deficiency 05/15/2013   Last Assessment & Plan:  Formatting of this note might be different from the original. Recheck level today, may need to restart monthly Vit B12 shots   Vitamin D deficiency 02/24/2014   Weight gain 04/24/2015   Past Surgical History:  Procedure Laterality Date   ABDOMINAL HYSTERECTOMY  2007   KNEE ARTHROSCOPY WITH MEDIAL MENISECTOMY Right 10/13/2014   Procedure: RIGHT KNEE ARTHROSCOPY WITH PARTIAL MEDIAL MENISECTOMY AND CHONDROPLASTY ;  Surgeon: Marchia Bond, MD;  Location: [facility];  Service: Orthopedics;  Laterality: Right;   Social History   Socioeconomic History   Marital status: Married    Spouse name: Not on file   Number of children: Not on file   Years of education: Not on file   Highest education level: Not on file  Occupational History   Not on file  Tobacco Use   Smoking status: Former    Packs/day: 1.00    Years: 20.00    Pack years: 20.00    Types: Cigarettes    Quit date: 2005    Years since quitting: 18.0   Smokeless tobacco: Never  Vaping Use   Vaping Use: Never used  Substance and Sexual Activity   Alcohol use: Yes    Alcohol/week: 2.0 standard drinks    Types: 1 Glasses of wine, 1 Shots of liquor per week   Drug use: Never   Sexual activity: Yes    Birth control/protection: Surgical  Other Topics Concern   Not on file  Social History Narrative   Married to Stephanie Castro.    Social Determinants of Health   Financial Resource Strain: Not on file  Food Insecurity: Not on file  Transportation Needs: Not on file  Physical Activity: Not on file  Stress: Not on file  Social Connections: Not on file   Allergies  Allergen Reactions   Penicillins    Family History  Problem Relation Age of Onset   Colon polyps Mother    Hyperlipidemia Mother    Breast cancer Mother 29       breast   Heart attack Father    COPD Father    Colon polyps Sister    Heart attack Sister    Hyperlipidemia Sister    Hypertension Sister     Heart attack Sister    Hyperlipidemia Maternal Aunt    Breast cancer Maternal Aunt        breast   Hypertension Maternal Uncle    Diabetes Maternal Uncle    Colon cancer Maternal Uncle        colon   Hyperlipidemia Maternal Grandmother    Diabetes Maternal Grandmother    Breast cancer Maternal Grandmother        breast   Diabetes Maternal Grandfather    Sudden death Neg Hx    Esophageal cancer Neg Hx    Rectal cancer Neg Hx    Stomach cancer Neg Hx      Current Outpatient Medications (Cardiovascular):  olmesartan (BENICAR) 20 MG tablet, Take 1/2 tablet (10 mg total) by mouth daily.   rosuvastatin (CRESTOR) 10 MG tablet, TAKE 1 TABLET BY MOUTH ONCE DAILY   Current Outpatient Medications (Analgesics):    meloxicam (MOBIC) 7.5 MG tablet, TAKE 1 TABLET BY MOUTH ONCE DAILY  Current Outpatient Medications (Hematological):    cyanocobalamin (,VITAMIN B-12,) 1000 MCG/ML injection, INJECT 1ML INTO THE SKIN EVERY 2 WEEKS  Current Outpatient Medications (Other):    Diclofenac Sodium (PENNSAID) 2 % SOLN, Place 1 application onto the skin 2 (two) times daily.   ergocalciferol (VITAMIN D2) 1.25 MG (50000 UT) capsule, Take 50,000 Units by mouth once a week.   Vitamin D, Ergocalciferol, (DRISDOL) 1.25 MG (50000 UNIT) CAPS capsule, Take 1 capsule (50,000 Units total) by mouth every 7 (seven) days.    Review of Systems:  No headache, visual changes, nausea, vomiting, diarrhea, constipation, dizziness, abdominal pain, skin rash, fevers, chills, night sweats, weight loss, swollen lymph nodes, joint swelling, chest pain, shortness of breath, mood changes. POSITIVE muscle aches, body aches  Objective  Blood pressure 124/80, pulse 71, height 5\' 1"  (1.549 m), weight 193 lb (87.5 kg), SpO2 98 %.   General: No apparent distress alert and oriented x3 mood and affect normal, dressed appropriately.  HEENT: Pupils equal, extraocular movements intact  Respiratory: Patient's speak in full sentences  and does not appear short of breath  Cardiovascular: No lower extremity edema, non tender, no erythema  Antalgic gait noted.  Patient was sitting in the relatively comfortable in the chair but was still favoring the left hip.   Impression and Recommendations:    The above documentation has been reviewed and is accurate and complete Lyndal Pulley, DO

## 2021-02-27 ENCOUNTER — Other Ambulatory Visit: Payer: Self-pay

## 2021-02-27 ENCOUNTER — Ambulatory Visit: Payer: BC Managed Care – PPO | Admitting: Family Medicine

## 2021-02-27 DIAGNOSIS — M25552 Pain in left hip: Secondary | ICD-10-CM

## 2021-02-27 NOTE — Assessment & Plan Note (Signed)
Patient does have left hip pain.  Is seeing a orthopedic surgeon and discussed surgical intervention at this time.  Discussed with her the importance of possible physical therapy afterwards.  Patient at this point will follow-up with me more on an as-needed basis.

## 2021-03-19 ENCOUNTER — Ambulatory Visit: Payer: Self-pay | Admitting: Orthopedic Surgery

## 2021-03-20 DIAGNOSIS — M1612 Unilateral primary osteoarthritis, left hip: Secondary | ICD-10-CM | POA: Diagnosis not present

## 2021-03-27 ENCOUNTER — Other Ambulatory Visit (HOSPITAL_BASED_OUTPATIENT_CLINIC_OR_DEPARTMENT_OTHER): Payer: Self-pay

## 2021-03-27 MED ORDER — DOCUSATE SODIUM 100 MG PO CAPS
ORAL_CAPSULE | ORAL | 0 refills | Status: DC
  Filled 2021-03-27: qty 100, 50d supply, fill #0

## 2021-03-27 MED ORDER — HYDROCODONE-ACETAMINOPHEN 5-325 MG PO TABS
ORAL_TABLET | ORAL | 0 refills | Status: DC
  Filled 2021-03-27: qty 30, 5d supply, fill #0

## 2021-03-27 MED ORDER — METHOCARBAMOL 500 MG PO TABS
ORAL_TABLET | ORAL | 0 refills | Status: DC
  Filled 2021-03-27: qty 30, 7d supply, fill #0

## 2021-03-27 MED ORDER — ONDANSETRON HCL 4 MG PO TABS
ORAL_TABLET | ORAL | 0 refills | Status: DC
  Filled 2021-03-27: qty 20, 5d supply, fill #0

## 2021-03-27 MED ORDER — SENNOSIDES 8.6 MG PO TABS
ORAL_TABLET | ORAL | 0 refills | Status: DC
  Filled 2021-03-27: qty 100, 50d supply, fill #0

## 2021-03-27 MED ORDER — MELOXICAM 15 MG PO TABS
ORAL_TABLET | ORAL | 3 refills | Status: DC
  Filled 2021-03-27: qty 30, 30d supply, fill #0

## 2021-03-27 MED ORDER — ASPIRIN 81 MG PO CHEW
CHEWABLE_TABLET | ORAL | 0 refills | Status: DC
  Filled 2021-03-27: qty 108, 54d supply, fill #0

## 2021-03-28 ENCOUNTER — Other Ambulatory Visit (HOSPITAL_BASED_OUTPATIENT_CLINIC_OR_DEPARTMENT_OTHER): Payer: Self-pay

## 2021-03-29 ENCOUNTER — Other Ambulatory Visit (HOSPITAL_BASED_OUTPATIENT_CLINIC_OR_DEPARTMENT_OTHER): Payer: Self-pay

## 2021-03-29 DIAGNOSIS — M1612 Unilateral primary osteoarthritis, left hip: Secondary | ICD-10-CM | POA: Diagnosis not present

## 2021-03-29 HISTORY — PX: TOTAL HIP ARTHROPLASTY: SHX124

## 2021-03-29 MED ORDER — OXYCODONE HCL 5 MG PO TABS
ORAL_TABLET | ORAL | 0 refills | Status: DC
  Filled 2021-03-29: qty 40, 6d supply, fill #0

## 2021-04-15 ENCOUNTER — Ambulatory Visit: Payer: 59 | Admitting: Family Medicine

## 2021-04-22 ENCOUNTER — Other Ambulatory Visit (HOSPITAL_BASED_OUTPATIENT_CLINIC_OR_DEPARTMENT_OTHER): Payer: Self-pay

## 2021-04-22 ENCOUNTER — Other Ambulatory Visit: Payer: Self-pay | Admitting: Cardiology

## 2021-04-22 MED ORDER — ROSUVASTATIN CALCIUM 10 MG PO TABS
10.0000 mg | ORAL_TABLET | Freq: Every day | ORAL | 1 refills | Status: DC
  Filled 2021-04-22: qty 90, 90d supply, fill #0
  Filled 2021-08-19: qty 90, 90d supply, fill #1

## 2021-04-24 ENCOUNTER — Other Ambulatory Visit (HOSPITAL_BASED_OUTPATIENT_CLINIC_OR_DEPARTMENT_OTHER): Payer: Self-pay

## 2021-04-24 ENCOUNTER — Encounter: Payer: Self-pay | Admitting: Family Medicine

## 2021-04-24 DIAGNOSIS — E538 Deficiency of other specified B group vitamins: Secondary | ICD-10-CM

## 2021-04-24 MED ORDER — CYANOCOBALAMIN 1000 MCG/ML IJ SOLN
INTRAMUSCULAR | 0 refills | Status: DC
  Filled 2021-04-24: qty 6, 84d supply, fill #0
  Filled 2021-08-09: qty 4, 42d supply, fill #1

## 2021-04-29 ENCOUNTER — Other Ambulatory Visit (HOSPITAL_BASED_OUTPATIENT_CLINIC_OR_DEPARTMENT_OTHER): Payer: Self-pay

## 2021-04-29 MED ORDER — METHOCARBAMOL 500 MG PO TABS
ORAL_TABLET | ORAL | 0 refills | Status: DC
  Filled 2021-04-29: qty 20, 5d supply, fill #0

## 2021-05-01 ENCOUNTER — Other Ambulatory Visit: Payer: Self-pay | Admitting: Family Medicine

## 2021-05-01 ENCOUNTER — Other Ambulatory Visit (INDEPENDENT_AMBULATORY_CARE_PROVIDER_SITE_OTHER): Payer: BC Managed Care – PPO

## 2021-05-01 ENCOUNTER — Encounter: Payer: Self-pay | Admitting: Family Medicine

## 2021-05-01 ENCOUNTER — Ambulatory Visit: Payer: BC Managed Care – PPO | Admitting: Family Medicine

## 2021-05-01 ENCOUNTER — Other Ambulatory Visit (HOSPITAL_BASED_OUTPATIENT_CLINIC_OR_DEPARTMENT_OTHER): Payer: Self-pay

## 2021-05-01 VITALS — BP 132/80 | HR 62 | Temp 98.0°F | Ht 61.5 in | Wt 194.4 lb

## 2021-05-01 DIAGNOSIS — E559 Vitamin D deficiency, unspecified: Secondary | ICD-10-CM | POA: Diagnosis not present

## 2021-05-01 DIAGNOSIS — E538 Deficiency of other specified B group vitamins: Secondary | ICD-10-CM | POA: Diagnosis not present

## 2021-05-01 DIAGNOSIS — R6889 Other general symptoms and signs: Secondary | ICD-10-CM

## 2021-05-01 DIAGNOSIS — D72818 Other decreased white blood cell count: Secondary | ICD-10-CM | POA: Diagnosis not present

## 2021-05-01 DIAGNOSIS — R5383 Other fatigue: Secondary | ICD-10-CM | POA: Diagnosis not present

## 2021-05-01 LAB — COMPREHENSIVE METABOLIC PANEL
ALT: 11 U/L (ref 0–35)
AST: 18 U/L (ref 0–37)
Albumin: 4.3 g/dL (ref 3.5–5.2)
Alkaline Phosphatase: 79 U/L (ref 39–117)
BUN: 13 mg/dL (ref 6–23)
CO2: 27 mEq/L (ref 19–32)
Calcium: 9.4 mg/dL (ref 8.4–10.5)
Chloride: 106 mEq/L (ref 96–112)
Creatinine, Ser: 0.73 mg/dL (ref 0.40–1.20)
GFR: 91.67 mL/min (ref >60.00)
Glucose, Bld: 84 mg/dL (ref 70–99)
Potassium: 3.9 mEq/L (ref 3.5–5.1)
Sodium: 140 mEq/L (ref 135–145)
Total Bilirubin: 0.5 mg/dL (ref 0.2–1.2)
Total Protein: 6.4 g/dL (ref 6.0–8.3)

## 2021-05-01 LAB — CBC
HCT: 34.5 % — ABNORMAL LOW (ref 36.0–46.0)
Hemoglobin: 11.5 g/dL — ABNORMAL LOW (ref 12.0–15.0)
MCHC: 33.3 g/dL (ref 30.0–36.0)
MCV: 88.8 fl (ref 78.0–100.0)
Platelets: 193 10*3/uL (ref 150.0–400.0)
RBC: 3.89 Mil/uL (ref 3.87–5.11)
RDW: 14 % (ref 11.5–15.5)
WBC: 5.1 10*3/uL (ref 4.0–10.5)

## 2021-05-01 LAB — IBC + FERRITIN
Ferritin: 52.5 ng/mL (ref 10.0–291.0)
Iron: 62 ug/dL (ref 42–145)
Saturation Ratios: 17.1 % — ABNORMAL LOW (ref 20.0–50.0)
TIBC: 362.6 ug/dL (ref 250.0–450.0)
Transferrin: 259 mg/dL (ref 212.0–360.0)

## 2021-05-01 LAB — TSH: TSH: 3.05 u[IU]/mL (ref 0.35–5.50)

## 2021-05-01 LAB — VITAMIN B12: Vitamin B-12: 528 pg/mL (ref 211–911)

## 2021-05-01 LAB — VITAMIN D 25 HYDROXY (VIT D DEFICIENCY, FRACTURES): VITD: 24.1 ng/mL — ABNORMAL LOW (ref 30.00–100.00)

## 2021-05-01 MED ORDER — VITAMIN D (ERGOCALCIFEROL) 1.25 MG (50000 UNIT) PO CAPS
50000.0000 [IU] | ORAL_CAPSULE | ORAL | 0 refills | Status: DC
  Filled 2021-05-01: qty 12, 84d supply, fill #0

## 2021-05-01 NOTE — Patient Instructions (Addendum)
Go back on your compression stockings. ? ?Give [place] 2-3 business days to get the results of your labs back.  ? ?If labs are normal, I am going to refer you to a sleep specialist.  ? ?The Shingrix vaccine (for shingles) is a 2 shot series spaced 2-6 months apart. It can make people feel low energy, achy and almost like they have the flu for 48 hours after injection. 1/5 people can have nausea and/or vomiting. Please plan accordingly when deciding on when to get this shot. Call our office for a nurse visit appointment to get this. The second shot of the series is less severe regarding the side effects, but it still lasts 48 hours.  ? ?Let us know if you need anything. ?

## 2021-05-01 NOTE — Progress Notes (Signed)
Chief Complaint  ?Patient presents with  ? Follow-up  ?  6 month ?Fatigue and feeling cold all the time since surgery  ? ? ?Subjective: ?Patient is a 56 y.o. female here for fatigue. ? ?Over past mo, has had more fatigue and cold intolerance. Things have gotten worse. She has a hx of low B12 and VitD. Mood is stable.  She does snore at night, no witnessed apneic episodes.  She does not wake up gasping for air.  Sometimes wakes up feeling fatigued.  Diet is fair.  She has not been exercising due to recovering from right hip surgery.  Symptoms started after she had surgery.  No history of thyroid disease. ? ?Past Medical History:  ?Diagnosis Date  ? Arthritis   ? both knees , back and elbows  ? BMI 35.0-35.9,adult 05/15/2013  ? Breast cancer screening, high risk patient 04/04/2013  ? Pt states she had mmg 2015 nml results, ordered by Dr  Ruben Gottron, [facility] health care. Results are not in Care everywhere. Mammogram completed 06/12/2015 at [facility] in [place], BI-RADS Category 1   ? Cardiac murmur 10/19/2019  ? Chronic pain of right knee 04/13/2020  ? Class 2 severe obesity due to excess calories with serious comorbidity and body mass index (BMI) of 35.0 to 35.9 in adult [facility]) 09/14/2020  ? Concern about female breast disease without diagnosis 03/17/2018  ? DOE (dyspnea on exertion) 08/08/2015  ? Dysplastic nevus   ? beneath r eye  ? Edema of extremities 07/07/2013  ? Last Assessment & Plan:  Pt takes HCTZ daily.   Last Assessment & Plan:  Formatting of this note might be different from the original. Pt takes HCTZ daily.  ? Elevated BP without diagnosis of hypertension 06/15/2015  ? Elevated uric acid in blood 05/02/2020  ? Essential hypertension 02/26/2017  ? Exercise-induced asthma   ? Fatigue 07/06/2013  ? Last Assessment & Plan:  Will review previous records from previous PCP, pt had thyroid done recently and was wnl. Pt had questions re lupus/MS and how  to get tested for these.   Last Assessment & Plan:  Formatting of this note might be different from the original. Will review previous records from previous PCP, pt had thyroid done recently and was wnl. Pt had questions re lupus/MS and how to get   ? GERD (gastroesophageal reflux disease)   ? Greater trochanteric bursitis of left hip 08/30/2020  ? Injection given August 30, 2020  ? Hypertension   ? Lateral epicondylitis of both elbows 08/06/2018  ? Left knee pain 08/30/2020  ? Low TSH level 04/22/2013  ? Mixed dyslipidemia 02/22/2020  ? Myalgia 07/06/2013  ? Last Assessment & Plan:  Formatting of this note might be different from the original. Will check labs today including sed rate, ANA/RF.  Last Assessment & Plan:  Formatting of this note might be different from the original. Pt would like to be tested for lupus in the future. Has been tested for RA and was negative  ? Other and unspecified hyperlipidemia 04/06/2011  ? Palpitations 10/19/2019  ? Perimenopausal 05/15/2013  ? Periodic edema 04/04/2013  ? Pt reports taking "water pill" for many years due to feeling "swollen". Historically, took HCTZ but became hypokalemic. She was switched to triamterene-HCTZ with no more hypokalemic episodes.    ? Preventative health care 04/04/2013  ? Primary localized osteoarthritis of right knee 10/13/2014  ? Squamous cell carcinoma, face 02/24/2014  ?  Tear of medial meniscus of right knee 10/13/2014  ? Vitamin B12 deficiency 05/15/2013  ? Last Assessment & Plan:  Formatting of this note might be different from the original. Recheck level today, may need to restart monthly Vit B12 shots  ? Vitamin D deficiency 02/24/2014  ? Weight gain 04/24/2015  ? ? ?Objective: ?BP 132/80   Pulse 62   Temp 98 ?F (36.7 ?C) (Oral)   Ht 5' 1.5" (1.562 m)   Wt 194 lb 6 oz (88.2 kg)   SpO2 99%   BMI 36.13 kg/m?  ?General: Awake, appears stated age ?Heart: RRR, 2+ pitting edema around the distal third of tibia/ankle area on the right, 1+ pitting edema around the  ankles on the left ?MSK: No tenderness over the calf bilaterally ?Lungs: CTAB, no rales, wheezes or rhonchi. No accessory muscle use ?Neuro: Antalgic gait ?Psych: Age appropriate judgment and insight, normal affect and mood ? ?Assessment and Plan: ?Cold intolerance - Plan: CBC, Comprehensive metabolic panel, TSH ? ?Other fatigue - Plan: Comprehensive metabolic panel, TSH ? ?Vitamin D deficiency - Plan: VITAMIN D 25 Hydroxy (Vit-D Deficiency, Fractures) ? ?Vitamin B12 deficiency (non anemic) - Plan: B12 ? ?New problem, uncertain prognosis.  Check above labs.  If normal, will refer to pulmonology for sleep evaluation.  We will follow-up on levels for chronic deficiencies.  Counseled on diet and exercise.  Recommended using compression stockings. ?The patient voiced understanding and agreement to the plan. ? ?[name], DO ?05/01/21  ?8:45 AM ? ? ? ? ? ?

## 2021-05-01 NOTE — Addendum Note (Signed)
Addended by: Kem Boroughs D on: 05/01/2021 04:24 PM ? ? Modules accepted: Orders ? ?

## 2021-05-02 ENCOUNTER — Encounter: Payer: Self-pay | Admitting: Family Medicine

## 2021-05-06 ENCOUNTER — Other Ambulatory Visit: Payer: Self-pay | Admitting: Family Medicine

## 2021-05-06 DIAGNOSIS — R0683 Snoring: Secondary | ICD-10-CM

## 2021-05-06 DIAGNOSIS — D72818 Other decreased white blood cell count: Secondary | ICD-10-CM

## 2021-05-13 DIAGNOSIS — Z1231 Encounter for screening mammogram for malignant neoplasm of breast: Secondary | ICD-10-CM | POA: Diagnosis not present

## 2021-05-17 NOTE — Addendum Note (Signed)
Addended by: Kelle Darting A on: 05/17/2021 09:07 AM ? ? Modules accepted: Orders ? ?

## 2021-05-20 ENCOUNTER — Other Ambulatory Visit (INDEPENDENT_AMBULATORY_CARE_PROVIDER_SITE_OTHER): Payer: BC Managed Care – PPO

## 2021-05-20 DIAGNOSIS — D72818 Other decreased white blood cell count: Secondary | ICD-10-CM

## 2021-05-20 LAB — CBC WITH DIFFERENTIAL/PLATELET
Absolute Monocytes: 360 cells/uL (ref 200–950)
Basophils Absolute: 48 cells/uL (ref 0–200)
Basophils Relative: 1 %
Eosinophils Absolute: 149 cells/uL (ref 15–500)
Eosinophils Relative: 3.1 %
HCT: 35 % (ref 35.0–45.0)
Hemoglobin: 11.3 g/dL — ABNORMAL LOW (ref 11.7–15.5)
Lymphs Abs: 1747 cells/uL (ref 850–3900)
MCH: 29 pg (ref 27.0–33.0)
MCHC: 32.3 g/dL (ref 32.0–36.0)
MCV: 89.7 fL (ref 80.0–100.0)
MPV: 11.1 fL (ref 7.5–12.5)
Monocytes Relative: 7.5 %
Neutro Abs: 2496 cells/uL (ref 1500–7800)
Neutrophils Relative %: 52 %
Platelets: 202 10*3/uL (ref 140–400)
RBC: 3.9 10*6/uL (ref 3.80–5.10)
RDW: 12.8 % (ref 11.0–15.0)
Total Lymphocyte: 36.4 %
WBC: 4.8 10*3/uL (ref 3.8–10.8)

## 2021-05-21 ENCOUNTER — Encounter: Payer: Self-pay | Admitting: Family Medicine

## 2021-05-21 LAB — PATHOLOGIST SMEAR REVIEW

## 2021-05-24 NOTE — Progress Notes (Signed)
05/27/21- 89 yoF former smoker for sleep evaluation courtesy of Dr Nani Ravens with concern of snoring. ?Medical problem list includes HTN, Asthma, GERD, Arthritis, Obesity, Peripheral Edema, Hyperlipidemia,  ?Epworth score-7 ?Body weight today-194 lbs ?She is aware of snoring but indicates her primary sleep complaint is frequent waking after sleep onset with difficulty maintaining sleep through the night.  She rarely naps.  No sleep medicines.  1 cup morning coffee. ?Describes daytime fatigue.  Not told that she stops breathing.  Husband uses CPAP. ?ENT surgery-none.  Denies heart or lung problems.  Denies family history of diagnosed sleep apnea.  Says she breathes comfortably through her nose but admits perennial morning watery eye and nose with some stuffiness. Active GERD. ?Recent left hip replacement-using crutch. ? ?Prior to Admission medications   ?Medication Sig Start Date End Date Taking? Authorizing Provider  ?aspirin 81 MG chewable tablet Chew 1 tablet by mouth twice daily with meals for 45 days. 03/27/21  Yes   ?cyanocobalamin (,VITAMIN B-12,) 1000 MCG/ML injection INJECT 1ML INTO THE SKIN EVERY 2 WEEKS 04/24/21 04/24/22 Yes Wendling, Crosby Oyster, DO  ?rosuvastatin (CRESTOR) 10 MG tablet Take 1 tablet (10 mg total) by mouth daily. 04/22/21  Yes Revankar, Reita Cliche, MD  ?Vitamin D, Ergocalciferol, (DRISDOL) 1.25 MG (50000 UNIT) CAPS capsule Take 1 capsule (50,000 Units total) by mouth every 7 (seven) days. 05/01/21  Yes [name], DO  ?Diclofenac Sodium (PENNSAID) 2 % SOLN Place 1 application onto the skin 2 (two) times daily. ?Patient not taking: Reported on 05/27/2021 08/06/18   Rosemarie Ax, MD  ?docusate sodium (COLACE) 100 MG capsule Take 1 capsule by mouth twice a day by for 30 days. ?Patient not taking: Reported on 05/27/2021 03/27/21     ?HYDROcodone-acetaminophen (NORCO/VICODIN) 5-325 MG tablet Take 1 tablet every 4 hours by mouth as needed for pain for 5 days. ?Patient not taking: Reported on  05/27/2021 03/27/21     ?meloxicam (MOBIC) 15 MG tablet Take 1 tablet by mouth daily with meals. ?Patient not taking: Reported on 05/27/2021 03/27/21     ?methocarbamol (ROBAXIN) 500 MG tablet Take 1 tablet by mouth once every 6 hours as needed for spasms ?Patient not taking: Reported on 05/27/2021 04/29/21     ?ondansetron (ZOFRAN) 4 MG tablet Take 1 tablet by mouth every 6 hours as needed for nausea for 5 days. ?Patient not taking: Reported on 05/27/2021 03/27/21     ?oxyCODONE (OXY IR/ROXICODONE) 5 MG immediate release tablet Take 1 tablet by mouth every 4 hours as needed. ?Patient not taking: Reported on 05/27/2021 03/29/21     ?senna (SENOKOT) 8.6 MG tablet Take 2 tablets by mouth daily at bedtime for 30 days. ?Patient not taking: Reported on 05/27/2021 03/27/21     ? ?Past Medical History:  ?Diagnosis Date  ? Arthritis   ? both knees , back and elbows  ? BMI 35.0-35.9,adult 05/15/2013  ? Breast cancer screening, high risk patient 04/04/2013  ? Pt states she had mmg 2015 nml results, ordered by Dr  Ruben Gottron, [facility] health care. Results are not in Care everywhere. Mammogram completed 06/12/2015 at [facility] in [place], BI-RADS Category 1   ? Cardiac murmur 10/19/2019  ? Chronic pain of right knee 04/13/2020  ? Class 2 severe obesity due to excess calories with serious comorbidity and body mass index (BMI) of 35.0 to 35.9 in adult [facility]) 09/14/2020  ? Concern about female breast disease without diagnosis 03/17/2018  ?  DOE (dyspnea on exertion) 08/08/2015  ? Dysplastic nevus   ? beneath r eye  ? Edema of extremities 07/07/2013  ? Last Assessment & Plan:  Pt takes HCTZ daily.   Last Assessment & Plan:  Formatting of this note might be different from the original. Pt takes HCTZ daily.  ? Elevated BP without diagnosis of hypertension 06/15/2015  ? Elevated uric acid in blood 05/02/2020  ? Essential hypertension 02/26/2017  ? Exercise-induced asthma   ? Fatigue 07/06/2013  ? Last  Assessment & Plan:  Will review previous records from previous PCP, pt had thyroid done recently and was wnl. Pt had questions re lupus/MS and how to get tested for these.   Last Assessment & Plan:  Formatting of this note might be different from the original. Will review previous records from previous PCP, pt had thyroid done recently and was wnl. Pt had questions re lupus/MS and how to get   ? GERD (gastroesophageal reflux disease)   ? Greater trochanteric bursitis of left hip 08/30/2020  ? Injection given August 30, 2020  ? Hypertension   ? Lateral epicondylitis of both elbows 08/06/2018  ? Left knee pain 08/30/2020  ? Low TSH level 04/22/2013  ? Mixed dyslipidemia 02/22/2020  ? Myalgia 07/06/2013  ? Last Assessment & Plan:  Formatting of this note might be different from the original. Will check labs today including sed rate, ANA/RF.  Last Assessment & Plan:  Formatting of this note might be different from the original. Pt would like to be tested for lupus in the future. Has been tested for RA and was negative  ? Other and unspecified hyperlipidemia 04/06/2011  ? Palpitations 10/19/2019  ? Perimenopausal 05/15/2013  ? Periodic edema 04/04/2013  ? Pt reports taking "water pill" for many years due to feeling "swollen". Historically, took HCTZ but became hypokalemic. She was switched to triamterene-HCTZ with no more hypokalemic episodes.    ? Preventative health care 04/04/2013  ? Primary localized osteoarthritis of right knee 10/13/2014  ? Squamous cell carcinoma, face 02/24/2014  ? Tear of medial meniscus of right knee 10/13/2014  ? Vitamin B12 deficiency 05/15/2013  ? Last Assessment & Plan:  Formatting of this note might be different from the original. Recheck level today, may need to restart monthly Vit B12 shots  ? Vitamin D deficiency 02/24/2014  ? Weight gain 04/24/2015  ? ?Past Surgical History:  ?Procedure Laterality Date  ? ABDOMINAL HYSTERECTOMY  2007  ? KNEE ARTHROSCOPY WITH MEDIAL MENISECTOMY Right 10/13/2014  ? Procedure:  RIGHT KNEE ARTHROSCOPY WITH PARTIAL MEDIAL MENISECTOMY AND CHONDROPLASTY ;  Surgeon: Marchia Bond, MD;  Location: [place];  Service: Orthopedics;  Laterality: Right;  ? ?Family History  ?Problem Relation Age of Onset  ? Colon polyps Mother   ? Hyperlipidemia Mother   ? Breast cancer Mother 63  ?     breast  ? Heart attack Father   ? COPD Father   ? Colon polyps Sister   ? Heart attack Sister   ? Hyperlipidemia Sister   ? Hypertension Sister   ? Heart attack Sister   ? Hyperlipidemia Maternal Aunt   ? Breast cancer Maternal Aunt   ?     breast  ? Hypertension Maternal Uncle   ? Diabetes Maternal Uncle   ? Colon cancer Maternal Uncle   ?     colon  ? Hyperlipidemia Maternal Grandmother   ? Diabetes Maternal Grandmother   ? Breast cancer Maternal Grandmother   ?  breast  ? Diabetes Maternal Grandfather   ? Sudden death Neg Hx   ? Esophageal cancer Neg Hx   ? Rectal cancer Neg Hx   ? Stomach cancer Neg Hx   ? ?Social History  ? ?Socioeconomic History  ? Marital status: Married  ?  Spouse name: Not on file  ? Number of children: Not on file  ? Years of education: Not on file  ? Highest education level: Not on file  ?Occupational History  ? Not on file  ?Tobacco Use  ? Smoking status: Former  ?  Packs/day: 1.00  ?  Years: 20.00  ?  Pack years: 20.00  ?  Types: Cigarettes  ?  Quit date: 2005  ?  Years since quitting: 18.3  ? Smokeless tobacco: Never  ?Vaping Use  ? Vaping Use: Never used  ?Substance and Sexual Activity  ? Alcohol use: Yes  ?  Alcohol/week: 2.0 standard drinks  ?  Types: 1 Glasses of wine, 1 Shots of liquor per week  ? Drug use: Never  ? Sexual activity: Yes  ?  Birth control/protection: Surgical  ?Other Topics Concern  ? Not on file  ?Social History Narrative  ? Married to [place].   ? ?Social Determinants of Health  ? ?Financial Resource Strain: Not on file  ?Food Insecurity: Not on file  ?Transportation Needs: Not on file  ?Physical Activity: Not on file  ?Stress: Not on file  ?Social  Connections: Not on file  ?Intimate Partner Violence: Not on file  ? ?ROS-see HPI   + = positive ?Constitutional:    weight loss, night sweats, fevers, chills, +fatigue, lassitude. ?HEENT:    headaches

## 2021-05-27 ENCOUNTER — Encounter: Payer: Self-pay | Admitting: Internal Medicine

## 2021-05-27 ENCOUNTER — Ambulatory Visit (INDEPENDENT_AMBULATORY_CARE_PROVIDER_SITE_OTHER): Payer: BC Managed Care – PPO | Admitting: Internal Medicine

## 2021-05-27 VITALS — BP 132/78 | Ht 62.0 in | Wt 194.0 lb

## 2021-05-27 DIAGNOSIS — F5101 Primary insomnia: Secondary | ICD-10-CM

## 2021-05-27 DIAGNOSIS — G4733 Obstructive sleep apnea (adult) (pediatric): Secondary | ICD-10-CM | POA: Diagnosis not present

## 2021-05-27 DIAGNOSIS — G4700 Insomnia, unspecified: Secondary | ICD-10-CM

## 2021-05-27 DIAGNOSIS — R0683 Snoring: Secondary | ICD-10-CM

## 2021-05-27 HISTORY — DX: Snoring: R06.83

## 2021-05-27 HISTORY — DX: Insomnia, unspecified: G47.00

## 2021-05-27 NOTE — Assessment & Plan Note (Signed)
Not clear how much sleep apnea she may have.  Appropriate discussion done and questions answered. ?Plan-schedule sleep study ?

## 2021-05-27 NOTE — Patient Instructions (Signed)
Order- schedule home sleep test     dx snoring ? ?Please call us about 2 weeks afteer your sleep study for results and recommendations. ?

## 2021-05-27 NOTE — Assessment & Plan Note (Signed)
Sleep is fragmented.  Short sleep latency and no naps.  We are assessing for sleep apnea but she may need separate help managing insomnia.  Decisions will be made after sleep study is completed. ?

## 2021-06-03 ENCOUNTER — Encounter: Payer: Self-pay | Admitting: Family Medicine

## 2021-06-04 ENCOUNTER — Ambulatory Visit (INDEPENDENT_AMBULATORY_CARE_PROVIDER_SITE_OTHER): Payer: BC Managed Care – PPO | Admitting: Family Medicine

## 2021-06-04 ENCOUNTER — Ambulatory Visit: Payer: Self-pay

## 2021-06-04 ENCOUNTER — Encounter: Payer: Self-pay | Admitting: Family Medicine

## 2021-06-04 VITALS — BP 122/74 | HR 56 | Ht 62.0 in | Wt 194.0 lb

## 2021-06-04 DIAGNOSIS — M1711 Unilateral primary osteoarthritis, right knee: Secondary | ICD-10-CM

## 2021-06-04 DIAGNOSIS — M25561 Pain in right knee: Secondary | ICD-10-CM

## 2021-06-04 DIAGNOSIS — G8929 Other chronic pain: Secondary | ICD-10-CM | POA: Diagnosis not present

## 2021-06-04 NOTE — Patient Instructions (Signed)
Good to see you! ?Read about PRP ?See you again in 4-5 weeks ?

## 2021-06-04 NOTE — Assessment & Plan Note (Signed)
Patient given injection today.  Tolerated the procedure well, discussed icing regimen and home exercises, could be candidate for viscosupplementation which patient did respond to.  Likely this is secondary to exacerbation from patient's most recent hip surgery.  Follow-up with me again in 6 weeks otherwise. ?

## 2021-06-04 NOTE — Progress Notes (Signed)
[facility] [address] [place] [address] Phone: [phone] Subjective:   Stephanie Castro, am serving as a scribe for Dr. Antoine Castro.  This visit occurred during the SARS-CoV-2 public health emergency.  Safety protocols were in place, including screening questions prior to the visit, additional usage of staff PPE, and extensive cleaning of exam room while observing appropriate contact time as indicated for disinfecting solutions.  I'm seeing this patient by the request  of:  Stephanie Dory, DO  CC: Right knee pain  [id number]  Stephanie Castro is a 56 y.o. female coming in with complaint of R knee pain. Patient states that she had a L hip replacement on march 3rd. Pain in knee has increased due to compensating. Pain over patellar tendon. Pain is present constantly. Weightbearing increases her pain.       Past Medical History:  Diagnosis Date   Arthritis    both knees , back and elbows   BMI 35.0-35.9,adult 05/15/2013   Breast cancer screening, high risk patient 04/04/2013   Pt states she had mmg 2015 nml results, ordered by Dr  Stephanie Castro, [facility] health care. Results are not in Care everywhere. [facility] in [facility] [place], [place] Category 1    Cardiac murmur 10/19/2019   Chronic pain of right knee 04/13/2020   Class 2 severe obesity due to excess calories with serious comorbidity and body mass index (BMI) of 35.0 to 35.9 in adult [facility]) 09/14/2020   Concern about female breast disease without diagnosis 03/17/2018   DOE (dyspnea on exertion) 08/08/2015   Dysplastic nevus    beneath r eye   Edema of extremities 07/07/2013   Last Assessment & Plan:  Pt takes HCTZ daily.   Last Assessment & Plan:  Formatting of this note might be different from the original. Pt takes HCTZ daily.   Elevated BP without diagnosis of hypertension 06/15/2015    Elevated uric acid in blood 05/02/2020   Essential hypertension 02/26/2017   Exercise-induced asthma    Fatigue 07/06/2013   Last Assessment & Plan:  Will review previous records from previous PCP, pt had thyroid done recently and was wnl. Pt had questions re lupus/MS and how to get tested for these.   Last Assessment & Plan:  Formatting of this note might be different from the original. Will review previous records from previous PCP, pt had thyroid done recently and was wnl. Pt had questions re lupus/MS and how to get    GERD (gastroesophageal reflux disease)    Greater trochanteric bursitis of left hip 08/30/2020   Injection given August 30, 2020   Hypertension    Lateral epicondylitis of both elbows 08/06/2018   Left knee pain 08/30/2020   Low TSH level 04/22/2013   Mixed dyslipidemia 02/22/2020   Myalgia 07/06/2013   Last Assessment & Plan:  Formatting of this note might be different from the original. Will check labs today including sed rate, ANA/RF.  Last Assessment & Plan:  Formatting of this note might be different from the original. Pt would like to be tested for lupus in the future. Has been tested for RA and was negative   Other and unspecified hyperlipidemia 04/06/2011   Palpitations 10/19/2019   Perimenopausal 05/15/2013   Periodic edema 04/04/2013   Pt reports taking "water pill" for many years due to feeling "swollen". Historically, took HCTZ but became hypokalemic. She was switched  to triamterene-HCTZ with no more hypokalemic episodes.     Preventative health care 04/04/2013   Primary localized osteoarthritis of right knee 10/13/2014   Squamous cell carcinoma, face 02/24/2014   Tear of medial meniscus of right knee 10/13/2014   Vitamin B12 deficiency 05/15/2013   Last Assessment & Plan:  Formatting of this note might be different from the original. Recheck level today, may need to restart monthly Vit B12 shots   Vitamin D deficiency 02/24/2014   Weight gain 04/24/2015   Past Surgical History:   Procedure Laterality Date   ABDOMINAL HYSTERECTOMY  2007   KNEE ARTHROSCOPY WITH MEDIAL MENISECTOMY Right 10/13/2014   Procedure: RIGHT KNEE ARTHROSCOPY WITH PARTIAL MEDIAL MENISECTOMY AND CHONDROPLASTY ;  Surgeon: Stephanie Lucy, MD;  Location: [facility];  Service: Orthopedics;  Laterality: Right;   Social History   Socioeconomic History   Marital status: Married    Spouse name: Not on file   Number of children: Not on file   Years of education: Not on file   Highest education level: Not on file  Occupational History   Not on file  Tobacco Use   Smoking status: Former    Packs/day: 1.00    Years: 20.00    Pack years: 20.00    Types: Cigarettes    Quit date: 2005    Years since quitting: 18.3   Smokeless tobacco: Never  Vaping Use   Vaping Use: Never used  Substance and Sexual Activity   Alcohol use: Yes    Alcohol/week: 2.0 standard drinks    Types: 1 Glasses of wine, 1 Shots of liquor per week   Drug use: Never   Sexual activity: Yes    Birth control/protection: Surgical  Other Topics Concern   Not on file  Social History Narrative   Married to Stephanie Castro.    Social Determinants of Health   Financial Resource Strain: Not on file  Food Insecurity: Not on file  Transportation Needs: Not on file  Physical Activity: Not on file  Stress: Not on file  Social Connections: Not on file   Allergies  Allergen Reactions   Penicillins    Family History  Problem Relation Age of Onset   Colon polyps Mother    Hyperlipidemia Mother    Breast cancer Mother 8       breast   Heart attack Father    COPD Father    Colon polyps Sister    Heart attack Sister    Hyperlipidemia Sister    Hypertension Sister    Heart attack Sister    Hyperlipidemia Maternal Aunt    Breast cancer Maternal Aunt        breast   Hypertension Maternal Uncle    Diabetes Maternal Uncle    Colon cancer Maternal Uncle        colon   Hyperlipidemia Maternal Grandmother    Diabetes  Maternal Grandmother    Breast cancer Maternal Grandmother        breast   Diabetes Maternal Grandfather    Sudden death Neg Hx    Esophageal cancer Neg Hx    Rectal cancer Neg Hx    Stomach cancer Neg Hx      Current Outpatient Medications (Cardiovascular):    rosuvastatin (CRESTOR) 10 MG tablet, Take 1 tablet (10 mg total) by mouth daily.   Current Outpatient Medications (Analgesics):    aspirin 81 MG chewable tablet, Chew 1 tablet by mouth twice daily with meals for 45 days.  meloxicam (MOBIC) 15 MG tablet, Take 1 tablet by mouth daily with meals.   HYDROcodone-acetaminophen (NORCO/VICODIN) 5-325 MG tablet, Take 1 tablet every 4 hours by mouth as needed for pain for 5 days. (Patient not taking: Reported on 05/27/2021)   oxyCODONE (OXY IR/ROXICODONE) 5 MG immediate release tablet, Take 1 tablet by mouth every 4 hours as needed. (Patient not taking: Reported on 05/27/2021)  Current Outpatient Medications (Hematological):    cyanocobalamin (,VITAMIN B-12,) 1000 MCG/ML injection, INJECT INTO THE SKIN EVERY 2 WEEKS  Current Outpatient Medications (Other):    Diclofenac Sodium (PENNSAID) 2 % SOLN, Place 1 application onto the skin 2 (two) times daily.   Vitamin D, Ergocalciferol, (DRISDOL) 1.25 MG (50000 UNIT) CAPS capsule, Take 1 capsule (50,000 Units total) by mouth every 7 (seven) days.   docusate sodium (COLACE) 100 MG capsule, Take 1 capsule by mouth twice a day by for 30 days. (Patient not taking: Reported on 05/27/2021)   methocarbamol (ROBAXIN) 500 MG tablet, Take 1 tablet by mouth once every 6 hours as needed for spasms (Patient not taking: Reported on 05/27/2021)   ondansetron (ZOFRAN) 4 MG tablet, Take 1 tablet by mouth every 6 hours as needed for nausea for 5 days. (Patient not taking: Reported on 05/27/2021)   senna (SENOKOT) 8.6 MG tablet, Take 2 tablets by mouth daily at bedtime for 30 days. (Patient not taking: Reported on 05/27/2021)   Reviewed prior external information  including notes and imaging from  primary care provider As well as notes that were available from care everywhere and other healthcare systems.  Past medical history, social, surgical and family history all reviewed in electronic medical record.  No pertanent information unless stated regarding to the chief complaint.   Review of Systems:  No headache, visual changes, nausea, vomiting, diarrhea, constipation, dizziness, abdominal pain, skin rash, fevers, chills, night sweats, weight loss, swollen lymph nodes, body aches, joint swelling, chest pain, shortness of breath, mood changes. POSITIVE muscle aches  Objective  Blood pressure 122/74, pulse (!) 56, height 5\' 2"  (1.575 m), weight 194 lb (88 kg), SpO2 97 %.   General: No apparent distress alert and oriented x3 mood and affect normal, dressed appropriately.  HEENT: Pupils equal, extraocular movements intact  Respiratory: Patient's speak in full sentences and does not appear short of breath  Gait antalgic  MSK: Patient's left hip is postsurgical.  Patient right knee does have a effusion noted.  Patient does have tenderness to palpation of the patellofemoral joint.  No pain in the calf region itself.  Trace edema of both lower extremities  Limited muscular skeletal ultrasound was performed and interpreted by Stephanie Castro, M  Limited ultrasound of patient's right knee does show that there is hypoechoic changes in the patellofemoral joint, moderate narrowing of the patellofemoral joint.  Meniscus appear to be fairly unremarkable. Impression: Right knee arthritis with effusion  After informed written and verbal consent, patient was seated on exam table. Right knee was prepped with alcohol swab and utilizing anterolateral approach, patient's right knee space was injected with 4:1  marcaine 0.5%: Kenalog 40mg /dL. Patient tolerated the procedure well without immediate complications.   Impression and Recommendations:     The above documentation  has been reviewed and is accurate and complete Judi Saa, DO

## 2021-06-05 ENCOUNTER — Encounter: Payer: Self-pay | Admitting: Family Medicine

## 2021-06-05 ENCOUNTER — Other Ambulatory Visit (HOSPITAL_BASED_OUTPATIENT_CLINIC_OR_DEPARTMENT_OTHER): Payer: Self-pay

## 2021-06-05 ENCOUNTER — Ambulatory Visit: Payer: BC Managed Care – PPO | Admitting: Family Medicine

## 2021-06-05 VITALS — BP 120/76 | HR 84 | Temp 98.1°F | Ht 61.5 in | Wt 193.2 lb

## 2021-06-05 DIAGNOSIS — M7989 Other specified soft tissue disorders: Secondary | ICD-10-CM | POA: Insufficient documentation

## 2021-06-05 HISTORY — DX: Other specified soft tissue disorders: M79.89

## 2021-06-05 MED ORDER — FUROSEMIDE 40 MG PO TABS
40.0000 mg | ORAL_TABLET | Freq: Every day | ORAL | 3 refills | Status: DC
  Filled 2021-06-05: qty 30, 30d supply, fill #0
  Filled 2021-07-02: qty 30, 30d supply, fill #1
  Filled 2021-07-28: qty 30, 30d supply, fill #2
  Filled 2021-09-12: qty 30, 30d supply, fill #3

## 2021-06-05 MED ORDER — POTASSIUM CHLORIDE CRYS ER 10 MEQ PO TBCR
EXTENDED_RELEASE_TABLET | ORAL | 2 refills | Status: DC
  Filled 2021-06-05: qty 60, 30d supply, fill #0
  Filled 2021-07-02: qty 60, 30d supply, fill #1
  Filled 2021-07-28: qty 60, 30d supply, fill #2

## 2021-06-05 NOTE — Patient Instructions (Addendum)
Take around 11-1 PM. It lasts 6 hours.  ? ?For the swelling in your lower extremities, be sure to elevate your legs when able, mind the salt intake, stay physically active and consider wearing compression stockings. ? ?Let us know if you need anything. ?

## 2021-06-05 NOTE — Progress Notes (Signed)
Chief Complaint  ?Patient presents with  ? Edema  ? ? ?Stephanie Castro here for bilateral leg swelling. ? ?Duration: 3 months ?It normally happens in late afternoon/early evening.  Goes up to her knees after starting in her ankles.  Sometimes her hands will swell as well. ?Hx of prolonged bedrest, recent surgery, travel or injury? Yes- surgery but had on R side, no pain or concern for DVT ?Pain the calf? No ?SOB? No ?Personal or family history of clot or bleeding disorder? No ?Hx of heart failure, renal failure, hepatic failure? No ?She has been wearing compression stockings, elevating legs, doing Pilates for exercise, and minding her salt intake. ? ?Past Medical History:  ?Diagnosis Date  ? Arthritis   ? both knees , back and elbows  ? BMI 35.0-35.9,adult 05/15/2013  ? Breast cancer screening, high risk patient 04/04/2013  ? Pt states she had mmg 2015 nml results, ordered by Dr  Ruben Gottron, [facility] health care. Results are not in Care everywhere. Mammogram completed 06/12/2015 at [place] in [place], BI-RADS Category 1   ? Cardiac murmur 10/19/2019  ? Chronic pain of right knee 04/13/2020  ? Class 2 severe obesity due to excess calories with serious comorbidity and body mass index (BMI) of 35.0 to 35.9 in adult [facility]) 09/14/2020  ? Concern about female breast disease without diagnosis 03/17/2018  ? DOE (dyspnea on exertion) 08/08/2015  ? Dysplastic nevus   ? beneath r eye  ? Edema of extremities 07/07/2013  ? Last Assessment & Plan:  Pt takes HCTZ daily.   Last Assessment & Plan:  Formatting of this note might be different from the original. Pt takes HCTZ daily.  ? Elevated BP without diagnosis of hypertension 06/15/2015  ? Elevated uric acid in blood 05/02/2020  ? Essential hypertension 02/26/2017  ? Exercise-induced asthma   ? Fatigue 07/06/2013  ? Last Assessment & Plan:  Will review previous records from previous PCP, pt had thyroid done recently and  was wnl. Pt had questions re lupus/MS and how to get tested for these.   Last Assessment & Plan:  Formatting of this note might be different from the original. Will review previous records from previous PCP, pt had thyroid done recently and was wnl. Pt had questions re lupus/MS and how to get   ? GERD (gastroesophageal reflux disease)   ? Greater trochanteric bursitis of left hip 08/30/2020  ? Injection given August 30, 2020  ? Hypertension   ? Lateral epicondylitis of both elbows 08/06/2018  ? Left knee pain 08/30/2020  ? Low TSH level 04/22/2013  ? Mixed dyslipidemia 02/22/2020  ? Myalgia 07/06/2013  ? Last Assessment & Plan:  Formatting of this note might be different from the original. Will check labs today including sed rate, ANA/RF.  Last Assessment & Plan:  Formatting of this note might be different from the original. Pt would like to be tested for lupus in the future. Has been tested for RA and was negative  ? Other and unspecified hyperlipidemia 04/06/2011  ? Palpitations 10/19/2019  ? Perimenopausal 05/15/2013  ? Periodic edema 04/04/2013  ? Pt reports taking "water pill" for many years due to feeling "swollen". Historically, took HCTZ but became hypokalemic. She was switched to triamterene-HCTZ with no more hypokalemic episodes.    ? Preventative health care 04/04/2013  ? Primary localized osteoarthritis of right knee 10/13/2014  ? Squamous cell carcinoma, face 02/24/2014  ? Tear of medial  meniscus of right knee 10/13/2014  ? Vitamin B12 deficiency 05/15/2013  ? Last Assessment & Plan:  Formatting of this note might be different from the original. Recheck level today, may need to restart monthly Vit B12 shots  ? Vitamin D deficiency 02/24/2014  ? Weight gain 04/24/2015  ? ?Family History  ?Problem Relation Age of Onset  ? Colon polyps Mother   ? Hyperlipidemia Mother   ? Breast cancer Mother 68  ?     breast  ? Heart attack Father   ? COPD Father   ? Colon polyps Sister   ? Heart attack Sister   ? Hyperlipidemia Sister   ?  Hypertension Sister   ? Heart attack Sister   ? Hyperlipidemia Maternal Aunt   ? Breast cancer Maternal Aunt   ?     breast  ? Hypertension Maternal Uncle   ? Diabetes Maternal Uncle   ? Colon cancer Maternal Uncle   ?     colon  ? Hyperlipidemia Maternal Grandmother   ? Diabetes Maternal Grandmother   ? Breast cancer Maternal Grandmother   ?     breast  ? Diabetes Maternal Grandfather   ? Sudden death Neg Hx   ? Esophageal cancer Neg Hx   ? Rectal cancer Neg Hx   ? Stomach cancer Neg Hx   ? ?Past Surgical History:  ?Procedure Laterality Date  ? ABDOMINAL HYSTERECTOMY  2007  ? KNEE ARTHROSCOPY WITH MEDIAL MENISECTOMY Right 10/13/2014  ? Procedure: RIGHT KNEE ARTHROSCOPY WITH PARTIAL MEDIAL MENISECTOMY AND CHONDROPLASTY ;  Surgeon: Marchia Bond, MD;  Location: [place];  Service: Orthopedics;  Laterality: Right;  ? ? ?Current Outpatient Medications:  ?  aspirin 81 MG chewable tablet, Chew 1 tablet by mouth twice daily with meals for 45 days., Disp: 108 tablet, Rfl: 0 ?  cyanocobalamin (,VITAMIN B-12,) 1000 MCG/ML injection, INJECT 1ML INTO THE SKIN EVERY 2 WEEKS, Disp: 10 mL, Rfl: 0 ?  Diclofenac Sodium (PENNSAID) 2 % SOLN, Place 1 application onto the skin 2 (two) times daily., Disp: 112 g, Rfl: 3 ?  furosemide (LASIX) 40 MG tablet, Take 1 tablet (40 mg total) by mouth daily., Disp: 30 tablet, Rfl: 3 ?  meloxicam (MOBIC) 15 MG tablet, Take 1 tablet by mouth daily with meals., Disp: 30 tablet, Rfl: 3 ?  potassium chloride (KLOR-CON M) 10 MEQ tablet, Take 2 tablets by mouth with every dose of Lasix., Disp: 60 tablet, Rfl: 2 ?  rosuvastatin (CRESTOR) 10 MG tablet, Take 1 tablet (10 mg total) by mouth daily., Disp: 90 tablet, Rfl: 1 ?  Vitamin D, Ergocalciferol, (DRISDOL) 1.25 MG (50000 UNIT) CAPS capsule, Take 1 capsule (50,000 Units total) by mouth every 7 (seven) days., Disp: 12 capsule, Rfl: 0 ? ?BP 120/76   Pulse 84   Temp 98.1 ?F (36.7 ?C) (Oral)   Ht 5' 1.5" (1.562 m)   Wt 193 lb 4 oz (87.7  kg)   SpO2 95%   BMI 35.92 kg/m?  ?Gen- awake, alert, appears stated age ?Heart- RRR, no murmurs, 1+ pitting bilateral LE edema around the ankles ?Lungs- CTAB, normal effort w/o accessory muscle use ?MSK-no calf pain bilaterally ?Psych: Age appropriate judgment and insight ? ?Localized swelling of both lower extremities - Plan: furosemide (LASIX) 40 MG tablet, potassium chloride (KLOR-CON M) 10 MEQ tablet, Basic Metabolic Panel (BMET) ? ?Chronic, not controlled.  Continue to wear compression stockings, elevate legs, reduce salt intake, and stay physically active.  Trial Lasix 40  mg daily, add potassium 20 mEq with each dosage.  Check BMP in 1 week.  I will see her in 1 month to recheck this. ?Pt voiced understanding and agreement to the plan. ? ?[name], DO ?06/05/21  ?11:55 AM ? ? ?

## 2021-06-11 ENCOUNTER — Ambulatory Visit: Payer: BC Managed Care – PPO | Admitting: Family Medicine

## 2021-06-13 ENCOUNTER — Other Ambulatory Visit (INDEPENDENT_AMBULATORY_CARE_PROVIDER_SITE_OTHER): Payer: BC Managed Care – PPO

## 2021-06-13 DIAGNOSIS — M7989 Other specified soft tissue disorders: Secondary | ICD-10-CM

## 2021-06-13 LAB — BASIC METABOLIC PANEL
BUN: 13 mg/dL (ref 6–23)
CO2: 30 mEq/L (ref 19–32)
Calcium: 9.7 mg/dL (ref 8.4–10.5)
Chloride: 103 mEq/L (ref 96–112)
Creatinine, Ser: 0.82 mg/dL (ref 0.40–1.20)
GFR: 79.66 mL/min (ref >60.00)
Glucose, Bld: 85 mg/dL (ref 70–99)
Potassium: 3.8 mEq/L (ref 3.5–5.1)
Sodium: 141 mEq/L (ref 135–145)

## 2021-07-02 ENCOUNTER — Other Ambulatory Visit (HOSPITAL_BASED_OUTPATIENT_CLINIC_OR_DEPARTMENT_OTHER): Payer: Self-pay

## 2021-07-09 NOTE — Progress Notes (Unsigned)
[name] [address] [place] Phone: [phone] Subjective:   IVilma Meckel, am serving as a scribe for Dr. Hulan Saas.  I'm seeing this patient by the request  of:  [name], DO  CC: Knee pain follow-up  [id number]  06/04/2021 Patient given injection today.  Tolerated the procedure well, discussed icing regimen and home exercises, could be candidate for viscosupplementation which patient did respond to.  Likely this is secondary to exacerbation from patient's most recent hip surgery.  Follow-up with me again in 6 weeks otherwise.  Update 07/10/2021 Stephanie Castro is a 56 y.o. female coming in with complaint of R knee pain.  Known to have moderate to severe arthritic changes.  At last exam patient was given injection.  Seem to be an exacerbation of pain after her hip surgery.  Patient states injections helped a little. Hip doing well. Knee pain has gotten a little better. No new complaints.      Past Medical History:  Diagnosis Date   Arthritis    both knees , back and elbows   BMI 35.0-35.9,adult 05/15/2013   Breast cancer screening, high risk patient 04/04/2013   Pt states she had mmg 2015 nml results, ordered by Dr  Ruben Gottron, [facility] health care. Results are not in Care everywhere. Mammogram completed 06/12/2015 at [facility] in [place], [place] Category 1    Cardiac murmur 10/19/2019   Chronic pain of right knee 04/13/2020   Class 2 severe obesity due to excess calories with serious comorbidity and body mass index (BMI) of 35.0 to 35.9 in adult [facility]) 09/14/2020   Concern about female breast disease without diagnosis 03/17/2018   DOE (dyspnea on exertion) 08/08/2015   Dysplastic nevus    beneath r eye   Edema of extremities 07/07/2013   Last Assessment & Plan:  Pt takes HCTZ daily.   Last Assessment & Plan:  Formatting of this note might be  different from the original. Pt takes HCTZ daily.   Elevated BP without diagnosis of hypertension 06/15/2015   Elevated uric acid in blood 05/02/2020   Essential hypertension 02/26/2017   Exercise-induced asthma    Fatigue 07/06/2013   Last Assessment & Plan:  Will review previous records from previous PCP, pt had thyroid done recently and was wnl. Pt had questions re lupus/MS and how to get tested for these.   Last Assessment & Plan:  Formatting of this note might be different from the original. Will review previous records from previous PCP, pt had thyroid done recently and was wnl. Pt had questions re lupus/MS and how to get    GERD (gastroesophageal reflux disease)    Greater trochanteric bursitis of left hip 08/30/2020   Injection given August 30, 2020   Hypertension    Lateral epicondylitis of both elbows 08/06/2018   Left knee pain 08/30/2020   Low TSH level 04/22/2013   Mixed dyslipidemia 02/22/2020   Myalgia 07/06/2013   Last Assessment & Plan:  Formatting of this note might be different from the original. Will check labs today including sed rate, ANA/RF.  Last Assessment & Plan:  Formatting of this note might be different from the original. Pt would like to be tested for lupus in the future. Has been tested for RA and was negative   Other and unspecified hyperlipidemia 04/06/2011   Palpitations 10/19/2019   Perimenopausal 05/15/2013   Periodic edema 04/04/2013  Pt reports taking "water pill" for many years due to feeling "swollen". Historically, took HCTZ but became hypokalemic. She was switched to triamterene-HCTZ with no more hypokalemic episodes.     Preventative health care 04/04/2013   Primary localized osteoarthritis of right knee 10/13/2014   Squamous cell carcinoma, face 02/24/2014   Tear of medial meniscus of right knee 10/13/2014   Vitamin B12 deficiency 05/15/2013   Last Assessment & Plan:  Formatting of this note might be different from the original. Recheck level today, may need to restart  monthly Vit B12 shots   Vitamin D deficiency 02/24/2014   Weight gain 04/24/2015   Past Surgical History:  Procedure Laterality Date   ABDOMINAL HYSTERECTOMY  2007   KNEE ARTHROSCOPY WITH MEDIAL MENISECTOMY Right 10/13/2014   Procedure: RIGHT KNEE ARTHROSCOPY WITH PARTIAL MEDIAL MENISECTOMY AND CHONDROPLASTY ;  Surgeon: Marchia Bond, MD;  Location: [place];  Service: Orthopedics;  Laterality: Right;   Social History   Socioeconomic History   Marital status: Married    Spouse name: Not on file   Number of children: Not on file   Years of education: Not on file   Highest education level: Not on file  Occupational History   Not on file  Tobacco Use   Smoking status: Former    Packs/day: 1.00    Years: 20.00    Total pack years: 20.00    Types: Cigarettes    Quit date: 2005    Years since quitting: 18.4   Smokeless tobacco: Never  Vaping Use   Vaping Use: Never used  Substance and Sexual Activity   Alcohol use: Yes    Alcohol/week: 2.0 standard drinks of alcohol    Types: 1 Glasses of wine, 1 Shots of liquor per week   Drug use: Never   Sexual activity: Yes    Birth control/protection: Surgical  Other Topics Concern   Not on file  Social History Narrative   Married to [place].    Social Determinants of Health   Financial Resource Strain: Not on file  Food Insecurity: Not on file  Transportation Needs: Not on file  Physical Activity: Not on file  Stress: Not on file  Social Connections: Not on file   Allergies  Allergen Reactions   Penicillins    Family History  Problem Relation Age of Onset   Colon polyps Mother    Hyperlipidemia Mother    Breast cancer Mother 82       breast   Heart attack Father    COPD Father    Colon polyps Sister    Heart attack Sister    Hyperlipidemia Sister    Hypertension Sister    Heart attack Sister    Hyperlipidemia Maternal Aunt    Breast cancer Maternal Aunt        breast   Hypertension Maternal Uncle     Diabetes Maternal Uncle    Colon cancer Maternal Uncle        colon   Hyperlipidemia Maternal Grandmother    Diabetes Maternal Grandmother    Breast cancer Maternal Grandmother        breast   Diabetes Maternal Grandfather    Sudden death Neg Hx    Esophageal cancer Neg Hx    Rectal cancer Neg Hx    Stomach cancer Neg Hx      Current Outpatient Medications (Cardiovascular):    furosemide (LASIX) 40 MG tablet, Take 1 tablet (40 mg total) by mouth daily.  rosuvastatin (CRESTOR) 10 MG tablet, Take 1 tablet (10 mg total) by mouth daily.   Current Outpatient Medications (Analgesics):    aspirin 81 MG chewable tablet, Chew 1 tablet by mouth twice daily with meals for 45 days.   meloxicam (MOBIC) 15 MG tablet, Take 1 tablet by mouth daily with meals.  Current Outpatient Medications (Hematological):    cyanocobalamin (,VITAMIN B-12,) 1000 MCG/ML injection, INJECT 1ML INTO THE SKIN EVERY 2 WEEKS  Current Outpatient Medications (Other):    Diclofenac Sodium (PENNSAID) 2 % SOLN, Place 1 application onto the skin 2 (two) times daily.   potassium chloride (KLOR-CON M) 10 MEQ tablet, Take 2 tablets by mouth with every dose of Lasix.   Vitamin D, Ergocalciferol, (DRISDOL) 1.25 MG (50000 UNIT) CAPS capsule, Take 1 capsule (50,000 Units total) by mouth every 7 (seven) days.   Reviewed prior external information including notes and imaging from  primary care provider As well as notes that were available from care everywhere and other healthcare systems.  Past medical history, social, surgical and family history all reviewed in electronic medical record.  No pertanent information unless stated regarding to the chief complaint.   Review of Systems:  No headache, visual changes, nausea, vomiting, diarrhea, constipation, dizziness, abdominal pain, skin rash, fevers, chills, night sweats, weight loss, swollen lymph nodes, body aches, joint swelling, chest pain, shortness of breath, mood changes.  POSITIVE muscle aches  Objective  Blood pressure 122/78, pulse 63, height '5\' 1"'$  (6.294 m), weight 191 lb (86.6 kg), SpO2 95 %.   General: No apparent distress alert and oriented x3 mood and affect normal, dressed appropriately.  HEENT: Pupils equal, extraocular movements intact  Respiratory: Patient's speak in full sentences and does not appear short of breath  Cardiovascular: No lower extremity edema, non tender, no erythema  Still antalgic gait noted.  Seems to be favoring the right knee more than anything else.  Trace effusion of the knees bilaterally left hip does have an anterior nodule noted.  Just inferior to the incision from patient's anterior hip replacement.  Limited muscular skeletal ultrasound was performed and interpreted by Hulan Saas, M  Limited ultrasound of patient's left hip shows the patient does have some scar tissue formation in the soft tissue  It does have a hypoechoic change that does have good encapsulation that is consistent with a cyst or seroma noted right either under or within the muscle itself. Impression: Seroma likely postoperative   Impression and Recommendations:     The above documentation has been reviewed and is accurate and complete Lyndal Pulley, DO

## 2021-07-10 ENCOUNTER — Ambulatory Visit: Payer: Self-pay

## 2021-07-10 ENCOUNTER — Ambulatory Visit: Payer: BC Managed Care – PPO | Admitting: Family Medicine

## 2021-07-10 VITALS — BP 122/78 | HR 63 | Ht 61.0 in | Wt 191.0 lb

## 2021-07-10 DIAGNOSIS — M96842 Postprocedural seroma of a musculoskeletal structure following a musculoskeletal system procedure: Secondary | ICD-10-CM | POA: Insufficient documentation

## 2021-07-10 DIAGNOSIS — G8929 Other chronic pain: Secondary | ICD-10-CM | POA: Diagnosis not present

## 2021-07-10 DIAGNOSIS — M1711 Unilateral primary osteoarthritis, right knee: Secondary | ICD-10-CM

## 2021-07-10 DIAGNOSIS — M25561 Pain in right knee: Secondary | ICD-10-CM

## 2021-07-10 HISTORY — DX: Postprocedural seroma of a musculoskeletal structure following a musculoskeletal system procedure: M96.842

## 2021-07-10 NOTE — Assessment & Plan Note (Signed)
Patient does have arthritic changes of the knees bilaterally.  Discussed with patient at great length to discuss different treatment options and patient has elected to schedule the possibility of PRP.  Patient should do that in the next 72 hours. We discussed the post PRP treatment plan patient declined the viscosupplementation.  Total time discussing with patient today as well as reviewing patient's previous imaging 31 minutes

## 2021-07-10 NOTE — Assessment & Plan Note (Signed)
On ultrasound patient does have what appears to be a seroma noted.  Does seem to be more fluid filled.  No sign of any infectious etiology.  Patient does have scar tissue formation noted below.  We discussed with patient about icing regimen and home exercises and compression.  Worsening pain can consider the possibility of aspiration.  Patient will consider this.

## 2021-07-10 NOTE — Patient Instructions (Addendum)
PRP 12:45pm Friday, come to lab at 12:30pm Hip looks like seroma

## 2021-07-11 NOTE — Progress Notes (Signed)
[place] Phone: [phone] Subjective:   [name], am serving as a scribe for Dr. Hulan Saas.   I'm seeing this patient by the request  of:  Stephanie Pal, DO  CC: Right knee pain follow-up  [id number]  07/10/2021 was seen for knee pain  Stephanie Castro is a 56 y.o. female coming in with complaint of R knee pain. Here for PRP. Pain is not as bad as it has been but pain is still present.        Past Medical History:  Diagnosis Date   Arthritis    both knees , back and elbows   BMI 35.0-35.9,adult 05/15/2013   Breast cancer screening, high risk patient 04/04/2013   Pt states she had mmg 2015 nml results, ordered by Dr  Ruben Gottron, [facility] health care. Results are not in Care everywhere. Mammogram completed 06/12/2015 at [facility] in [place], [place] Category 1    Cardiac murmur 10/19/2019   Chronic pain of right knee 04/13/2020   Class 2 severe obesity due to excess calories with serious comorbidity and body mass index (BMI) of 35.0 to 35.9 in adult [facility]) 09/14/2020   Concern about female breast disease without diagnosis 03/17/2018   DOE (dyspnea on exertion) 08/08/2015   Dysplastic nevus    beneath r eye   Edema of extremities 07/07/2013   Last Assessment & Plan:  Pt takes HCTZ daily.   Last Assessment & Plan:  Formatting of this note might be different from the original. Pt takes HCTZ daily.   Elevated BP without diagnosis of hypertension 06/15/2015   Elevated uric acid in blood 05/02/2020   Essential hypertension 02/26/2017   Exercise-induced asthma    Fatigue 07/06/2013   Last Assessment & Plan:  Will review previous records from previous PCP, pt had thyroid done recently and was wnl. Pt had questions re lupus/MS and how to get tested for these.   Last Assessment & Plan:  Formatting of this note might be different from  the original. Will review previous records from previous PCP, pt had thyroid done recently and was wnl. Pt had questions re lupus/MS and how to get    GERD (gastroesophageal reflux disease)    Greater trochanteric bursitis of left hip 08/30/2020   Injection given August 30, 2020   Hypertension    Lateral epicondylitis of both elbows 08/06/2018   Left knee pain 08/30/2020   Low TSH level 04/22/2013   Mixed dyslipidemia 02/22/2020   Myalgia 07/06/2013   Last Assessment & Plan:  Formatting of this note might be different from the original. Will check labs today including sed rate, ANA/RF.  Last Assessment & Plan:  Formatting of this note might be different from the original. Pt would like to be tested for lupus in the future. Has been tested for RA and was negative   Other and unspecified hyperlipidemia 04/06/2011   Palpitations 10/19/2019   Perimenopausal 05/15/2013   Periodic edema 04/04/2013   Pt reports taking "water pill" for many years due to feeling "swollen". Historically, took HCTZ but became hypokalemic. She was switched to triamterene-HCTZ with no more hypokalemic episodes.     Preventative health care 04/04/2013   Primary localized osteoarthritis of right knee 10/13/2014   Squamous cell carcinoma, face 02/24/2014   Tear of medial meniscus of right knee 10/13/2014   Vitamin B12 deficiency 05/15/2013  Last Assessment & Plan:  Formatting of this note might be different from the original. Recheck level today, may need to restart monthly Vit B12 shots   Vitamin D deficiency 02/24/2014   Weight gain 04/24/2015   Past Surgical History:  Procedure Laterality Date   ABDOMINAL HYSTERECTOMY  2007   KNEE ARTHROSCOPY WITH MEDIAL MENISECTOMY Right 10/13/2014   Procedure: RIGHT KNEE ARTHROSCOPY WITH PARTIAL MEDIAL MENISECTOMY AND CHONDROPLASTY ;  Surgeon: Marchia Bond, MD;  Location: [place];  Service: Orthopedics;  Laterality: Right;   Social History   Socioeconomic History   Marital  status: Married    Spouse name: Not on file   Number of children: Not on file   Years of education: Not on file   Highest education level: Not on file  Occupational History   Not on file  Tobacco Use   Smoking status: Former    Packs/day: 1.00    Years: 20.00    Total pack years: 20.00    Types: Cigarettes    Quit date: 2005    Years since quitting: 18.4   Smokeless tobacco: Never  Vaping Use   Vaping Use: Never used  Substance and Sexual Activity   Alcohol use: Yes    Alcohol/week: 2.0 standard drinks of alcohol    Types: 1 Glasses of wine, 1 Shots of liquor per week   Drug use: Never   Sexual activity: Yes    Birth control/protection: Surgical  Other Topics Concern   Not on file  Social History Narrative   Married to [place].    Social Determinants of Health   Financial Resource Strain: Not on file  Food Insecurity: Not on file  Transportation Needs: Not on file  Physical Activity: Not on file  Stress: Not on file  Social Connections: Not on file   Allergies  Allergen Reactions   Penicillins    Family History  Problem Relation Age of Onset   Colon polyps Mother    Hyperlipidemia Mother    Breast cancer Mother 75       breast   Heart attack Father    COPD Father    Colon polyps Sister    Heart attack Sister    Hyperlipidemia Sister    Hypertension Sister    Heart attack Sister    Hyperlipidemia Maternal Aunt    Breast cancer Maternal Aunt        breast   Hypertension Maternal Uncle    Diabetes Maternal Uncle    Colon cancer Maternal Uncle        colon   Hyperlipidemia Maternal Grandmother    Diabetes Maternal Grandmother    Breast cancer Maternal Grandmother        breast   Diabetes Maternal Grandfather    Sudden death Neg Hx    Esophageal cancer Neg Hx    Rectal cancer Neg Hx    Stomach cancer Neg Hx      Current Outpatient Medications (Cardiovascular):    furosemide (LASIX) 40 MG tablet, Take 1 tablet (40 mg total) by mouth daily.    rosuvastatin (CRESTOR) 10 MG tablet, Take 1 tablet (10 mg total) by mouth daily.   Current Outpatient Medications (Analgesics):    aspirin 81 MG chewable tablet, Chew 1 tablet by mouth twice daily with meals for 45 days.   meloxicam (MOBIC) 15 MG tablet, Take 1 tablet by mouth daily with meals.  Current Outpatient Medications (Hematological):    cyanocobalamin (,VITAMIN B-12,) 1000 MCG/ML injection, INJECT  1ML INTO THE SKIN EVERY 2 WEEKS  Current Outpatient Medications (Other):    Diclofenac Sodium (PENNSAID) 2 % SOLN, Place 1 application onto the skin 2 (two) times daily.   potassium chloride (KLOR-CON M) 10 MEQ tablet, Take 2 tablets by mouth with every dose of Lasix.   Vitamin D, Ergocalciferol, (DRISDOL) 1.25 MG (50000 UNIT) CAPS capsule, Take 1 capsule (50,000 Units total) by mouth every 7 (seven) days.    Objective  Blood pressure 122/84, pulse 74, height '5\' 1"'$  (1.549 m), weight 192 lb (87.1 kg), SpO2 98 %.   General: No apparent distress alert and oriented x3 mood and affect normal, dressed appropriately.   After informed written and verbal consent, patient was seated on exam table. Right knee was prepped with alcohol swab and utilizing anterolateral approach, patient's right knee space was injected with 2 cc of 0.5% Marcaine and then injected with 5 cc of PRP leukocyte poor . Patient tolerated the procedure well without immediate complications.    Impression and Recommendations:

## 2021-07-12 ENCOUNTER — Ambulatory Visit: Payer: BC Managed Care – PPO | Admitting: Family Medicine

## 2021-07-12 ENCOUNTER — Encounter: Payer: Self-pay | Admitting: Family Medicine

## 2021-07-12 ENCOUNTER — Ambulatory Visit: Payer: Self-pay | Admitting: Family Medicine

## 2021-07-12 DIAGNOSIS — M1711 Unilateral primary osteoarthritis, right knee: Secondary | ICD-10-CM

## 2021-07-12 NOTE — Patient Instructions (Signed)
No ice or Ibu for 3 days Heat and Tylenol are ok See me again in 7-8 weeks

## 2021-07-12 NOTE — Assessment & Plan Note (Signed)
Discussed with patient at great length.  At this point I do think the patient will do well with the PRP.  Does have arthritic changes patient hopefully will do very well with this.  Did not respond as well to the viscosupplementation and the steroid injection previously well.  Patient encouraged to continue to stay active otherwise.  Follow-up with me again in 6 to 8 weeks.

## 2021-07-29 ENCOUNTER — Other Ambulatory Visit (HOSPITAL_BASED_OUTPATIENT_CLINIC_OR_DEPARTMENT_OTHER): Payer: Self-pay

## 2021-08-05 NOTE — Progress Notes (Signed)
[place] Phone: [phone] Subjective:   Stephanie Castro, am serving as a scribe for Dr. Hulan Saas.   I'm seeing this patient by the request  of:  Stephanie Pal, DO  CC: Left hip pain and swelling  HDQ:[id number]  07/12/2021 Discussed with patient at great length.  At this point I do think the patient will do well with the PRP.  Does have arthritic changes patient hopefully will do very well with this.  Did not respond as well to the viscosupplementation and the steroid injection previously well.  Patient encouraged to continue to stay active otherwise.  Follow-up with me again in 6 to 8 weeks.  Updated 08/09/2021 Stephanie Castro is a 56 y.o. female coming in with complaint of right knee pain. PRP f/u. States that proximal quad is swollen and numb. Notes swelling is increasing. Feels pressure within joint. Has been some heat. Has been traveling and walking a lot recently.  Patient is wondering what to do .       Past Medical History:  Diagnosis Date   Arthritis    both knees , back and elbows   BMI 35.0-35.9,adult 05/15/2013   Breast cancer screening, high risk patient 04/04/2013   Pt states she had mmg 2015 nml results, ordered by Dr  Ruben Gottron, [facility] health care. Results are not in Care everywhere. Mammogram completed 06/12/2015 at [facility] in [place], [place] Category 1    Cardiac murmur 10/19/2019   Chronic pain of right knee 04/13/2020   Class 2 severe obesity due to excess calories with serious comorbidity and body mass index (BMI) of 35.0 to 35.9 in adult [facility]) 09/14/2020   Concern about female breast disease without diagnosis 03/17/2018   DOE (dyspnea on exertion) 08/08/2015   Dysplastic nevus    beneath r eye   Edema of extremities 07/07/2013   Last Assessment & Plan:  Pt takes HCTZ daily.   Last Assessment & Plan:   Formatting of this note might be different from the original. Pt takes HCTZ daily.   Elevated BP without diagnosis of hypertension 06/15/2015   Elevated uric acid in blood 05/02/2020   Essential hypertension 02/26/2017   Exercise-induced asthma    Fatigue 07/06/2013   Last Assessment & Plan:  Will review previous records from previous PCP, pt had thyroid done recently and was wnl. Pt had questions re lupus/MS and how to get tested for these.   Last Assessment & Plan:  Formatting of this note might be different from the original. Will review previous records from previous PCP, pt had thyroid done recently and was wnl. Pt had questions re lupus/MS and how to get    GERD (gastroesophageal reflux disease)    Greater trochanteric bursitis of left hip 08/30/2020   Injection given August 30, 2020   Hypertension    Lateral epicondylitis of both elbows 08/06/2018   Left knee pain 08/30/2020   Low TSH level 04/22/2013   Mixed dyslipidemia 02/22/2020   Myalgia 07/06/2013   Last Assessment & Plan:  Formatting of this note might be different from the original. Will check labs today including sed rate, ANA/RF.  Last Assessment & Plan:  Formatting of this note might be different from the original. Pt would like to be tested for lupus in the future. Has been tested for RA and was negative   Other and unspecified hyperlipidemia  04/06/2011   Palpitations 10/19/2019   Perimenopausal 05/15/2013   Periodic edema 04/04/2013   Pt reports taking "water pill" for many years due to feeling "swollen". Historically, took HCTZ but became hypokalemic. She was switched to triamterene-HCTZ with Castro more hypokalemic episodes.     Preventative health care 04/04/2013   Primary localized osteoarthritis of right knee 10/13/2014   Squamous cell carcinoma, face 02/24/2014   Tear of medial meniscus of right knee 10/13/2014   Vitamin B12 deficiency 05/15/2013   Last Assessment & Plan:  Formatting of this note might be different from the original. Recheck  level today, may need to restart monthly Vit B12 shots   Vitamin D deficiency 02/24/2014   Weight gain 04/24/2015   Past Surgical History:  Procedure Laterality Date   ABDOMINAL HYSTERECTOMY  2007   KNEE ARTHROSCOPY WITH MEDIAL MENISECTOMY Right 10/13/2014   Procedure: RIGHT KNEE ARTHROSCOPY WITH PARTIAL MEDIAL MENISECTOMY AND CHONDROPLASTY ;  Surgeon: Marchia Bond, MD;  Location: [facility] [facility];  Service: Orthopedics;  Laterality: Right;   Social History   Socioeconomic History   Marital status: Married    Spouse name: Not on file   Number of children: Not on file   Years of education: Not on file   Highest education level: Not on file  Occupational History   Not on file  Tobacco Use   Smoking status: Former    Packs/day: 1.00    Years: 20.00    Total pack years: 20.00    Types: Cigarettes    Quit date: 2005    Years since quitting: 18.5   Smokeless tobacco: Never  Vaping Use   Vaping Use: Never used  Substance and Sexual Activity   Alcohol use: Yes    Alcohol/week: 2.0 standard drinks of alcohol    Types: 1 Glasses of wine, 1 Shots of liquor per week   Drug use: Never   Sexual activity: Yes    Birth control/protection: Surgical  Other Topics Concern   Not on file  Social History Narrative   Married to [place].    Social Determinants of Health   Financial Resource Strain: Not on file  Food Insecurity: Not on file  Transportation Needs: Not on file  Physical Activity: Not on file  Stress: Not on file  Social Connections: Not on file   Allergies  Allergen Reactions   Penicillins    Family History  Problem Relation Age of Onset   Colon polyps Mother    Hyperlipidemia Mother    Breast cancer Mother 24       breast   Heart attack Father    COPD Father    Colon polyps Sister    Heart attack Sister    Hyperlipidemia Sister    Hypertension Sister    Heart attack Sister    Hyperlipidemia Maternal Aunt    Breast cancer Maternal Aunt        breast    Hypertension Maternal Uncle    Diabetes Maternal Uncle    Colon cancer Maternal Uncle        colon   Hyperlipidemia Maternal Grandmother    Diabetes Maternal Grandmother    Breast cancer Maternal Grandmother        breast   Diabetes Maternal Grandfather    Sudden death Neg Hx    Esophageal cancer Neg Hx    Rectal cancer Neg Hx    Stomach cancer Neg Hx      Current Outpatient Medications (Cardiovascular):  furosemide (LASIX) 40 MG tablet, Take 1 tablet (40 mg total) by mouth daily.   rosuvastatin (CRESTOR) 10 MG tablet, Take 1 tablet (10 mg total) by mouth daily.   Current Outpatient Medications (Analgesics):    aspirin 81 MG chewable tablet, Chew 1 tablet by mouth twice daily with meals for 45 days.   meloxicam (MOBIC) 15 MG tablet, Take 1 tablet by mouth daily with meals.  Current Outpatient Medications (Hematological):    cyanocobalamin (,VITAMIN B-12,) 1000 MCG/ML injection, INJECT 1ML INTO THE SKIN EVERY 2 WEEKS  Current Outpatient Medications (Other):    Diclofenac Sodium (PENNSAID) 2 % SOLN, Place 1 application onto the skin 2 (two) times daily.   potassium chloride (KLOR-CON M) 10 MEQ tablet, Take 2 tablets by mouth with every dose of Lasix.   Vitamin D, Ergocalciferol, (DRISDOL) 1.25 MG (50000 UNIT) CAPS capsule, Take 1 capsule (50,000 Units total) by mouth every 7 (seven) days.    Review of Systems:  Castro headache, visual changes, nausea, vomiting, diarrhea, constipation, dizziness, abdominal pain, skin rash, fevers, chills, night sweats, weight loss, swollen lymph nodes, body aches, joint swelling, chest pain, shortness of breath, mood changes. POSITIVE muscle aches  Objective  Blood pressure 138/84, pulse 79, height '5\' 1"'$  (1.549 m), weight 197 lb (89.4 kg), SpO2 98 %.   General: Castro apparent distress alert and oriented x3 mood and affect normal, dressed appropriately.  HEENT: Pupils equal, extraocular movements intact  Respiratory: Patient's speak in full  sentences and does not appear short of breath  Cardiovascular: Castro lower extremity edema, non tender, Castro erythema  Mild antalgic gait.  Patient does have swelling to the anterior aspect of the hip.  This is just inferior to the incision from patient's surgery.  Limited muscular skeletal ultrasound was performed and interpreted by Hulan Saas, M  Limited ultrasound shows the patient does have approximately 1-1/2 cm deep to the skin in area of the seroma that is initially a 4 x 3 cm hypoechoic mass noted.  Castro irregularities in the vascularity. Impression: Likely postoperative seroma of the hip    Impression and Recommendations:    The above documentation has been reviewed and is accurate and complete Stephanie Pulley, DO

## 2021-08-09 ENCOUNTER — Other Ambulatory Visit (HOSPITAL_BASED_OUTPATIENT_CLINIC_OR_DEPARTMENT_OTHER): Payer: Self-pay

## 2021-08-09 ENCOUNTER — Ambulatory Visit: Payer: Self-pay

## 2021-08-09 ENCOUNTER — Ambulatory Visit (INDEPENDENT_AMBULATORY_CARE_PROVIDER_SITE_OTHER): Payer: BC Managed Care – PPO | Admitting: Family Medicine

## 2021-08-09 ENCOUNTER — Encounter: Payer: Self-pay | Admitting: Family Medicine

## 2021-08-09 VITALS — BP 138/84 | HR 79 | Ht 61.0 in | Wt 197.0 lb

## 2021-08-09 DIAGNOSIS — M25561 Pain in right knee: Secondary | ICD-10-CM | POA: Diagnosis not present

## 2021-08-09 DIAGNOSIS — M96842 Postprocedural seroma of a musculoskeletal structure following a musculoskeletal system procedure: Secondary | ICD-10-CM

## 2021-08-09 NOTE — Assessment & Plan Note (Signed)
Discussed with patient again  seroma again at this point.  Patient is concerned that this may take some time for multiple aspirations.  We discussed that if she may talk to the orthopedic surgeon that a drain may be able to be placed.  Patient will follow-up with surgeon and discuss treatment options there.  We will send note to surgeon.

## 2021-08-09 NOTE — Patient Instructions (Signed)
Call Dr. Delfino Lovett We will send info Please let us know if you haven't heard by Tuesday We are here if you have questions

## 2021-08-13 ENCOUNTER — Telehealth: Payer: Self-pay | Admitting: Family Medicine

## 2021-08-13 ENCOUNTER — Encounter: Payer: Self-pay | Admitting: Family Medicine

## 2021-08-13 DIAGNOSIS — Z96642 Presence of left artificial hip joint: Secondary | ICD-10-CM | POA: Diagnosis not present

## 2021-08-13 DIAGNOSIS — Z471 Aftercare following joint replacement surgery: Secondary | ICD-10-CM | POA: Diagnosis not present

## 2021-08-13 NOTE — Telephone Encounter (Signed)
Pt called to advise she was told by Dr. Tamala Julian to let us know if she had not heard from Dr. Delfino Lovett by today.  As no referral was done, and last OV notes document request for pt to contact Dr. Delfino Lovett, I informed pt. She seemed frustrated by this and thought we were reaching out.  Informed pt to ctc Dr. Delfino Lovett and advise [place] of outcome. FYI

## 2021-08-13 NOTE — Telephone Encounter (Signed)
Spoke with patient. She has appointment today at 2pm at [facility].

## 2021-08-14 ENCOUNTER — Other Ambulatory Visit (HOSPITAL_BASED_OUTPATIENT_CLINIC_OR_DEPARTMENT_OTHER): Payer: Self-pay

## 2021-08-14 MED ORDER — METHOCARBAMOL 500 MG PO TABS
ORAL_TABLET | ORAL | 0 refills | Status: DC
  Filled 2021-08-14: qty 20, 5d supply, fill #0

## 2021-08-19 ENCOUNTER — Other Ambulatory Visit (HOSPITAL_BASED_OUTPATIENT_CLINIC_OR_DEPARTMENT_OTHER): Payer: Self-pay

## 2021-08-20 ENCOUNTER — Ambulatory Visit: Payer: BC Managed Care – PPO

## 2021-08-20 DIAGNOSIS — G4733 Obstructive sleep apnea (adult) (pediatric): Secondary | ICD-10-CM

## 2021-08-26 ENCOUNTER — Ambulatory Visit: Payer: BC Managed Care – PPO

## 2021-08-26 DIAGNOSIS — R0683 Snoring: Secondary | ICD-10-CM

## 2021-08-27 ENCOUNTER — Ambulatory Visit: Payer: BC Managed Care – PPO | Admitting: Internal Medicine

## 2021-08-30 DIAGNOSIS — R0683 Snoring: Secondary | ICD-10-CM | POA: Diagnosis not present

## 2021-09-03 ENCOUNTER — Other Ambulatory Visit (HOSPITAL_BASED_OUTPATIENT_CLINIC_OR_DEPARTMENT_OTHER): Payer: Self-pay

## 2021-09-12 ENCOUNTER — Other Ambulatory Visit: Payer: Self-pay | Admitting: Family Medicine

## 2021-09-12 ENCOUNTER — Other Ambulatory Visit (HOSPITAL_BASED_OUTPATIENT_CLINIC_OR_DEPARTMENT_OTHER): Payer: Self-pay

## 2021-09-12 DIAGNOSIS — M7989 Other specified soft tissue disorders: Secondary | ICD-10-CM

## 2021-09-12 MED ORDER — POTASSIUM CHLORIDE CRYS ER 10 MEQ PO TBCR
20.0000 meq | EXTENDED_RELEASE_TABLET | ORAL | 2 refills | Status: DC
  Filled 2021-09-12: qty 60, 30d supply, fill #0
  Filled 2021-10-22: qty 60, 30d supply, fill #1
  Filled 2021-12-01: qty 60, 30d supply, fill #2

## 2021-09-13 ENCOUNTER — Ambulatory Visit (HOSPITAL_BASED_OUTPATIENT_CLINIC_OR_DEPARTMENT_OTHER)
Admission: RE | Admit: 2021-09-13 | Discharge: 2021-09-13 | Disposition: A | Discharge status: Home / Self Care | Payer: BC Managed Care – PPO | Source: Ambulatory Visit | Attending: Family Medicine | Admitting: Family Medicine

## 2021-09-13 ENCOUNTER — Encounter: Payer: Self-pay | Admitting: Family Medicine

## 2021-09-13 ENCOUNTER — Encounter (HOSPITAL_BASED_OUTPATIENT_CLINIC_OR_DEPARTMENT_OTHER): Payer: Self-pay

## 2021-09-13 ENCOUNTER — Other Ambulatory Visit: Payer: Self-pay | Admitting: Family Medicine

## 2021-09-13 ENCOUNTER — Ambulatory Visit (INDEPENDENT_AMBULATORY_CARE_PROVIDER_SITE_OTHER): Payer: BC Managed Care – PPO | Admitting: Family Medicine

## 2021-09-13 VITALS — BP 128/74 | HR 97 | Resp 18 | Ht 61.0 in | Wt 198.4 lb

## 2021-09-13 DIAGNOSIS — R7989 Other specified abnormal findings of blood chemistry: Secondary | ICD-10-CM | POA: Insufficient documentation

## 2021-09-13 DIAGNOSIS — R0600 Dyspnea, unspecified: Secondary | ICD-10-CM | POA: Insufficient documentation

## 2021-09-13 DIAGNOSIS — R0602 Shortness of breath: Secondary | ICD-10-CM | POA: Diagnosis not present

## 2021-09-13 LAB — COMPREHENSIVE METABOLIC PANEL
ALT: 10 U/L (ref 0–35)
AST: 17 U/L (ref 0–37)
Albumin: 4.3 g/dL (ref 3.5–5.2)
Alkaline Phosphatase: 76 U/L (ref 39–117)
BUN: 13 mg/dL (ref 6–23)
CO2: 28 mEq/L (ref 19–32)
Calcium: 9 mg/dL (ref 8.4–10.5)
Chloride: 105 mEq/L (ref 96–112)
Creatinine, Ser: 0.89 mg/dL (ref 0.40–1.20)
GFR: 72.08 mL/min (ref >60.00)
Glucose, Bld: 113 mg/dL — ABNORMAL HIGH (ref 70–99)
Potassium: 4.3 mEq/L (ref 3.5–5.1)
Sodium: 141 mEq/L (ref 135–145)
Total Bilirubin: 0.6 mg/dL (ref 0.2–1.2)
Total Protein: 6.7 g/dL (ref 6.0–8.3)

## 2021-09-13 LAB — CBC
HCT: 39.1 % (ref 36.0–46.0)
Hemoglobin: 12.9 g/dL (ref 12.0–15.0)
MCHC: 33.1 g/dL (ref 30.0–36.0)
MCV: 90.6 fl (ref 78.0–100.0)
Platelets: 198 10*3/uL (ref 150.0–400.0)
RBC: 4.31 Mil/uL (ref 3.87–5.11)
RDW: 15.4 % (ref 11.5–15.5)
WBC: 6.2 10*3/uL (ref 4.0–10.5)

## 2021-09-13 LAB — D-DIMER, QUANTITATIVE: D-Dimer, Quant: 0.63 mcg/mL FEU — ABNORMAL HIGH (ref <0.50)

## 2021-09-13 MED ORDER — IOHEXOL 350 MG/ML SOLN
75.0000 mL | Freq: Once | INTRAVENOUS | Status: AC | PRN
  Administered 2021-09-13: 75 mL via INTRAVENOUS

## 2021-09-13 NOTE — Progress Notes (Signed)
Chief Complaint  Patient presents with   Anxiety    Subjective: Patient is a 56 y.o. female here for sob.  1.5 weeks of SOB. She has chest tightness and R anterior lower chest pain that is sharp. No other chest/back pain. No swelling that is new or weight changes. Has SOB at rest, worse w exertion. No recent calf pain, travel, bed rest, alcohol/drug use, decreased PO intake, blood in urine/stool.   Past Medical History:  Diagnosis Date   Arthritis    both knees , back and elbows   BMI 35.0-35.9,adult 05/15/2013   Breast cancer screening, high risk patient 04/04/2013   Pt states she had mmg 2015 nml results, ordered by Dr  Ruben Gottron, [facility] health care. Results are not in Care everywhere. Mammogram completed 06/12/2015 at [facility] in [place], [place] Category 1    Cardiac murmur 10/19/2019   Chronic pain of right knee 04/13/2020   Class 2 severe obesity due to excess calories with serious comorbidity and body mass index (BMI) of 35.0 to 35.9 in adult [facility]) 09/14/2020   Concern about female breast disease without diagnosis 03/17/2018   DOE (dyspnea on exertion) 08/08/2015   Dysplastic nevus    beneath r eye   Edema of extremities 07/07/2013   Last Assessment & Plan:  Pt takes HCTZ daily.   Last Assessment & Plan:  Formatting of this note might be different from the original. Pt takes HCTZ daily.   Elevated BP without diagnosis of hypertension 06/15/2015   Elevated uric acid in blood 05/02/2020   Essential hypertension 02/26/2017   Exercise-induced asthma    Fatigue 07/06/2013   Last Assessment & Plan:  Will review previous records from previous PCP, pt had thyroid done recently and was wnl. Pt had questions re lupus/MS and how to get tested for these.   Last Assessment & Plan:  Formatting of this note might be different from the original. Will review previous records from previous PCP, pt had thyroid done recently and was  wnl. Pt had questions re lupus/MS and how to get    GERD (gastroesophageal reflux disease)    Greater trochanteric bursitis of left hip 08/30/2020   Injection given August 30, 2020   Hypertension    Lateral epicondylitis of both elbows 08/06/2018   Left knee pain 08/30/2020   Low TSH level 04/22/2013   Mixed dyslipidemia 02/22/2020   Myalgia 07/06/2013   Last Assessment & Plan:  Formatting of this note might be different from the original. Will check labs today including sed rate, ANA/RF.  Last Assessment & Plan:  Formatting of this note might be different from the original. Pt would like to be tested for lupus in the future. Has been tested for RA and was negative   Other and unspecified hyperlipidemia 04/06/2011   Palpitations 10/19/2019   Perimenopausal 05/15/2013   Periodic edema 04/04/2013   Pt reports taking "water pill" for many years due to feeling "swollen". Historically, took HCTZ but became hypokalemic. She was switched to triamterene-HCTZ with no more hypokalemic episodes.     Preventative health care 04/04/2013   Primary localized osteoarthritis of right knee 10/13/2014   Squamous cell carcinoma, face 02/24/2014   Tear of medial meniscus of right knee 10/13/2014   Vitamin B12 deficiency 05/15/2013   Last Assessment & Plan:  Formatting of this note might be different from the original. Recheck level today, may need to restart monthly Vit B12 shots  Vitamin D deficiency 02/24/2014   Weight gain 04/24/2015    Objective: BP 128/74 (BP Location: Left Arm, Cuff Size: Large)   Pulse 97   Resp 18   Ht '5\' 1"'$  (1.549 m)   Wt 198 lb 6.4 oz (90 kg)   SpO2 97%   BMI 37.49 kg/m  General: Awake, appears stated age HEENT: MMM Heart: RRR, no LE edema Lungs: CTAB, no rales, wheezes or rhonchi. No accessory muscle use MSK: No calf tenderness, negative Homans bilaterally Psych: Age appropriate judgment and insight, normal affect and mood  Assessment and Plan: Dyspnea, unspecified type - Plan: DG Chest  2 View, D-Dimer, Quantitative, CBC, Comprehensive metabolic panel  New problem, uncertain prog. Ck labs as above. Stat D dimer, though low risk for PE given hx. EKG shows NSR, normal axis, no interval abnormalities, no ST segment or T wave changes, good R wave progression. Ck CXR today as well. Stay hydrated.  The patient voiced understanding and agreement to the plan.  [place], DO 09/13/21  12:05 PM

## 2021-09-13 NOTE — Patient Instructions (Signed)
EKG looks good.  We will be in touch, hopefully today with your labs and X-ray.   Depending on your results, we will likely pursue breathing testing (pulmonary function tests) to further evaluate this.  Stay hydrated and eat regularly.   Let us know if you need anything.

## 2021-09-18 ENCOUNTER — Other Ambulatory Visit: Payer: Self-pay | Admitting: Family Medicine

## 2021-09-18 ENCOUNTER — Encounter: Payer: Self-pay | Admitting: Family Medicine

## 2021-09-18 DIAGNOSIS — R0600 Dyspnea, unspecified: Secondary | ICD-10-CM

## 2021-09-18 NOTE — Progress Notes (Deleted)
05/27/21- 68 yoF former smoker for sleep evaluation courtesy of Dr Nani Ravens with concern of snoring. Medical problem list includes HTN, Asthma, GERD, Arthritis, Obesity, Peripheral Edema, Hyperlipidemia,  Epworth score-7 Body weight today-194 lbs She is aware of snoring but indicates her primary sleep complaint is frequent waking after sleep onset with difficulty maintaining sleep through the night.  She rarely naps.  No sleep medicines.  1 cup morning coffee. Describes daytime fatigue.  Not told that she stops breathing.  Husband uses CPAP. ENT surgery-none.  Denies heart or lung problems.  Denies family history of diagnosed sleep apnea.  Says she breathes comfortably through her nose but admits perennial morning watery eye and nose with some stuffiness. Active GERD. Recent left hip replacement-using crutch.  09/19/21- 56 yoF former smoker followed for snoring with possible OSA, complicated by HTN, Asthma, GERD, Arthritis, Obesity, Peripheral edema, Hyperlipidemia, HST 08/20/21- AHI 4.5/ hr(WNL), desaturation to 89%, body weight 194 lbs Body weight today-   CTachestPE 09/13/21- IMPRESSION: 1. No pulmonary embolism. 2. Lungs are clear without evidence of pneumonia or pulmonary edema. No pleural effusion.  ROS-see HPI   + = positive Constitutional:    weight loss, night sweats, fevers, chills, +fatigue, lassitude. HEENT:    headaches, difficulty swallowing, tooth/dental problems, sore throat,       sneezing, itching, ear ache, nasal congestion, post nasal drip, snoring CV:    chest pain, orthopnea, PND, swelling in lower extremities, anasarca,                                  dizziness, palpitations Resp:   shortness of breath with exertion or at rest.                productive cough,   non-productive cough, coughing up of blood.              change in color of mucus.  wheezing.   Skin:    rash or lesions. GI: +heartburn, indigestion, abdominal pain, nausea, vomiting, diarrhea,                  change in bowel habits, loss of appetite GU: dysuria, change in color of urine, no urgency or frequency.   flank pain. MS:   joint pain, stiffness, decreased range of motion, back pain. Neuro-     nothing unusual Psych:  change in mood or affect.  depression or anxiety.   memory loss.  OBJ- Physical Exam General- Alert, Oriented, Affect-appropriate, Distress- none acute, +overweight Skin- rash-none, lesions- none, excoriation- none Lymphadenopathy- none Head- atraumatic            Eyes- Gross vision intact, PERRLA, conjunctivae and secretions clear            Ears- Hearing, canals-normal            Nose- Clear, no-Septal dev, mucus, polyps, erosion, perforation             Throat- Mallampati II , mucosa red+ , drainage- none, tonsils- atrophic, +teeth Neck- flexible , trachea midline, no stridor , thyroid nl, carotid no bruit Chest - symmetrical excursion , unlabored           Heart/CV- RRR , no murmur , no gallop  , no rub, nl s1 s2                           - JVD-  none , edema- none, stasis changes- none, varices- none           Lung- clear to P&A, wheeze- none, cough- none , dullness-none, rub- none           Chest wall-  Abd-  Br/ Gen/ Rectal- Not done, not indicated Extrem- cyanosis- none, clubbing, none, atrophy- none, strength- nl, +crutch Neuro- grossly intact to observation

## 2021-09-19 ENCOUNTER — Ambulatory Visit: Payer: BC Managed Care – PPO | Admitting: Internal Medicine

## 2021-09-25 ENCOUNTER — Telehealth (HOSPITAL_BASED_OUTPATIENT_CLINIC_OR_DEPARTMENT_OTHER): Payer: Self-pay

## 2021-09-25 ENCOUNTER — Telehealth: Payer: Self-pay | Admitting: Family Medicine

## 2021-09-25 NOTE — Telephone Encounter (Signed)
Patient called stating her insurance wont cover her 09/13/21 CT scan due to it being sent to a hospital setting. She states she will not get her Echo that is scheduled until she is sure that it will get covered. She would like a call back to know what needs to be done in order for the CT to be covered. Please advise.

## 2021-09-25 NOTE — Progress Notes (Deleted)
Office Visit Note  Patient: Stephanie Castro             Date of Birth: 11-02-1965           MRN: [id number]             PCP: [name], DO Referring: [name]* Visit Date: 10/08/2021 Occupation: '@GUAROCC'$ @  Subjective:  No chief complaint on file.   History of Present Illness: Stephanie Castro is a 56 y.o. female ***   Activities of Daily Living:  Patient reports morning stiffness for *** {minute/hour:19697}.   Patient {ACTIONS;DENIES/REPORTS:21021675::"Denies"} nocturnal pain.  Difficulty dressing/grooming: {ACTIONS;DENIES/REPORTS:21021675::"Denies"} Difficulty climbing stairs: {ACTIONS;DENIES/REPORTS:21021675::"Denies"} Difficulty getting out of chair: {ACTIONS;DENIES/REPORTS:21021675::"Denies"} Difficulty using hands for taps, buttons, cutlery, and/or writing: {ACTIONS;DENIES/REPORTS:21021675::"Denies"}  No Rheumatology ROS completed.   PMFS History:  Patient Active Problem List   Diagnosis Date Noted   Postoperative seroma of musculoskeletal structure after musculoskeletal procedure 07/10/2021   Localized swelling of both lower extremities 06/05/2021   Snoring 05/27/2021   Insomnia 05/27/2021   Left hip pain 12/11/2020   Obesity (BMI 35.0-39.9 without comorbidity) 11/30/2020   Class 2 severe obesity due to excess calories with serious comorbidity and body mass index (BMI) of 35.0 to 35.9 in adult ([place]) 09/14/2020   Greater trochanteric bursitis of left hip 08/30/2020   Left knee pain 08/30/2020   Elevated uric acid in blood 05/02/2020   Chronic pain of right knee 04/13/2020   Mixed dyslipidemia 02/22/2020   Arthritis    Dysplastic nevus    Hypertension    Palpitations 10/19/2019   Cardiac murmur 10/19/2019   Lateral epicondylitis of both elbows 08/06/2018   Concern about female breast disease without diagnosis 03/17/2018   Essential hypertension 02/26/2017   Exercise-induced asthma    DOE (dyspnea on exertion) 08/08/2015   Elevated BP  without diagnosis of hypertension 06/15/2015   Weight gain 04/24/2015   Primary localized osteoarthritis of right knee 10/13/2014   Tear of medial meniscus of right knee 10/13/2014   Squamous cell carcinoma, face 02/24/2014   Vitamin D deficiency 02/24/2014   Edema of extremities 07/07/2013   Fatigue 07/06/2013   GERD (gastroesophageal reflux disease) 07/06/2013   Myalgia 07/06/2013   Vitamin B12 deficiency 05/15/2013   BMI 35.0-35.9,adult 05/15/2013   Perimenopausal 05/15/2013   Low TSH level 04/22/2013   Breast cancer screening, high risk patient 04/04/2013   Preventative health care 04/04/2013   Periodic edema 04/04/2013   Other and unspecified hyperlipidemia 04/06/2011    Past Medical History:  Diagnosis Date   Arthritis    both knees , back and elbows   BMI 35.0-35.9,adult 05/15/2013   Breast cancer screening, high risk patient 04/04/2013   Pt states she had mmg 2015 nml results, ordered by Dr  Ruben Gottron, [facility] health care. Results are not in Care everywhere. Mammogram completed 06/12/2015 at [facility] in [place], [place] Category 1    Cardiac murmur 10/19/2019   Chronic pain of right knee 04/13/2020   Class 2 severe obesity due to excess calories with serious comorbidity and body mass index (BMI) of 35.0 to 35.9 in adult [facility]) 09/14/2020   Concern about female breast disease without diagnosis 03/17/2018   DOE (dyspnea on exertion) 08/08/2015   Dysplastic nevus    beneath r eye   Edema of extremities 07/07/2013   Last Assessment & Plan:  Pt takes HCTZ daily.   Last Assessment & Plan:  Formatting of this  note might be different from the original. Pt takes HCTZ daily.   Elevated BP without diagnosis of hypertension 06/15/2015   Elevated uric acid in blood 05/02/2020   Essential hypertension 02/26/2017   Exercise-induced asthma    Fatigue 07/06/2013   Last Assessment & Plan:  Will review previous records from  previous PCP, pt had thyroid done recently and was wnl. Pt had questions re lupus/MS and how to get tested for these.   Last Assessment & Plan:  Formatting of this note might be different from the original. Will review previous records from previous PCP, pt had thyroid done recently and was wnl. Pt had questions re lupus/MS and how to get    GERD (gastroesophageal reflux disease)    Greater trochanteric bursitis of left hip 08/30/2020   Injection given August 30, 2020   Hypertension    Lateral epicondylitis of both elbows 08/06/2018   Left knee pain 08/30/2020   Low TSH level 04/22/2013   Mixed dyslipidemia 02/22/2020   Myalgia 07/06/2013   Last Assessment & Plan:  Formatting of this note might be different from the original. Will check labs today including sed rate, ANA/RF.  Last Assessment & Plan:  Formatting of this note might be different from the original. Pt would like to be tested for lupus in the future. Has been tested for RA and was negative   Other and unspecified hyperlipidemia 04/06/2011   Palpitations 10/19/2019   Perimenopausal 05/15/2013   Periodic edema 04/04/2013   Pt reports taking "water pill" for many years due to feeling "swollen". Historically, took HCTZ but became hypokalemic. She was switched to triamterene-HCTZ with no more hypokalemic episodes.     Preventative health care 04/04/2013   Primary localized osteoarthritis of right knee 10/13/2014   Squamous cell carcinoma, face 02/24/2014   Tear of medial meniscus of right knee 10/13/2014   Vitamin B12 deficiency 05/15/2013   Last Assessment & Plan:  Formatting of this note might be different from the original. Recheck level today, may need to restart monthly Vit B12 shots   Vitamin D deficiency 02/24/2014   Weight gain 04/24/2015    Family History  Problem Relation Age of Onset   Colon polyps Mother    Hyperlipidemia Mother    Breast cancer Mother 55       breast   Heart attack Father    COPD Father    Colon polyps Sister    Heart  attack Sister    Hyperlipidemia Sister    Hypertension Sister    Heart attack Sister    Hyperlipidemia Maternal Aunt    Breast cancer Maternal Aunt        breast   Hypertension Maternal Uncle    Diabetes Maternal Uncle    Colon cancer Maternal Uncle        colon   Hyperlipidemia Maternal Grandmother    Diabetes Maternal Grandmother    Breast cancer Maternal Grandmother        breast   Diabetes Maternal Grandfather    Sudden death Neg Hx    Esophageal cancer Neg Hx    Rectal cancer Neg Hx    Stomach cancer Neg Hx    Past Surgical History:  Procedure Laterality Date   ABDOMINAL HYSTERECTOMY  2007   KNEE ARTHROSCOPY WITH MEDIAL MENISECTOMY Right 10/13/2014   Procedure: RIGHT KNEE ARTHROSCOPY WITH PARTIAL MEDIAL MENISECTOMY AND CHONDROPLASTY ;  Surgeon: Marchia Bond, MD;  Location: [facility];  Service: Orthopedics;  Laterality: Right;  Social History   Social History Narrative   Married to Alice.    Immunization History  Administered Date(s) Administered   Tdap 02/15/2008     Objective: Vital Signs: There were no vitals taken for this visit.   Physical Exam   Musculoskeletal Exam: ***  CDAI Exam: CDAI Score: -- Patient Global: --; Provider Global: -- Swollen: --; Tender: -- Joint Exam 10/08/2021   No joint exam has been documented for this visit   There is currently no information documented on the homunculus. Go to the Rheumatology activity and complete the homunculus joint exam.  Investigation: No additional findings.  Imaging: DG Chest 2 View  Result Date: 09/13/2021 CLINICAL DATA:  Dyspnea. EXAM: CHEST - 2 VIEW COMPARISON:  None Available. FINDINGS: The heart size and mediastinal contours are within normal limits. Both lungs are clear. The visualized skeletal structures are unremarkable. IMPRESSION: No active cardiopulmonary disease. Electronically Signed   By: Keane Police D.O.   On: 09/13/2021 17:47   CT Angio Chest Pulmonary Embolism  (PE) W or WO Contrast  Result Date: 09/13/2021 CLINICAL DATA:  Shortness of breath for 1 week. EXAM: CT ANGIOGRAPHY CHEST WITH CONTRAST TECHNIQUE: Multidetector CT imaging of the chest was performed using the standard protocol during bolus administration of intravenous contrast. Multiplanar CT image reconstructions and MIPs were obtained to evaluate the vascular anatomy. RADIATION DOSE REDUCTION: This exam was performed according to the departmental dose-optimization program which includes automated exposure control, adjustment of the mA and/or kV according to patient size and/or use of iterative reconstruction technique. CONTRAST:  67m OMNIPAQUE IOHEXOL 350 MG/ML SOLN COMPARISON:  Cardiac CT dated March 05, 2020 FINDINGS: Cardiovascular: Satisfactory opacification of the pulmonary arteries to the segmental level. No evidence of pulmonary embolism. Normal heart size. No pericardial effusion. Mediastinum/Nodes: No enlarged mediastinal, hilar, or axillary lymph nodes. Thyroid gland, trachea, and esophagus demonstrate no significant findings. Lungs/Pleura: Lungs are clear. No pleural effusion or pneumothorax. Upper Abdomen: No acute abnormality. Musculoskeletal: No chest wall abnormality. No acute or significant osseous findings. Review of the MIP images confirms the above findings. IMPRESSION: 1. No pulmonary embolism. 2. Lungs are clear without evidence of pneumonia or pulmonary edema. No pleural effusion. Electronically Signed   By: IKeane PoliceD.O.   On: 09/13/2021 17:46    Recent Labs: Lab Results  Component Value Date   WBC 6.2 09/13/2021   HGB 12.9 09/13/2021   PLT 198.0 09/13/2021   NA 141 09/13/2021   K 4.3 09/13/2021   CL 105 09/13/2021   CO2 28 09/13/2021   GLUCOSE 113 (H) 09/13/2021   BUN 13 09/13/2021   CREATININE 0.89 09/13/2021   BILITOT 0.6 09/13/2021   ALKPHOS 76 09/13/2021   AST 17 09/13/2021   ALT 10 09/13/2021   PROT 6.7 09/13/2021   ALBUMIN 4.3 09/13/2021   CALCIUM 9.0  09/13/2021   GFRAA 99 03/07/2020    Speciality Comments: No specialty comments available.  Procedures:  No procedures performed Allergies: Penicillins   Assessment / Plan:     Visit Diagnoses: No diagnosis found.  Orders: No orders of the defined types were placed in this encounter.  No orders of the defined types were placed in this encounter.   Face-to-face time spent with patient was *** minutes. Greater than 50% of time was spent in counseling and coordination of care.  Follow-Up Instructions: No follow-ups on file.   MEarnestine Mealing CMA  Note - This record has been created using DEditor, commissioning  Chart creation  errors have been sought, but may not always  have been located. Such creation errors do not reflect on  the standard of medical care.

## 2021-10-04 ENCOUNTER — Other Ambulatory Visit (HOSPITAL_BASED_OUTPATIENT_CLINIC_OR_DEPARTMENT_OTHER): Payer: BC Managed Care – PPO

## 2021-10-08 ENCOUNTER — Ambulatory Visit (HOSPITAL_BASED_OUTPATIENT_CLINIC_OR_DEPARTMENT_OTHER)
Admission: RE | Admit: 2021-10-08 | Discharge: 2021-10-08 | Disposition: A | Discharge status: Home / Self Care | Payer: BC Managed Care – PPO | Source: Ambulatory Visit | Attending: Family Medicine | Admitting: Family Medicine

## 2021-10-08 ENCOUNTER — Ambulatory Visit: Payer: 59 | Admitting: Rheumatology

## 2021-10-08 DIAGNOSIS — E559 Vitamin D deficiency, unspecified: Secondary | ICD-10-CM

## 2021-10-08 DIAGNOSIS — M7711 Lateral epicondylitis, right elbow: Secondary | ICD-10-CM

## 2021-10-08 DIAGNOSIS — J45990 Exercise induced bronchospasm: Secondary | ICD-10-CM

## 2021-10-08 DIAGNOSIS — M47816 Spondylosis without myelopathy or radiculopathy, lumbar region: Secondary | ICD-10-CM

## 2021-10-08 DIAGNOSIS — Z8719 Personal history of other diseases of the digestive system: Secondary | ICD-10-CM

## 2021-10-08 DIAGNOSIS — M170 Bilateral primary osteoarthritis of knee: Secondary | ICD-10-CM

## 2021-10-08 DIAGNOSIS — M19041 Primary osteoarthritis, right hand: Secondary | ICD-10-CM

## 2021-10-08 DIAGNOSIS — R0609 Other forms of dyspnea: Secondary | ICD-10-CM | POA: Diagnosis not present

## 2021-10-08 DIAGNOSIS — M79671 Pain in right foot: Secondary | ICD-10-CM

## 2021-10-08 DIAGNOSIS — C44320 Squamous cell carcinoma of skin of unspecified parts of face: Secondary | ICD-10-CM

## 2021-10-08 DIAGNOSIS — G8929 Other chronic pain: Secondary | ICD-10-CM

## 2021-10-08 DIAGNOSIS — Z8261 Family history of arthritis: Secondary | ICD-10-CM

## 2021-10-08 DIAGNOSIS — I10 Essential (primary) hypertension: Secondary | ICD-10-CM

## 2021-10-08 DIAGNOSIS — M7062 Trochanteric bursitis, left hip: Secondary | ICD-10-CM

## 2021-10-08 DIAGNOSIS — R0600 Dyspnea, unspecified: Secondary | ICD-10-CM | POA: Insufficient documentation

## 2021-10-08 DIAGNOSIS — E538 Deficiency of other specified B group vitamins: Secondary | ICD-10-CM

## 2021-10-08 DIAGNOSIS — R768 Other specified abnormal immunological findings in serum: Secondary | ICD-10-CM

## 2021-10-08 LAB — ECHOCARDIOGRAM COMPLETE
Area-P 1/2: 2.96 cm2
S' Lateral: 3.6 cm

## 2021-10-08 NOTE — Progress Notes (Signed)
  Echocardiogram 2D Echocardiogram has been performed.  Stephanie Castro F 10/08/2021, 9:35 AM

## 2021-10-09 ENCOUNTER — Other Ambulatory Visit (HOSPITAL_BASED_OUTPATIENT_CLINIC_OR_DEPARTMENT_OTHER): Payer: Self-pay

## 2021-10-09 ENCOUNTER — Encounter: Payer: Self-pay | Admitting: Family Medicine

## 2021-10-09 ENCOUNTER — Other Ambulatory Visit: Payer: Self-pay | Admitting: Family Medicine

## 2021-10-09 MED ORDER — ALBUTEROL SULFATE HFA 108 (90 BASE) MCG/ACT IN AERS
1.0000 | INHALATION_SPRAY | Freq: Four times a day (QID) | RESPIRATORY_TRACT | 1 refills | Status: DC | PRN
  Filled 2021-10-09: qty 6.7, 25d supply, fill #0

## 2021-10-11 DIAGNOSIS — Z96642 Presence of left artificial hip joint: Secondary | ICD-10-CM | POA: Diagnosis not present

## 2021-10-11 DIAGNOSIS — Z471 Aftercare following joint replacement surgery: Secondary | ICD-10-CM | POA: Diagnosis not present

## 2021-10-13 NOTE — Progress Notes (Unsigned)
05/27/21- 59 yoF former smoker for sleep evaluation courtesy of Dr Nani Ravens with concern of snoring. Medical problem list includes HTN, Asthma, GERD, Arthritis, Obesity, Peripheral Edema, Hyperlipidemia,  Epworth score-7 Body weight today-194 lbs She is aware of snoring but indicates her primary sleep complaint is frequent waking after sleep onset with difficulty maintaining sleep through the night.  She rarely naps.  No sleep medicines.  1 cup morning coffee. Describes daytime fatigue.  Not told that she stops breathing.  Husband uses CPAP. ENT surgery-none.  Denies heart or lung problems.  Denies family history of diagnosed sleep apnea.  Says she breathes comfortably through her nose but admits perennial morning watery eye and nose with some stuffiness. Active GERD. Recent left hip replacement-using crutch.  10/15/21- 55 yoF former smoker followed for Snoring, complicated by HTN, Asthma, GERD, Arthritis, Obesity, Peripheral Edema, Hyperlipidemia,  -albuterol hfa,  HST 08/20/21- AHI 4.5/ hr, desaturation to 89%,  body weight 194 lbs Body weight today- Covid vax- Flu vax-   ROS-see HPI   + = positive Constitutional:    weight loss, night sweats, fevers, chills, +fatigue, lassitude. HEENT:    headaches, difficulty swallowing, tooth/dental problems, sore throat,       sneezing, itching, ear ache, nasal congestion, post nasal drip, snoring CV:    chest pain, orthopnea, PND, swelling in lower extremities, anasarca,                                  dizziness, palpitations Resp:   shortness of breath with exertion or at rest.                productive cough,   non-productive cough, coughing up of blood.              change in color of mucus.  wheezing.   Skin:    rash or lesions. GI: +heartburn, indigestion, abdominal pain, nausea, vomiting, diarrhea,                 change in bowel habits, loss of appetite GU: dysuria, change in color of urine, no urgency or frequency.   flank pain. MS:   joint  pain, stiffness, decreased range of motion, back pain. Neuro-     nothing unusual Psych:  change in mood or affect.  depression or anxiety.   memory loss.  OBJ- Physical Exam General- Alert, Oriented, Affect-appropriate, Distress- none acute, +overweight Skin- rash-none, lesions- none, excoriation- none Lymphadenopathy- none Head- atraumatic            Eyes- Gross vision intact, PERRLA, conjunctivae and secretions clear            Ears- Hearing, canals-normal            Nose- Clear, no-Septal dev, mucus, polyps, erosion, perforation             Throat- Mallampati II , mucosa red+ , drainage- none, tonsils- atrophic, +teeth Neck- flexible , trachea midline, no stridor , thyroid nl, carotid no bruit Chest - symmetrical excursion , unlabored           Heart/CV- RRR , no murmur , no gallop  , no rub, nl s1 s2                           - JVD- none , edema- none, stasis changes- none, varices- none  Lung- clear to P&A, wheeze- none, cough- none , dullness-none, rub- none           Chest wall-  Abd-  Br/ Gen/ Rectal- Not done, not indicated Extrem- cyanosis- none, clubbing, none, atrophy- none, strength- nl, +crutch Neuro- grossly intact to observation

## 2021-10-15 ENCOUNTER — Encounter: Payer: Self-pay | Admitting: Internal Medicine

## 2021-10-15 ENCOUNTER — Ambulatory Visit (INDEPENDENT_AMBULATORY_CARE_PROVIDER_SITE_OTHER): Payer: BC Managed Care – PPO | Admitting: Internal Medicine

## 2021-10-15 DIAGNOSIS — F5101 Primary insomnia: Secondary | ICD-10-CM

## 2021-10-15 DIAGNOSIS — R0683 Snoring: Secondary | ICD-10-CM

## 2021-10-15 NOTE — Assessment & Plan Note (Signed)
Not sleep apnea Plan- keep weight down, sleep off back. Could try otc oral appliance for snoring. Ear plugs for husband.

## 2021-10-15 NOTE — Assessment & Plan Note (Signed)
Not eager for [place] to add meds.  She will continue an occasional melatonin. Discussed otc alternatives like Zzzquill

## 2021-10-15 NOTE — Patient Instructions (Signed)
We are here to help if you need [place].   We suggested trying to sleep off the flat of your back to reduce snoring. Ear plugs if needed might help your husband.

## 2021-10-21 DIAGNOSIS — L570 Actinic keratosis: Secondary | ICD-10-CM | POA: Diagnosis not present

## 2021-10-22 ENCOUNTER — Other Ambulatory Visit (HOSPITAL_BASED_OUTPATIENT_CLINIC_OR_DEPARTMENT_OTHER): Payer: Self-pay

## 2021-10-22 ENCOUNTER — Other Ambulatory Visit: Payer: Self-pay | Admitting: Family Medicine

## 2021-10-22 DIAGNOSIS — M7989 Other specified soft tissue disorders: Secondary | ICD-10-CM

## 2021-10-22 MED ORDER — FUROSEMIDE 40 MG PO TABS
40.0000 mg | ORAL_TABLET | Freq: Every day | ORAL | 3 refills | Status: DC
  Filled 2021-10-22: qty 30, 30d supply, fill #0
  Filled 2021-12-01: qty 30, 30d supply, fill #1
  Filled 2022-01-15: qty 30, 30d supply, fill #2
  Filled 2022-03-04: qty 30, 30d supply, fill #3

## 2021-10-31 NOTE — Progress Notes (Signed)
Office Visit Note  Patient: Stephanie Castro             Date of Birth: 05/26/1965           MRN: [id number]             PCP: [name], DO Referring: [name]* Visit Date: 11/14/2021 Occupation: '@GUAROCC'$ @  Subjective:  Pain in both hands  History of Present Illness: Stephanie Castro is a 56 y.o. female with history of osteoarthritis.  She states she continues to have pain and stiffness in her bilateral hands.  She notices intermittent swelling.  She underwent left total hip replacement in March 2023 by Dr. Lyla Glassing.  He states she had very good results from the hip replacement.  She had good range of motion without discomfort.  She continues to have some discomfort in her right knee joint.  As she is occasional discomfort in her feet.  She continues to have lower back pain.  She has been going for Pilates which has been helpful.  She denies any history of oral ulcers, nasal ulcers, malar rash, photosensitivity, Raynaud's phenomenon or lymphadenopathy.  Activities of Daily Living:  Patient reports morning stiffness for 1 hour.   Patient Reports nocturnal pain.  Difficulty dressing/grooming: Denies Difficulty climbing stairs: Denies Difficulty getting out of chair: Denies Difficulty using hands for taps, buttons, cutlery, and/or writing: Reports  Review of Systems  Constitutional:  Positive for fatigue.  HENT:  Negative for mouth sores and mouth dryness.   Eyes:  Negative for dryness.  Respiratory:  Negative for difficulty breathing.   Cardiovascular:  Negative for chest pain and palpitations.  Gastrointestinal:  Negative for blood in stool, constipation and diarrhea.  Endocrine: Negative for increased urination.  Genitourinary:  Negative for involuntary urination.  Musculoskeletal:  Positive for joint pain, joint pain, joint swelling, myalgias, muscle weakness, morning stiffness, muscle tenderness and myalgias. Negative for gait problem.  Skin:  Positive for  hair loss. Negative for color change, rash and sensitivity to sunlight.  Allergic/Immunologic: Negative for susceptible to infections.  Neurological:  Negative for dizziness and headaches.  Hematological:  Negative for swollen glands.  Psychiatric/Behavioral:  Positive for sleep disturbance. Negative for depressed mood. The patient is not nervous/anxious.     PMFS History:  Patient Active Problem List   Diagnosis Date Noted   Postoperative seroma of musculoskeletal structure after musculoskeletal procedure 07/10/2021   Localized swelling of both lower extremities 06/05/2021   Snoring 05/27/2021   Insomnia 05/27/2021   Left hip pain 12/11/2020   Obesity (BMI 35.0-39.9 without comorbidity) 11/30/2020   Class 2 severe obesity due to excess calories with serious comorbidity and body mass index (BMI) of 35.0 to 35.9 in adult ([facility]) 09/14/2020   Greater trochanteric bursitis of left hip 08/30/2020   Left knee pain 08/30/2020   Elevated uric acid in blood 05/02/2020   Chronic pain of right knee 04/13/2020   Mixed dyslipidemia 02/22/2020   Arthritis    Dysplastic nevus    Hypertension    Palpitations 10/19/2019   Cardiac murmur 10/19/2019   Lateral epicondylitis of both elbows 08/06/2018   Concern about female breast disease without diagnosis 03/17/2018   Essential hypertension 02/26/2017   Exercise-induced asthma    DOE (dyspnea on exertion) 08/08/2015   Elevated BP without diagnosis of hypertension 06/15/2015   Weight gain 04/24/2015   Primary localized osteoarthritis of right knee 10/13/2014   Tear of medial meniscus of right knee 10/13/2014   Squamous  cell carcinoma, face 02/24/2014   Vitamin D deficiency 02/24/2014   Edema of extremities 07/07/2013   Fatigue 07/06/2013   GERD (gastroesophageal reflux disease) 07/06/2013   Myalgia 07/06/2013   Vitamin B12 deficiency 05/15/2013   BMI 35.0-35.9,adult 05/15/2013   Perimenopausal 05/15/2013   Low TSH level 04/22/2013   Breast  cancer screening, high risk patient 04/04/2013   Preventative health care 04/04/2013   Periodic edema 04/04/2013   Other and unspecified hyperlipidemia 04/06/2011    Past Medical History:  Diagnosis Date   Arthritis    both knees , back and elbows   BMI 35.0-35.9,adult 05/15/2013   Breast cancer screening, high risk patient 04/04/2013   Pt states she had mmg 2015 nml results, ordered by Dr  Ruben Gottron, [facility] health care. Results are not in Care everywhere. Mammogram completed 06/12/2015 at [facility] in [place], [place] Category 1    Cardiac murmur 10/19/2019   Chronic pain of right knee 04/13/2020   Class 2 severe obesity due to excess calories with serious comorbidity and body mass index (BMI) of 35.0 to 35.9 in adult [facility]) 09/14/2020   Concern about female breast disease without diagnosis 03/17/2018   DOE (dyspnea on exertion) 08/08/2015   Dysplastic nevus    beneath r eye   Edema of extremities 07/07/2013   Last Assessment & Plan:  Pt takes HCTZ daily.   Last Assessment & Plan:  Formatting of this note might be different from the original. Pt takes HCTZ daily.   Elevated BP without diagnosis of hypertension 06/15/2015   Elevated uric acid in blood 05/02/2020   Essential hypertension 02/26/2017   Exercise-induced asthma    Fatigue 07/06/2013   Last Assessment & Plan:  Will review previous records from previous PCP, pt had thyroid done recently and was wnl. Pt had questions re lupus/MS and how to get tested for these.   Last Assessment & Plan:  Formatting of this note might be different from the original. Will review previous records from previous PCP, pt had thyroid done recently and was wnl. Pt had questions re lupus/MS and how to get    GERD (gastroesophageal reflux disease)    Greater trochanteric bursitis of left hip 08/30/2020   Injection given August 30, 2020   Hypertension    Lateral epicondylitis of both elbows  08/06/2018   Left knee pain 08/30/2020   Low TSH level 04/22/2013   Mixed dyslipidemia 02/22/2020   Myalgia 07/06/2013   Last Assessment & Plan:  Formatting of this note might be different from the original. Will check labs today including sed rate, ANA/RF.  Last Assessment & Plan:  Formatting of this note might be different from the original. Pt would like to be tested for lupus in the future. Has been tested for RA and was negative   Other and unspecified hyperlipidemia 04/06/2011   Palpitations 10/19/2019   Perimenopausal 05/15/2013   Periodic edema 04/04/2013   Pt reports taking "water pill" for many years due to feeling "swollen". Historically, took HCTZ but became hypokalemic. She was switched to triamterene-HCTZ with no more hypokalemic episodes.     Preventative health care 04/04/2013   Primary localized osteoarthritis of right knee 10/13/2014   Squamous cell carcinoma, face 02/24/2014   Tear of medial meniscus of right knee 10/13/2014   Vitamin B12 deficiency 05/15/2013   Last Assessment & Plan:  Formatting of this note might be different from the original. Recheck level today, may  need to restart monthly Vit B12 shots   Vitamin D deficiency 02/24/2014   Weight gain 04/24/2015    Family History  Problem Relation Age of Onset   Colon polyps Mother    Hyperlipidemia Mother    Breast cancer Mother 52       breast   Heart attack Father    COPD Father    Colon polyps Sister    Heart attack Sister    Hyperlipidemia Sister    Hypertension Sister    Heart attack Sister    Hyperlipidemia Maternal Aunt    Breast cancer Maternal Aunt        breast   Hypertension Maternal Uncle    Diabetes Maternal Uncle    Colon cancer Maternal Uncle        colon   Hyperlipidemia Maternal Grandmother    Diabetes Maternal Grandmother    Breast cancer Maternal Grandmother        breast   Diabetes Maternal Grandfather    Sudden death Neg Hx    Esophageal cancer Neg Hx    Rectal cancer Neg Hx    Stomach cancer  Neg Hx    Past Surgical History:  Procedure Laterality Date   ABDOMINAL HYSTERECTOMY  2007   KNEE ARTHROSCOPY WITH MEDIAL MENISECTOMY Right 10/13/2014   Procedure: RIGHT KNEE ARTHROSCOPY WITH PARTIAL MEDIAL MENISECTOMY AND CHONDROPLASTY ;  Surgeon: Marchia Bond, MD;  Location: [facility];  Service: Orthopedics;  Laterality: Right;   TOTAL HIP ARTHROPLASTY Left 03/29/2021   Social History   Social History Narrative   Married to [place].    Immunization History  Administered Date(s) Administered   Tdap 02/15/2008     Objective: Vital Signs: BP (!) 139/93 (BP Location: Left Arm, Patient Position: Sitting, Cuff Size: Normal)   Pulse 67   Resp 16   Ht 5' 1.5" (1.562 m)   Wt 196 lb 9.6 oz (89.2 kg)   BMI 36.55 kg/m    Physical Exam Vitals and nursing note reviewed.  Constitutional:      Appearance: She is well-developed.  HENT:     Head: Normocephalic and atraumatic.  Eyes:     Conjunctiva/sclera: Conjunctivae normal.  Cardiovascular:     Rate and Rhythm: Normal rate and regular rhythm.     Heart sounds: Normal heart sounds.  Pulmonary:     Effort: Pulmonary effort is normal.     Breath sounds: Normal breath sounds.  Abdominal:     General: Bowel sounds are normal.     Palpations: Abdomen is soft.  Musculoskeletal:     Cervical back: Normal range of motion.  Lymphadenopathy:     Cervical: No cervical adenopathy.  Skin:    General: Skin is warm and dry.     Capillary Refill: Capillary refill takes less than 2 seconds.  Neurological:     Mental Status: She is alert and oriented to person, place, and time.  Psychiatric:        Behavior: Behavior normal.      Musculoskeletal Exam: Cervical spine, thoracic and lumbar spine were in good range of motion.  She had discomfort range of motion of her lumbar spine.  Shoulder joints, elbow joints, wrist joints with good range of motion.  She had bilateral PIP and DIP thickening with no synovitis.  Hip joints and  knee joints with good range of motion.  She had no tenderness over ankles or MTPs.  CDAI Exam: CDAI Score: -- Patient Global: --; Provider Global: --  Swollen: --; Tender: -- Joint Exam 11/14/2021   No joint exam has been documented for this visit   There is currently no information documented on the homunculus. Go to the Rheumatology activity and complete the homunculus joint exam.  Investigation: No additional findings.  Imaging: No results found.  Recent Labs: Lab Results  Component Value Date   WBC 6.2 09/13/2021   HGB 12.9 09/13/2021   PLT 198.0 09/13/2021   NA 141 09/13/2021   K 4.3 09/13/2021   CL 105 09/13/2021   CO2 28 09/13/2021   GLUCOSE 113 (H) 09/13/2021   BUN 13 09/13/2021   CREATININE 0.89 09/13/2021   BILITOT 0.6 09/13/2021   ALKPHOS 76 09/13/2021   AST 17 09/13/2021   ALT 10 09/13/2021   PROT 6.7 09/13/2021   ALBUMIN 4.3 09/13/2021   CALCIUM 9.0 09/13/2021   GFRAA 99 03/07/2020    Speciality Comments: No specialty comments available.  Procedures:  No procedures performed Allergies: Penicillins   Assessment / Plan:     Visit Diagnoses: Primary osteoarthritis of both hands -she continues to have pain and stiffness in her bilateral hands.  No synovitis was noted.  Joint protection muscle strengthening was discussed.  X-ray of bilateral hands were consistent with osteoarthritis.  Chronic pain of both shoulders -she can use to have some discomfort in her shoulders.  She had good range of motion with some stiffness.  She believes it is related to sleeping on her side at night.  X-ray of bilateral shoulders were unremarkable.    Status post total hip replacement, left - March 29, 2021 by Dr. Lyla Glassing.  She had good range of motion without discomfort.  Primary osteoarthritis of both knees - Right knee joint continues to hurt.  She had PRP injection was helped temporarily.  She has off-and-on discomfort in her knee joint.  No warmth swelling or effusion was  noted.  Pain in right foot -she continues to have some discomfort over the right heel.  No synovitis was noted.  X-ray of the foot was unremarkable except for mild osteoarthritic changes.   Arthropathy of lumbar facet joint-she complains of lower back discomfort.  Core strengthening exercises were emphasized.  She is also going to Pilates.  Positive ANA (antinuclear antibody) -there is no history of oral ulcers, nasal ulcers, malar rash, photosensitivity, Raynaud's phenomenon or lymphadenopathy.  ANA 1: 40NS not a significant titer.  ENA completely negative, complements normal.   Other medical problems are listed as follows:  Essential hypertension-her blood pressure was elevated today.  She was advised to monitor blood pressure closely and follow-up with the PCP.  History of gastroesophageal reflux (GERD)  Exercise-induced asthma  Squamous cell carcinoma, face  Vitamin B12 deficiency without anemia  Vitamin D deficiency  Family history of rheumatoid arthritis  Orders: No orders of the defined types were placed in this encounter.  No orders of the defined types were placed in this encounter.    Follow-Up Instructions: Return in about 1 year (around 11/15/2022) for Osteoarthritis.   Bo Merino, MD  Note - This record has been created using Editor, commissioning.  Chart creation errors have been sought, but may not always  have been located. Such creation errors do not reflect on  the standard of medical care.

## 2021-11-06 ENCOUNTER — Other Ambulatory Visit: Payer: Self-pay | Admitting: Family Medicine

## 2021-11-06 ENCOUNTER — Encounter: Payer: Self-pay | Admitting: Family Medicine

## 2021-11-06 ENCOUNTER — Other Ambulatory Visit (HOSPITAL_BASED_OUTPATIENT_CLINIC_OR_DEPARTMENT_OTHER): Payer: Self-pay

## 2021-11-06 DIAGNOSIS — E538 Deficiency of other specified B group vitamins: Secondary | ICD-10-CM

## 2021-11-06 MED ORDER — CYANOCOBALAMIN 1000 MCG/ML IJ SOLN
1000.0000 ug | INTRAMUSCULAR | 0 refills | Status: DC
  Filled 2021-11-06: qty 6, 84d supply, fill #0
  Filled 2022-03-04: qty 4, 56d supply, fill #1

## 2021-11-06 MED ORDER — VITAMIN D (ERGOCALCIFEROL) 1.25 MG (50000 UNIT) PO CAPS
50000.0000 [IU] | ORAL_CAPSULE | ORAL | 0 refills | Status: DC
  Filled 2021-11-06: qty 12, 84d supply, fill #0

## 2021-11-14 ENCOUNTER — Encounter: Payer: Self-pay | Admitting: Rheumatology

## 2021-11-14 ENCOUNTER — Ambulatory Visit: Payer: BC Managed Care – PPO | Attending: Rheumatology | Admitting: Rheumatology

## 2021-11-14 VITALS — BP 139/93 | HR 67 | Resp 16 | Ht 61.5 in | Wt 196.6 lb

## 2021-11-14 DIAGNOSIS — C44320 Squamous cell carcinoma of skin of unspecified parts of face: Secondary | ICD-10-CM

## 2021-11-14 DIAGNOSIS — R768 Other specified abnormal immunological findings in serum: Secondary | ICD-10-CM

## 2021-11-14 DIAGNOSIS — M25511 Pain in right shoulder: Secondary | ICD-10-CM

## 2021-11-14 DIAGNOSIS — M170 Bilateral primary osteoarthritis of knee: Secondary | ICD-10-CM | POA: Diagnosis not present

## 2021-11-14 DIAGNOSIS — M47816 Spondylosis without myelopathy or radiculopathy, lumbar region: Secondary | ICD-10-CM

## 2021-11-14 DIAGNOSIS — M25512 Pain in left shoulder: Secondary | ICD-10-CM

## 2021-11-14 DIAGNOSIS — M19041 Primary osteoarthritis, right hand: Secondary | ICD-10-CM | POA: Diagnosis not present

## 2021-11-14 DIAGNOSIS — Z96642 Presence of left artificial hip joint: Secondary | ICD-10-CM | POA: Diagnosis not present

## 2021-11-14 DIAGNOSIS — M7712 Lateral epicondylitis, left elbow: Secondary | ICD-10-CM

## 2021-11-14 DIAGNOSIS — M79671 Pain in right foot: Secondary | ICD-10-CM

## 2021-11-14 DIAGNOSIS — I10 Essential (primary) hypertension: Secondary | ICD-10-CM

## 2021-11-14 DIAGNOSIS — M19042 Primary osteoarthritis, left hand: Secondary | ICD-10-CM

## 2021-11-14 DIAGNOSIS — Z8719 Personal history of other diseases of the digestive system: Secondary | ICD-10-CM

## 2021-11-14 DIAGNOSIS — M7062 Trochanteric bursitis, left hip: Secondary | ICD-10-CM

## 2021-11-14 DIAGNOSIS — G8929 Other chronic pain: Secondary | ICD-10-CM

## 2021-11-14 DIAGNOSIS — E559 Vitamin D deficiency, unspecified: Secondary | ICD-10-CM

## 2021-11-14 DIAGNOSIS — E538 Deficiency of other specified B group vitamins: Secondary | ICD-10-CM

## 2021-11-14 DIAGNOSIS — J45990 Exercise induced bronchospasm: Secondary | ICD-10-CM

## 2021-11-14 DIAGNOSIS — Z8261 Family history of arthritis: Secondary | ICD-10-CM

## 2021-11-15 ENCOUNTER — Encounter: Payer: Self-pay | Admitting: Family Medicine

## 2021-11-18 ENCOUNTER — Ambulatory Visit (INDEPENDENT_AMBULATORY_CARE_PROVIDER_SITE_OTHER): Payer: BC Managed Care – PPO | Admitting: Family Medicine

## 2021-11-18 ENCOUNTER — Encounter: Payer: Self-pay | Admitting: Family Medicine

## 2021-11-18 VITALS — BP 130/76 | HR 67 | Temp 98.4°F | Ht 61.5 in | Wt 198.2 lb

## 2021-11-18 DIAGNOSIS — M62838 Other muscle spasm: Secondary | ICD-10-CM | POA: Diagnosis not present

## 2021-11-18 NOTE — Patient Instructions (Addendum)
Weight loss clinic: [phone]  Give [place] 2-3 business days to get the results of your labs back.   Heat (pad or rice pillow in microwave) over affected area, 10-15 minutes twice daily.   Ice/cold pack over area for 10-15 min twice daily.  OK to take Tylenol 1000 mg (2 extra strength tabs) or 975 mg (3 regular strength tabs) every 6 hours as needed.  Let us know if you need anything.

## 2021-11-18 NOTE — Progress Notes (Signed)
Musculoskeletal Exam  Patient: Stephanie Castro DOB: 21-Dec-1965  DOS: 11/18/2021  SUBJECTIVE:  Chief Complaint:   Chief Complaint  Patient presents with   Groin Pain    Stephanie Castro is a 56 y.o.  female for evaluation and treatment of inner thigh pain.   Onset:  4 days ago. No inj or change in activity.  Location: inner thighs Character:   tightening   Progression of issue: has not had since Th night She drinks lots of water.  Associated symptoms: hx of cramping on L thigh after L knee replacement No jt issues, bruising, redness, swelling, ROM issues.  Treatment: to date has been muscle relaxers.   Neurovascular symptoms: no  Past Medical History:  Diagnosis Date   Arthritis    both knees , back and elbows   BMI 35.0-35.9,adult 05/15/2013   Breast cancer screening, high risk patient 04/04/2013   Pt states she had mmg 2015 nml results, ordered by Dr  Ruben Gottron, [facility] health care. Results are not in Care everywhere. Mammogram completed 06/12/2015 at [facility] in [place], [place] Category 1    Cardiac murmur 10/19/2019   Chronic pain of right knee 04/13/2020   Class 2 severe obesity due to excess calories with serious comorbidity and body mass index (BMI) of 35.0 to 35.9 in adult [facility]) 09/14/2020   Concern about female breast disease without diagnosis 03/17/2018   DOE (dyspnea on exertion) 08/08/2015   Dysplastic nevus    beneath r eye   Edema of extremities 07/07/2013   Last Assessment & Plan:  Pt takes HCTZ daily.   Last Assessment & Plan:  Formatting of this note might be different from the original. Pt takes HCTZ daily.   Elevated BP without diagnosis of hypertension 06/15/2015   Elevated uric acid in blood 05/02/2020   Essential hypertension 02/26/2017   Exercise-induced asthma    Fatigue 07/06/2013   Last Assessment & Plan:  Will review previous records from previous PCP, pt had thyroid done recently and  was wnl. Pt had questions re lupus/MS and how to get tested for these.   Last Assessment & Plan:  Formatting of this note might be different from the original. Will review previous records from previous PCP, pt had thyroid done recently and was wnl. Pt had questions re lupus/MS and how to get    GERD (gastroesophageal reflux disease)    Greater trochanteric bursitis of left hip 08/30/2020   Injection given August 30, 2020   Hypertension    Lateral epicondylitis of both elbows 08/06/2018   Left knee pain 08/30/2020   Low TSH level 04/22/2013   Mixed dyslipidemia 02/22/2020   Myalgia 07/06/2013   Last Assessment & Plan:  Formatting of this note might be different from the original. Will check labs today including sed rate, ANA/RF.  Last Assessment & Plan:  Formatting of this note might be different from the original. Pt would like to be tested for lupus in the future. Has been tested for RA and was negative   Other and unspecified hyperlipidemia 04/06/2011   Palpitations 10/19/2019   Perimenopausal 05/15/2013   Periodic edema 04/04/2013   Pt reports taking "water pill" for many years due to feeling "swollen". Historically, took HCTZ but became hypokalemic. She was switched to triamterene-HCTZ with no more hypokalemic episodes.     Preventative health care 04/04/2013   Primary localized osteoarthritis of right knee 10/13/2014   Squamous cell carcinoma, face  02/24/2014   Tear of medial meniscus of right knee 10/13/2014   Vitamin B12 deficiency 05/15/2013   Last Assessment & Plan:  Formatting of this note might be different from the original. Recheck level today, may need to restart monthly Vit B12 shots   Vitamin D deficiency 02/24/2014   Weight gain 04/24/2015    Objective: VITAL SIGNS: BP 130/76 (BP Location: Left Arm, Cuff Size: Large)   Pulse 67   Temp 98.4 F (36.9 C) (Oral)   Ht 5' 1.5" (1.562 m)   Wt 198 lb 4 oz (89.9 kg)   SpO2 96%   BMI 36.85 kg/m  Constitutional: Well formed, well developed. No  acute distress. Thorax & Lungs: No accessory muscle use Musculoskeletal: Thighs.   Normal active range of motion: yes.   Normal passive range of motion: yes Tenderness to palpation: no Deformity: no Ecchymosis: no Neurologic: Normal sensory function. No focal deficits noted. DTR's equal and symmetric in LE's. No clonus. Gait nml.  Psychiatric: Normal mood. Age appropriate judgment and insight. Alert & oriented x 3.    Assessment:  Muscle spasm - Plan: CBC, Comprehensive metabolic panel, VITAMIN D 25 Hydroxy (Vit-D Deficiency, Fractures), TSH, Magnesium  Plan: Stretches/exercises, heat, ice, Tylenol. Cont methocarbamol prn. Ck labs. Follow up w Dr. Tamala Julian if labs WNL and if this happens again.  F/u prn/as originally scheduled. The patient voiced understanding and agreement to the plan.   [place], DO 11/18/21  4:21 PM

## 2021-11-19 LAB — VITAMIN D 25 HYDROXY (VIT D DEFICIENCY, FRACTURES): VITD: 42.39 ng/mL (ref 30.00–100.00)

## 2021-11-19 LAB — COMPREHENSIVE METABOLIC PANEL
ALT: 9 U/L (ref 0–35)
AST: 19 U/L (ref 0–37)
Albumin: 4.4 g/dL (ref 3.5–5.2)
Alkaline Phosphatase: 76 U/L (ref 39–117)
BUN: 15 mg/dL (ref 6–23)
CO2: 27 mEq/L (ref 19–32)
Calcium: 8.8 mg/dL (ref 8.4–10.5)
Chloride: 105 mEq/L (ref 96–112)
Creatinine, Ser: 1.01 mg/dL (ref 0.40–1.20)
GFR: 61.85 mL/min (ref >60.00)
Glucose, Bld: 84 mg/dL (ref 70–99)
Potassium: 3.5 mEq/L (ref 3.5–5.1)
Sodium: 140 mEq/L (ref 135–145)
Total Bilirubin: 0.4 mg/dL (ref 0.2–1.2)
Total Protein: 7.1 g/dL (ref 6.0–8.3)

## 2021-11-19 LAB — TSH: TSH: 2.89 u[IU]/mL (ref 0.35–5.50)

## 2021-11-19 LAB — CBC
HCT: 39.5 % (ref 36.0–46.0)
Hemoglobin: 13 g/dL (ref 12.0–15.0)
MCHC: 32.9 g/dL (ref 30.0–36.0)
MCV: 89.8 fl (ref 78.0–100.0)
Platelets: 204 10*3/uL (ref 150.0–400.0)
RBC: 4.4 Mil/uL (ref 3.87–5.11)
RDW: 13.7 % (ref 11.5–15.5)
WBC: 6.7 10*3/uL (ref 4.0–10.5)

## 2021-11-19 LAB — MAGNESIUM: Magnesium: 1.8 mg/dL (ref 1.5–2.5)

## 2021-11-21 ENCOUNTER — Encounter (INDEPENDENT_AMBULATORY_CARE_PROVIDER_SITE_OTHER): Payer: Self-pay | Admitting: Family Medicine

## 2021-11-21 ENCOUNTER — Ambulatory Visit (INDEPENDENT_AMBULATORY_CARE_PROVIDER_SITE_OTHER): Payer: Self-pay | Admitting: Family Medicine

## 2021-11-21 VITALS — BP 103/67 | HR 83 | Temp 98.2°F | Ht 62.0 in | Wt 194.0 lb

## 2021-11-21 DIAGNOSIS — Z6835 Body mass index (BMI) 35.0-35.9, adult: Secondary | ICD-10-CM

## 2021-11-21 DIAGNOSIS — E669 Obesity, unspecified: Secondary | ICD-10-CM

## 2021-11-21 DIAGNOSIS — E559 Vitamin D deficiency, unspecified: Secondary | ICD-10-CM

## 2021-11-21 DIAGNOSIS — Z0289 Encounter for other administrative examinations: Secondary | ICD-10-CM

## 2021-11-21 DIAGNOSIS — E7849 Other hyperlipidemia: Secondary | ICD-10-CM

## 2021-11-21 DIAGNOSIS — M069 Rheumatoid arthritis, unspecified: Secondary | ICD-10-CM

## 2021-12-02 ENCOUNTER — Other Ambulatory Visit (HOSPITAL_BASED_OUTPATIENT_CLINIC_OR_DEPARTMENT_OTHER): Payer: Self-pay

## 2021-12-02 NOTE — Progress Notes (Signed)
Office: [phone]  /  Fax: [phone]   Initial Visit  Stephanie Castro was seen in clinic today to evaluate for obesity. She is interested in losing weight to improve overall health and reduce the risk of weight related complications. She presents today to review program treatment options, initial physical assessment, and evaluation.     She was referred by: PCP  When asked what else they would like to accomplish? She states: Improve existing medical conditions and Improve quality of life  When asked how has your weight affected you? She states: Contributed to orthopedic problems or mobility issues  Some associated conditions: Hypertension, Arthritis (left total hip replacement), Hyperlipidemia, and Lung disease (asthma, exercise induced)  Current level of physical activity: Walking (on the treadmill) and Other: pilates  Current or previous pharmacotherapy: Phentermine (she did not like how it made her feel)  Response to medication: She has done weight watchers in the past, physicians weight loss and lost weight in the past.   Past medical history includes:   Past Medical History:  Diagnosis Date   Arthritis    both knees , back and elbows   BMI 35.0-35.9,adult 05/15/2013   Breast cancer screening, high risk patient 04/04/2013   Pt states she had mmg 2015 nml results, ordered by Dr  Ruben Gottron, [facility] health care. Results are not in Care everywhere. Mammogram completed 06/12/2015 at [facility] in [place], [place] Category 1    Cardiac murmur 10/19/2019   Chronic pain of right knee 04/13/2020   Class 2 severe obesity due to excess calories with serious comorbidity and body mass index (BMI) of 35.0 to 35.9 in adult [facility]) 09/14/2020   Concern about female breast disease without diagnosis 03/17/2018   DOE (dyspnea on exertion) 08/08/2015   Dysplastic nevus    beneath r eye   Edema of extremities 07/07/2013   Last  Assessment & Plan:  Pt takes HCTZ daily.   Last Assessment & Plan:  Formatting of this note might be different from the original. Pt takes HCTZ daily.   Elevated BP without diagnosis of hypertension 06/15/2015   Elevated uric acid in blood 05/02/2020   Essential hypertension 02/26/2017   Exercise-induced asthma    Fatigue 07/06/2013   Last Assessment & Plan:  Will review previous records from previous PCP, pt had thyroid done recently and was wnl. Pt had questions re lupus/MS and how to get tested for these.   Last Assessment & Plan:  Formatting of this note might be different from the original. Will review previous records from previous PCP, pt had thyroid done recently and was wnl. Pt had questions re lupus/MS and how to get    GERD (gastroesophageal reflux disease)    Greater trochanteric bursitis of left hip 08/30/2020   Injection given August 30, 2020   Hypertension    Lateral epicondylitis of both elbows 08/06/2018   Left knee pain 08/30/2020   Low TSH level 04/22/2013   Mixed dyslipidemia 02/22/2020   Myalgia 07/06/2013   Last Assessment & Plan:  Formatting of this note might be different from the original. Will check labs today including sed rate, ANA/RF.  Last Assessment & Plan:  Formatting of this note might be different from the original. Pt would like to be tested for lupus in the future. Has been tested for RA and was negative   Other and unspecified hyperlipidemia 04/06/2011   Palpitations 10/19/2019   Perimenopausal 05/15/2013  Periodic edema 04/04/2013   Pt reports taking "water pill" for many years due to feeling "swollen". Historically, took HCTZ but became hypokalemic. She was switched to triamterene-HCTZ with no more hypokalemic episodes.     Preventative health care 04/04/2013   Primary localized osteoarthritis of right knee 10/13/2014   Squamous cell carcinoma, face 02/24/2014   Tear of medial meniscus of right knee 10/13/2014   Vitamin B12 deficiency 05/15/2013   Last Assessment & Plan:   Formatting of this note might be different from the original. Recheck level today, may need to restart monthly Vit B12 shots   Vitamin D deficiency 02/24/2014   Weight gain 04/24/2015     Objective:   BP 103/67   Pulse 83   Temp 98.2 F (36.8 C)   Ht '5\' 2"'$  (1.575 m)   Wt 194 lb (88 kg)   SpO2 98%   BMI 35.48 kg/m  She was weighed on the bioimpedance scale: Body mass index is 35.48 kg/m.  Visceral Fat Rating:12, Body Fat%:42.6.  General:  Alert, oriented and cooperative. Patient is in no acute distress.  Respiratory: Normal respiratory effort, no problems with respiration noted  Extremities: Normal range of motion.    Mental Status: Normal mood and affect. Normal behavior. Normal judgment and thought content.   Assessment and Plan:  1. Other hyperlipidemia Yasamin is on Crestor 10 mg daily with no side effects noted.  She is ready to work on weight loss.  Plan: We will check labs at her next visit. Anastassia will fill out her self-assessment form and we will start her eating plan after reviewing her paperwork.  2. Vitamin D deficiency Delise is taking Vitamin D 50,000 IU weekly with no side effects noted.   Plan: We will check labs at her next visit and will follow-up.   3. Rheumatoid arthritis involving left hip, unspecified whether rheumatoid factor present (HCC) Denicia recently had a left total hip replacement. She is following up regularly with Ortho.   Plan: Sacoya will plan to work on her weight loss to help decrease joint pain.   4. Class 2 severe obesity due to excess calories with serious comorbidity and body mass index (BMI) of 35.0 to 35.9 in adult [facility]) We reviewed weight, biometrics, associated medical conditions and contributing factors with patient. She would benefit from weight loss therapy via a modified calorie, low-carb, high-protein nutritional plan tailored to their REE (resting energy expenditure) which will be determined by indirect calorimetry.  We will also assess  for cardiometabolic risk and nutritional derangements via fasting serologies at her next appointment.      Obesity Treatment / Action Plan:  Patient will work on garnering support from family and friends to begin weight loss journey. Will work on eliminating or reducing the presence of highly palatable, calorie dense foods in the home. Will complete provided nutritional and psychosocial assessment questionnaire before the next appointment. Will be scheduled for indirect calorimetry to determine resting energy expenditure in a fasting state.  This will allow [place] to create a reduced calorie, high-protein meal plan to promote loss of fat mass while preserving muscle mass.  Obesity Education Performed Today:  She was weighed on the bioimpedance scale and results were discussed and documented in the synopsis.  We discussed obesity as a disease and the importance of a more detailed evaluation of all the factors contributing to the disease.  We discussed the importance of long term lifestyle changes which include nutrition, exercise and behavioral modifications as well  as the importance of customizing this to her specific health and social needs.  We discussed the benefits of reaching a healthier weight to alleviate the symptoms of existing conditions and reduce the risks of the biomechanical, metabolic and psychological effects of obesity.  SHRUTHI NORTHRUP appears to be in the action stage of change and states they are ready to start intensive lifestyle modifications and behavioral modifications.  40 minutes was spent today on this visit including the above counseling, pre-visit chart review, and post-visit documentation.  Reviewed by clinician on day of visit: allergies, medications, problem list, medical history, surgical history, family history, social history, and previous encounter notes.   I, Trixie Dredge, am acting as transcriptionist for Dennard Nip, MD.    I have reviewed the above  documentation for accuracy and completeness, and I agree with the above. Dennard Nip, MD  I have personally spent 31 minutes total time today in preparation, patient care, and documentation for this visit, including the following: review of clinical lab tests; review of medical tests/procedures/services.

## 2021-12-24 DIAGNOSIS — L570 Actinic keratosis: Secondary | ICD-10-CM | POA: Diagnosis not present

## 2022-01-15 ENCOUNTER — Other Ambulatory Visit (HOSPITAL_BASED_OUTPATIENT_CLINIC_OR_DEPARTMENT_OTHER): Payer: Self-pay

## 2022-01-15 ENCOUNTER — Other Ambulatory Visit: Payer: Self-pay | Admitting: Family Medicine

## 2022-01-15 DIAGNOSIS — M7989 Other specified soft tissue disorders: Secondary | ICD-10-CM

## 2022-01-15 MED ORDER — POTASSIUM CHLORIDE CRYS ER 10 MEQ PO TBCR
20.0000 meq | EXTENDED_RELEASE_TABLET | ORAL | 2 refills | Status: AC
  Filled 2022-01-15: qty 60, 30d supply, fill #0
  Filled 2022-03-04: qty 60, 30d supply, fill #1
  Filled 2022-05-15: qty 60, 30d supply, fill #2

## 2022-02-19 ENCOUNTER — Ambulatory Visit: Payer: BC Managed Care – PPO | Admitting: Family Medicine

## 2022-03-04 ENCOUNTER — Other Ambulatory Visit: Payer: Self-pay

## 2022-03-04 ENCOUNTER — Other Ambulatory Visit (HOSPITAL_BASED_OUTPATIENT_CLINIC_OR_DEPARTMENT_OTHER): Payer: Self-pay

## 2022-03-04 ENCOUNTER — Other Ambulatory Visit: Payer: Self-pay | Admitting: Family Medicine

## 2022-03-04 ENCOUNTER — Other Ambulatory Visit: Payer: Self-pay | Admitting: Cardiology

## 2022-03-04 MED ORDER — VITAMIN D (ERGOCALCIFEROL) 1.25 MG (50000 UNIT) PO CAPS
50000.0000 [IU] | ORAL_CAPSULE | ORAL | 0 refills | Status: DC
  Filled 2022-03-04: qty 12, 84d supply, fill #0

## 2022-03-04 MED ORDER — ROSUVASTATIN CALCIUM 10 MG PO TABS
10.0000 mg | ORAL_TABLET | Freq: Every day | ORAL | 0 refills | Status: DC
  Filled 2022-03-04: qty 30, 30d supply, fill #0

## 2022-03-07 ENCOUNTER — Other Ambulatory Visit (HOSPITAL_BASED_OUTPATIENT_CLINIC_OR_DEPARTMENT_OTHER): Payer: Self-pay

## 2022-03-07 ENCOUNTER — Encounter: Payer: Self-pay | Admitting: Family Medicine

## 2022-03-07 ENCOUNTER — Ambulatory Visit: Payer: BC Managed Care – PPO | Admitting: Family Medicine

## 2022-03-07 VITALS — BP 128/78 | HR 58 | Temp 97.8°F | Ht 61.5 in | Wt 194.2 lb

## 2022-03-07 DIAGNOSIS — H539 Unspecified visual disturbance: Secondary | ICD-10-CM | POA: Diagnosis not present

## 2022-03-07 DIAGNOSIS — H269 Unspecified cataract: Secondary | ICD-10-CM

## 2022-03-07 MED ORDER — ALBUTEROL SULFATE HFA 108 (90 BASE) MCG/ACT IN AERS
1.0000 | INHALATION_SPRAY | Freq: Four times a day (QID) | RESPIRATORY_TRACT | 1 refills | Status: AC | PRN
  Filled 2022-03-07: qty 6.7, 25d supply, fill #0

## 2022-03-07 NOTE — Progress Notes (Signed)
Chief Complaint  Patient presents with   Vision has declined per Optomitrist.     Subjective: Patient is a 57 y.o. female here for vision changes.  Over the past year, pt's vision has declined. She saw her optometrist who told her she needs her thyroid and possibly gut checked. She had nml TSH testing 3.5 mo ago. No GI s/s's. Hx of cataracts. No trauma, pain, redness, drainage, difficulty moving eyes, and denies anything getting in her eyes.  Sometimes her eyes get dry if she stares at something while reading or looking at a screen for too long.  She does not use any artificial tears.  Past Medical History:  Diagnosis Date   Arthritis    both knees , back and elbows   BMI 35.0-35.9,adult 05/15/2013   Breast cancer screening, high risk patient 04/04/2013   Pt states she had mmg 2015 nml results, ordered by Dr  Ruben Gottron, [facility] health care. Results are not in Care everywhere. Mammogram completed 06/12/2015 at [facility] in [place], [place] Category 1    Cardiac murmur 10/19/2019   Chronic pain of right knee 04/13/2020   Class 2 severe obesity due to excess calories with serious comorbidity and body mass index (BMI) of 35.0 to 35.9 in adult [facility]) 09/14/2020   Concern about female breast disease without diagnosis 03/17/2018   DOE (dyspnea on exertion) 08/08/2015   Dysplastic nevus    beneath r eye   Edema of extremities 07/07/2013   Last Assessment & Plan:  Pt takes HCTZ daily.   Last Assessment & Plan:  Formatting of this note might be different from the original. Pt takes HCTZ daily.   Elevated BP without diagnosis of hypertension 06/15/2015   Elevated uric acid in blood 05/02/2020   Essential hypertension 02/26/2017   Exercise-induced asthma    Fatigue 07/06/2013   Last Assessment & Plan:  Will review previous records from previous PCP, pt had thyroid done recently and was wnl. Pt had questions re lupus/MS and how to get  tested for these.   Last Assessment & Plan:  Formatting of this note might be different from the original. Will review previous records from previous PCP, pt had thyroid done recently and was wnl. Pt had questions re lupus/MS and how to get    GERD (gastroesophageal reflux disease)    Greater trochanteric bursitis of left hip 08/30/2020   Injection given August 30, 2020   Hypertension    Lateral epicondylitis of both elbows 08/06/2018   Left knee pain 08/30/2020   Low TSH level 04/22/2013   Mixed dyslipidemia 02/22/2020   Myalgia 07/06/2013   Last Assessment & Plan:  Formatting of this note might be different from the original. Will check labs today including sed rate, ANA/RF.  Last Assessment & Plan:  Formatting of this note might be different from the original. Pt would like to be tested for lupus in the future. Has been tested for RA and was negative   Other and unspecified hyperlipidemia 04/06/2011   Palpitations 10/19/2019   Perimenopausal 05/15/2013   Periodic edema 04/04/2013   Pt reports taking "water pill" for many years due to feeling "swollen". Historically, took HCTZ but became hypokalemic. She was switched to triamterene-HCTZ with no more hypokalemic episodes.     Preventative health care 04/04/2013   Primary localized osteoarthritis of right knee 10/13/2014   Squamous cell carcinoma, face 02/24/2014   Tear of medial meniscus of right knee  10/13/2014   Vitamin B12 deficiency 05/15/2013   Last Assessment & Plan:  Formatting of this note might be different from the original. Recheck level today, may need to restart monthly Vit B12 shots   Vitamin D deficiency 02/24/2014   Weight gain 04/24/2015    Objective: BP 128/78 (BP Location: Left Arm, Patient Position: Sitting, Cuff Size: Normal)   Pulse (!) 58   Temp 97.8 F (36.6 C) (Oral)   Ht 5' 1.5" (1.562 m)   Wt 194 lb 4 oz (88.1 kg)   SpO2 95%   BMI 36.11 kg/m  General: Awake, appears stated age Eyes: Sclera white, PERRLA, EOMI, no TTP over  closed eyes with pressure over the globes Lungs: No accessory muscle use Psych: Age appropriate judgment and insight, normal affect and mood  Assessment and Plan: Changes in vision - Plan: Ambulatory referral to Ophthalmology  Cataract of both eyes, unspecified cataract type - Plan: Ambulatory referral to Ophthalmology  Refer to ophthalmology.  I do not think any lab work is indicated at this time.  Artificial tears recommended. The patient voiced understanding and agreement to the plan.  [place], DO 03/07/22  8:43 AM

## 2022-03-07 NOTE — Patient Instructions (Addendum)
If you do not hear anything about your referral in the next 1-2 weeks, call our office and ask for an update.  Please contact the GI team at: [phone]  Let us know if you need anything.

## 2022-03-28 ENCOUNTER — Encounter: Payer: Self-pay | Admitting: Family Medicine

## 2022-03-28 ENCOUNTER — Ambulatory Visit: Payer: BC Managed Care – PPO | Admitting: Family Medicine

## 2022-03-28 VITALS — BP 110/72 | HR 61 | Temp 97.6°F | Ht 61.5 in | Wt 189.2 lb

## 2022-03-28 DIAGNOSIS — J029 Acute pharyngitis, unspecified: Secondary | ICD-10-CM

## 2022-03-28 LAB — POCT RAPID STREP A (OFFICE): Rapid Strep A Screen: NEGATIVE

## 2022-03-28 MED ORDER — METHYLPREDNISOLONE ACETATE 80 MG/ML IJ SUSP
80.0000 mg | Freq: Once | INTRAMUSCULAR | Status: AC
  Administered 2022-03-28: 80 mg via INTRAMUSCULAR

## 2022-03-28 NOTE — Patient Instructions (Signed)
Consider throat lozenges, salt water gargles and an air humidifier for symptomatic care.   OK to take Tylenol 1000 mg (2 extra strength tabs) or 975 mg (3 regular strength tabs) every 6 hours as needed.  Let us know if you need anything.

## 2022-03-28 NOTE — Progress Notes (Signed)
SUBJECTIVE:   Stephanie Castro is a 57 y.o. female presents to the clinic for:  Chief Complaint  Patient presents with   Sore Throat    Complains of sore throat for 3 days.  Other associated symptoms: sinus congestion and sore throat.  Denies: sinus pain, rhinorrhea, itchy watery eyes, ear pain, ear drainage, wheezing, shortness of breath, myalgia, and fevers Sick Contacts: none known Therapy to date: cough drops, Vit C  Social History   Tobacco Use  Smoking Status Former   Packs/day: 1.00   Years: 20.00   Total pack years: 20.00   Types: Cigarettes   Quit date: 2005   Years since quitting: 19.1   Passive exposure: Never  Smokeless Tobacco Never    Patient's medications, allergies, past medical, surgical, social and family histories were reviewed and updated as appropriate.  OBJECTIVE:  BP 110/72 (BP Location: Left Arm, Patient Position: Sitting, Cuff Size: Normal)   Pulse 61   Temp 97.6 F (36.4 C) (Oral)   Ht 5' 1.5" (1.562 m)   Wt 189 lb 4 oz (85.8 kg)   SpO2 96%   BMI 35.18 kg/m  General: Awake, alert, appearing stated age Eyes: conjunctivae and sclerae clear Ears: normal TMs bilaterally Nose: no visible exudate Oropharynx: lips, mucosa, and tongue normal; teeth and gums normal and pharynx is erythematous without exudate.  Neck: supple, no significant adenopathy Lungs: clear to auscultation, no wheezes, rales or rhonchi, symmetric air entry, normal effort Heart: RRR Skin:reveals no rash Psych: Age appropriate judgment and insight  ASSESSMENT/PLAN:  Sore throat - Plan: POCT rapid strep A  Continue to practice good hand hygiene and push fluids. Rapid strep neg.  Depomedrol IM injection, acetaminophen for pain. F/u prn. Pt voiced understanding and agreement to the plan.  Vowinckel, DO 03/28/22 2:17 PM

## 2022-04-22 ENCOUNTER — Other Ambulatory Visit (HOSPITAL_BASED_OUTPATIENT_CLINIC_OR_DEPARTMENT_OTHER): Payer: Self-pay

## 2022-04-22 ENCOUNTER — Other Ambulatory Visit: Payer: Self-pay | Admitting: Cardiology

## 2022-04-22 MED ORDER — ROSUVASTATIN CALCIUM 10 MG PO TABS
10.0000 mg | ORAL_TABLET | Freq: Every day | ORAL | 0 refills | Status: DC
  Filled 2022-04-22 – 2022-04-23 (×2): qty 15, 15d supply, fill #0

## 2022-04-23 ENCOUNTER — Other Ambulatory Visit (HOSPITAL_BASED_OUTPATIENT_CLINIC_OR_DEPARTMENT_OTHER): Payer: Self-pay

## 2022-05-12 ENCOUNTER — Encounter: Payer: Self-pay | Admitting: *Deleted

## 2022-05-12 DIAGNOSIS — Z96642 Presence of left artificial hip joint: Secondary | ICD-10-CM | POA: Diagnosis not present

## 2022-05-15 ENCOUNTER — Other Ambulatory Visit: Payer: Self-pay | Admitting: Family Medicine

## 2022-05-15 ENCOUNTER — Other Ambulatory Visit (HOSPITAL_BASED_OUTPATIENT_CLINIC_OR_DEPARTMENT_OTHER): Payer: Self-pay

## 2022-05-15 ENCOUNTER — Other Ambulatory Visit: Payer: Self-pay | Admitting: Cardiology

## 2022-05-15 DIAGNOSIS — M7989 Other specified soft tissue disorders: Secondary | ICD-10-CM

## 2022-05-15 MED ORDER — FUROSEMIDE 40 MG PO TABS
40.0000 mg | ORAL_TABLET | Freq: Every day | ORAL | 3 refills | Status: AC
  Filled 2022-05-15: qty 30, 30d supply, fill #0

## 2022-05-15 MED ORDER — ROSUVASTATIN CALCIUM 10 MG PO TABS
10.0000 mg | ORAL_TABLET | Freq: Every day | ORAL | 0 refills | Status: DC
  Filled 2022-05-15: qty 15, 15d supply, fill #0

## 2022-05-16 ENCOUNTER — Other Ambulatory Visit: Payer: Self-pay

## 2022-05-21 ENCOUNTER — Encounter: Payer: Self-pay | Admitting: Family Medicine

## 2022-05-21 ENCOUNTER — Ambulatory Visit: Payer: BC Managed Care – PPO | Admitting: Family Medicine

## 2022-05-21 ENCOUNTER — Other Ambulatory Visit (HOSPITAL_BASED_OUTPATIENT_CLINIC_OR_DEPARTMENT_OTHER): Payer: Self-pay

## 2022-05-21 VITALS — BP 124/81 | HR 70 | Temp 98.0°F | Ht 61.5 in | Wt 185.0 lb

## 2022-05-21 DIAGNOSIS — J069 Acute upper respiratory infection, unspecified: Secondary | ICD-10-CM | POA: Diagnosis not present

## 2022-05-21 MED ORDER — AZITHROMYCIN 250 MG PO TABS
ORAL_TABLET | ORAL | 0 refills | Status: AC
  Filled 2022-05-21: qty 6, 5d supply, fill #0

## 2022-05-21 MED ORDER — FLUTICASONE PROPIONATE 50 MCG/ACT NA SUSP
2.0000 | Freq: Every day | NASAL | 6 refills | Status: AC
  Filled 2022-05-21: qty 16, 30d supply, fill #0

## 2022-05-21 MED ORDER — CETIRIZINE HCL 10 MG PO TABS
10.0000 mg | ORAL_TABLET | Freq: Every day | ORAL | 2 refills | Status: AC
  Filled 2022-05-21: qty 100, 100d supply, fill #0

## 2022-05-21 MED ORDER — PREDNISONE 20 MG PO TABS
40.0000 mg | ORAL_TABLET | Freq: Every day | ORAL | 0 refills | Status: AC
  Filled 2022-05-21: qty 10, 5d supply, fill #0

## 2022-05-21 MED ORDER — SALINE SPRAY 0.65 % NA SOLN
1.0000 | NASAL | 3 refills | Status: AC | PRN
  Filled 2022-05-21: qty 44, 30d supply, fill #0

## 2022-05-21 NOTE — Patient Instructions (Signed)
Negative home COVID test x2 Likely allergic given recent prolonged outdoor allergen exposure, could be viral as well. We discussed viral vs bacterial infections.  Start with daily Zyrtec (antihistamine), Flonase, prednisone burst. Use as needed nasal saline rinse and albuterol inhaler.  Continue supportive measures including rest, hydration, humidifier use, steam showers, warm compresses to sinuses, warm liquids with lemon and honey, and over-the-counter cough, cold, and analgesics as needed.  If not improving by the weekend, or if significantly worse before then, start the antibiotics, otherwise try to hold off on antibiotics.   Please contact office for follow-up if symptoms do not improve or worsen. Seek emergency care if symptoms become severe.

## 2022-05-21 NOTE — Progress Notes (Signed)
Acute Office Visit  Subjective:     Patient ID: Stephanie Castro, female    DOB: Jun 05, 1965, 57 y.o.   MRN: [id number]  Chief Complaint  Patient presents with   Cough   Hoarse      Upper Respiratory Infection: Patient complains of symptoms of a URI. Symptoms include  throat irritation, hoarse voice, dry cough, postnasal drainage, mild dyspnea at times, headache, itchy/water eyes, bilateral ear pressure, tight breathing . Onset of symptoms was 5 days ago, unchanged since that time. She is drinking plenty of fluids. Evaluation to date: none. Treatment to date: none. Her husband had COVID last week, but she has had multiple negative COVID tests over the past few days. She reports being outside several hours on Saturday prior to symptoms starting - she does have spring allergies. Denies chest pain, wheezing, sneezing, fevers, body aches, GI/GU symptoms.       All review of systems negative except what is listed in the HPI      Objective:    BP 124/81   Pulse 70   Temp 98 F (36.7 C) (Oral)   Ht 5' 1.5" (1.562 m)   Wt 185 lb (83.9 kg)   SpO2 100%   BMI 34.39 kg/m    Physical Exam Vitals reviewed.  Constitutional:      Appearance: Normal appearance.  HENT:     Right Ear: Tympanic membrane normal.     Left Ear: Tympanic membrane normal.     Nose: Congestion and rhinorrhea present.     Right Turbinates: Swollen.     Mouth/Throat:     Mouth: Mucous membranes are moist.     Pharynx: Oropharynx is clear.     Comments: Cobblestoning/PND Eyes:     Conjunctiva/sclera: Conjunctivae normal.  Cardiovascular:     Rate and Rhythm: Normal rate and regular rhythm.     Pulses: Normal pulses.     Heart sounds: Normal heart sounds.  Pulmonary:     Effort: Pulmonary effort is normal.     Breath sounds: Normal breath sounds. No wheezing, rhonchi or rales.  Lymphadenopathy:     Cervical: No cervical adenopathy.  Skin:    General: Skin is warm and dry.  Neurological:     Mental  Status: She is alert and oriented to person, place, and time.  Psychiatric:        Mood and Affect: Mood normal.        Behavior: Behavior normal.        Thought Content: Thought content normal.        Judgment: Judgment normal.     No results found for any visits on 05/21/22.      Assessment & Plan:   Problem List Items Addressed This Visit   None Visit Diagnoses     URI symptoms   -  Primary Negative home COVID test x2 Likely allergic given recent prolonged outdoor allergen exposure, could be viral as well. We discussed viral vs bacterial infections.  Start with daily Zyrtec (antihistamine), Flonase, prednisone burst. Use as needed nasal saline rinse and albuterol inhaler.  Continue supportive measures including rest, hydration, humidifier use, steam showers, warm compresses to sinuses, warm liquids with lemon and honey, and over-the-counter cough, cold, and analgesics as needed.  If not improving by the weekend, or if significantly worse before then, start the antibiotics, otherwise try to hold off on antibiotics.  Patient aware of signs/symptoms requiring further/urgent evaluation.    Relevant Medications   cetirizine (ZYRTEC)  10 MG tablet   fluticasone (FLONASE) 50 MCG/ACT nasal spray   predniSONE (DELTASONE) 20 MG tablet   sodium chloride (OCEAN) 0.65 % SOLN nasal spray   azithromycin (ZITHROMAX) 250 MG tablet       Meds ordered this encounter  Medications   cetirizine (ZYRTEC) 10 MG tablet    Sig: Take 1 tablet (10 mg total) by mouth daily.    Dispense:  100 tablet    Refill:  2    Order Specific Question:   Supervising Provider    Answer:   Danise Edge A [[id number]]   fluticasone (FLONASE) 50 MCG/ACT nasal spray    Sig: Place 2 sprays into both nostrils daily.    Dispense:  16 g    Refill:  6    Order Specific Question:   Supervising Provider    Answer:   Danise Edge A [[id number]]   predniSONE (DELTASONE) 20 MG tablet    Sig: Take 2 tablets (40 mg total) by mouth  daily with breakfast for 5 days.    Dispense:  10 tablet    Refill:  0    Order Specific Question:   Supervising Provider    Answer:   Danise Edge A [[id number]]   sodium chloride (OCEAN) 0.65 % SOLN nasal spray    Sig: Place 1 spray into both nostrils as needed for congestion.    Dispense:  44 mL    Refill:  3    Order Specific Question:   Supervising Provider    Answer:   Danise Edge A [[id number]]   azithromycin (ZITHROMAX) 250 MG tablet    Sig: Take 2 tablets on day 1, then 1 tablet daily on days 2 through 5    Dispense:  6 tablet    Refill:  0    Order Specific Question:   Supervising Provider    Answer:   Danise Edge A [[id number]]    Return if symptoms worsen or fail to improve.  Clayborne Dana, NP

## 2022-05-27 ENCOUNTER — Ambulatory Visit: Payer: BC Managed Care – PPO | Admitting: Family Medicine

## 2022-06-13 ENCOUNTER — Other Ambulatory Visit (HOSPITAL_BASED_OUTPATIENT_CLINIC_OR_DEPARTMENT_OTHER): Payer: Self-pay

## 2022-06-13 MED ORDER — CLINDAMYCIN HCL 300 MG PO CAPS
ORAL_CAPSULE | ORAL | 0 refills | Status: DC
  Filled 2022-06-13: qty 10, 30d supply, fill #0

## 2022-06-16 DIAGNOSIS — H35371 Puckering of macula, right eye: Secondary | ICD-10-CM | POA: Diagnosis not present

## 2022-06-16 DIAGNOSIS — H04123 Dry eye syndrome of bilateral lacrimal glands: Secondary | ICD-10-CM | POA: Diagnosis not present

## 2022-06-16 DIAGNOSIS — H2513 Age-related nuclear cataract, bilateral: Secondary | ICD-10-CM | POA: Diagnosis not present

## 2022-06-16 DIAGNOSIS — H11153 Pinguecula, bilateral: Secondary | ICD-10-CM | POA: Diagnosis not present

## 2022-06-19 ENCOUNTER — Other Ambulatory Visit (HOSPITAL_BASED_OUTPATIENT_CLINIC_OR_DEPARTMENT_OTHER): Payer: Self-pay

## 2022-06-19 MED ORDER — CLINDAMYCIN HCL 300 MG PO CAPS
300.0000 mg | ORAL_CAPSULE | Freq: Three times a day (TID) | ORAL | 0 refills | Status: DC
  Filled 2022-06-19: qty 15, 5d supply, fill #0

## 2022-06-19 MED ORDER — IBUPROFEN 800 MG PO TABS
800.0000 mg | ORAL_TABLET | Freq: Four times a day (QID) | ORAL | 0 refills | Status: DC | PRN
  Filled 2022-06-19: qty 20, 5d supply, fill #0

## 2022-06-24 DIAGNOSIS — Z96642 Presence of left artificial hip joint: Secondary | ICD-10-CM | POA: Diagnosis not present

## 2022-06-24 DIAGNOSIS — Z471 Aftercare following joint replacement surgery: Secondary | ICD-10-CM | POA: Diagnosis not present

## 2022-07-02 ENCOUNTER — Other Ambulatory Visit: Payer: Self-pay | Admitting: Cardiology

## 2022-07-03 ENCOUNTER — Other Ambulatory Visit (HOSPITAL_BASED_OUTPATIENT_CLINIC_OR_DEPARTMENT_OTHER): Payer: Self-pay

## 2022-07-03 MED ORDER — ROSUVASTATIN CALCIUM 10 MG PO TABS
10.0000 mg | ORAL_TABLET | Freq: Every day | ORAL | 0 refills | Status: DC
  Filled 2022-07-03: qty 15, 15d supply, fill #0

## 2022-07-07 ENCOUNTER — Encounter: Payer: Self-pay | Admitting: Family Medicine

## 2022-07-28 DIAGNOSIS — L918 Other hypertrophic disorders of the skin: Secondary | ICD-10-CM | POA: Diagnosis not present

## 2022-07-28 DIAGNOSIS — L821 Other seborrheic keratosis: Secondary | ICD-10-CM | POA: Diagnosis not present

## 2022-07-28 DIAGNOSIS — L814 Other melanin hyperpigmentation: Secondary | ICD-10-CM | POA: Diagnosis not present

## 2022-07-28 DIAGNOSIS — L570 Actinic keratosis: Secondary | ICD-10-CM | POA: Diagnosis not present

## 2022-07-28 DIAGNOSIS — X32XXXS Exposure to sunlight, sequela: Secondary | ICD-10-CM | POA: Diagnosis not present

## 2022-07-29 DIAGNOSIS — F4310 Post-traumatic stress disorder, unspecified: Secondary | ICD-10-CM | POA: Diagnosis not present

## 2022-08-18 DIAGNOSIS — F4310 Post-traumatic stress disorder, unspecified: Secondary | ICD-10-CM | POA: Diagnosis not present

## 2022-08-22 ENCOUNTER — Other Ambulatory Visit: Payer: Self-pay | Admitting: Family Medicine

## 2022-08-22 ENCOUNTER — Other Ambulatory Visit (HOSPITAL_BASED_OUTPATIENT_CLINIC_OR_DEPARTMENT_OTHER): Payer: Self-pay

## 2022-08-22 ENCOUNTER — Other Ambulatory Visit: Payer: Self-pay

## 2022-08-22 DIAGNOSIS — E538 Deficiency of other specified B group vitamins: Secondary | ICD-10-CM

## 2022-08-22 MED ORDER — CYANOCOBALAMIN 1000 MCG/ML IJ SOLN
1000.0000 ug | INTRAMUSCULAR | 0 refills | Status: DC
  Filled 2022-08-22: qty 6, 84d supply, fill #0
  Filled 2023-01-18: qty 6, 84d supply, fill #1

## 2022-08-25 ENCOUNTER — Other Ambulatory Visit (HOSPITAL_BASED_OUTPATIENT_CLINIC_OR_DEPARTMENT_OTHER): Payer: Self-pay

## 2022-08-27 ENCOUNTER — Other Ambulatory Visit (HOSPITAL_BASED_OUTPATIENT_CLINIC_OR_DEPARTMENT_OTHER): Payer: Self-pay

## 2022-09-02 DIAGNOSIS — F4310 Post-traumatic stress disorder, unspecified: Secondary | ICD-10-CM | POA: Diagnosis not present

## 2022-09-09 DIAGNOSIS — F4310 Post-traumatic stress disorder, unspecified: Secondary | ICD-10-CM | POA: Diagnosis not present

## 2022-09-14 ENCOUNTER — Encounter: Payer: Self-pay | Admitting: Family Medicine

## 2022-09-15 NOTE — Telephone Encounter (Signed)
Pt scheduled  

## 2022-09-16 ENCOUNTER — Ambulatory Visit: Payer: BC Managed Care – PPO | Admitting: Family

## 2022-09-16 VITALS — BP 108/60 | HR 59 | Temp 97.8°F | Resp 18 | Ht 61.5 in | Wt 187.0 lb

## 2022-09-16 DIAGNOSIS — R7309 Other abnormal glucose: Secondary | ICD-10-CM

## 2022-09-16 DIAGNOSIS — R42 Dizziness and giddiness: Secondary | ICD-10-CM

## 2022-09-16 DIAGNOSIS — R790 Abnormal level of blood mineral: Secondary | ICD-10-CM | POA: Diagnosis not present

## 2022-09-16 DIAGNOSIS — R7989 Other specified abnormal findings of blood chemistry: Secondary | ICD-10-CM | POA: Diagnosis not present

## 2022-09-16 LAB — CBC WITH DIFFERENTIAL/PLATELET
Basophils Absolute: 0 10*3/uL (ref 0.0–0.1)
Basophils Relative: 0.7 % (ref 0.0–3.0)
Eosinophils Absolute: 0.1 10*3/uL (ref 0.0–0.7)
Eosinophils Relative: 2.5 % (ref 0.0–5.0)
HCT: 41 % (ref 36.0–46.0)
Hemoglobin: 13.3 g/dL (ref 12.0–15.0)
Lymphocytes Relative: 42.3 % (ref 12.0–46.0)
Lymphs Abs: 2 10*3/uL (ref 0.7–4.0)
MCHC: 32.5 g/dL (ref 30.0–36.0)
MCV: 90.1 fl (ref 78.0–100.0)
Monocytes Absolute: 0.3 10*3/uL (ref 0.1–1.0)
Monocytes Relative: 7.1 % (ref 3.0–12.0)
Neutro Abs: 2.2 10*3/uL (ref 1.4–7.7)
Neutrophils Relative %: 47.4 % (ref 43.0–77.0)
Platelets: 201 10*3/uL (ref 150.0–400.0)
RBC: 4.55 Mil/uL (ref 3.87–5.11)
RDW: 13.5 % (ref 11.5–15.5)
WBC: 4.7 10*3/uL (ref 4.0–10.5)

## 2022-09-16 LAB — COMPREHENSIVE METABOLIC PANEL
ALT: 11 U/L (ref 0–35)
AST: 17 U/L (ref 0–37)
Albumin: 4.3 g/dL (ref 3.5–5.2)
Alkaline Phosphatase: 78 U/L (ref 39–117)
BUN: 13 mg/dL (ref 6–23)
CO2: 25 mEq/L (ref 19–32)
Calcium: 9.3 mg/dL (ref 8.4–10.5)
Chloride: 107 mEq/L (ref 96–112)
Creatinine, Ser: 0.7 mg/dL (ref 0.40–1.20)
GFR: 95.48 mL/min (ref >60.00)
Glucose, Bld: 83 mg/dL (ref 70–99)
Potassium: 3.6 mEq/L (ref 3.5–5.1)
Sodium: 142 mEq/L (ref 135–145)
Total Bilirubin: 0.5 mg/dL (ref 0.2–1.2)
Total Protein: 6.8 g/dL (ref 6.0–8.3)

## 2022-09-16 LAB — MAGNESIUM: Magnesium: 2 mg/dL (ref 1.5–2.5)

## 2022-09-16 LAB — HEMOGLOBIN A1C: Hgb A1c MFr Bld: 5.4 % (ref 4.6–6.5)

## 2022-09-16 LAB — VITAMIN B12: Vitamin B-12: 421 pg/mL (ref 211–911)

## 2022-09-16 LAB — TSH: TSH: 2.6 u[IU]/mL (ref 0.35–5.50)

## 2022-09-16 NOTE — Progress Notes (Signed)
Stephanie Castro is a 57 y.o. female with the following history as recorded in Epic[employer]:  Patient Active Problem List   Diagnosis Date Noted   Postoperative seroma of musculoskeletal structure after musculoskeletal procedure 07/10/2021   Localized swelling of both lower extremities 06/05/2021   Snoring 05/27/2021   Insomnia 05/27/2021   Left hip pain 12/11/2020   Obesity (BMI 35.0-39.9 without comorbidity) 11/30/2020   Class 2 severe obesity due to excess calories with serious comorbidity and body mass index (BMI) of 35.0 to 35.9 in adult (HCC) 09/14/2020   Greater trochanteric bursitis of left hip 08/30/2020   Left knee pain 08/30/2020   Elevated uric acid in blood 05/02/2020   Chronic pain of right knee 04/13/2020   Mixed dyslipidemia 02/22/2020   Arthritis    Dysplastic nevus    Hypertension    Palpitations 10/19/2019   Cardiac murmur 10/19/2019   Lateral epicondylitis of both elbows 08/06/2018   Concern about female breast disease without diagnosis 03/17/2018   Essential hypertension 02/26/2017   Exercise-induced asthma    DOE (dyspnea on exertion) 08/08/2015   Elevated BP without diagnosis of hypertension 06/15/2015   Weight gain 04/24/2015   Primary localized osteoarthritis of right knee 10/13/2014   Tear of medial meniscus of right knee 10/13/2014   Squamous cell carcinoma, face 02/24/2014   Vitamin D deficiency 02/24/2014   Edema of extremities 07/07/2013   Fatigue 07/06/2013   GERD (gastroesophageal reflux disease) 07/06/2013   Myalgia 07/06/2013   Vitamin B12 deficiency 05/15/2013   BMI 35.0-35.9,adult 05/15/2013   Perimenopausal 05/15/2013   Low TSH level 04/22/2013   Breast cancer screening, high risk patient 04/04/2013   Preventative health care 04/04/2013   Periodic edema 04/04/2013   Other and unspecified hyperlipidemia 04/06/2011    Current Outpatient Medications  Medication Sig Dispense Refill   albuterol (VENTOLIN HFA) 108 (90 Base) MCG/ACT inhaler  Inhale 1-2 puffs into the lungs every 6 (six) hours as needed (shortness of breath). 6.7 g 1   cetirizine (ZYRTEC) 10 MG tablet Take 1 tablet (10 mg total) by mouth daily. 100 tablet 2   cyanocobalamin (VITAMIN B12) 1000 MCG/ML injection Inject 1 mL (1,000 mcg total) into the skin every 14 (fourteen) days. 10 mL 0   fluticasone (FLONASE) 50 MCG/ACT nasal spray Place 2 sprays into both nostrils daily. 16 g 6   furosemide (LASIX) 40 MG tablet Take 1 tablet (40 mg total) by mouth daily. 30 tablet 3   ibuprofen (ADVIL) 800 MG tablet Take 1 tablet (800 mg total) by mouth every 6 (six) hours as needed for mild pain. 20 tablet 0   potassium chloride (KLOR-CON M) 10 MEQ tablet Take 2 tablets (20 mEq total) by mouth with every dose of lasix. 60 tablet 2   sodium chloride (OCEAN) 0.65 % SOLN nasal spray Place 1 spray into both nostrils as needed for congestion. 44 mL 3   Vitamin D, Ergocalciferol, (DRISDOL) 1.25 MG (50000 UNIT) CAPS capsule Take 1 capsule (50,000 Units total) by mouth every 7 (seven) days. 12 capsule 0   rosuvastatin (CRESTOR) 10 MG tablet Take 1 tablet (10 mg total) by mouth daily. *Patient needs an appointment for further refills. 2nd attempt.* (Patient not taking: Reported on 09/16/2022) 15 tablet 0   No current facility-administered medications for this visit.    Allergies: Penicillins  Past Medical History:  Diagnosis Date   Arthritis    both knees , back and elbows   BMI 35.0-35.9,adult 05/15/2013   Breast cancer  screening, high risk patient 04/04/2013   Pt states she had mmg 2015 nml results, ordered by Dr  Riley Nearing, [facility] health care. Results are not in Care everywhere. Mammogram completed 06/12/2015 at [facility] in [facility], [place] Category 1    Cardiac murmur 10/19/2019   Chronic pain of right knee 04/13/2020   Class 2 severe obesity due to excess calories with serious comorbidity and body mass index (BMI)  of 35.0 to 35.9 in adult [facility]) 09/14/2020   Concern about female breast disease without diagnosis 03/17/2018   DOE (dyspnea on exertion) 08/08/2015   Dysplastic nevus    beneath r eye   Edema of extremities 07/07/2013   Last Assessment & Plan:  Pt takes HCTZ daily.   Last Assessment & Plan:  Formatting of this note might be different from the original. Pt takes HCTZ daily.   Elevated BP without diagnosis of hypertension 06/15/2015   Elevated uric acid in blood 05/02/2020   Essential hypertension 02/26/2017   Exercise-induced asthma    Fatigue 07/06/2013   Last Assessment & Plan:  Will review previous records from previous PCP, pt had thyroid done recently and was wnl. Pt had questions re lupus/MS and how to get tested for these.   Last Assessment & Plan:  Formatting of this note might be different from the original. Will review previous records from previous PCP, pt had thyroid done recently and was wnl. Pt had questions re lupus/MS and how to get    GERD (gastroesophageal reflux disease)    Greater trochanteric bursitis of left hip 08/30/2020   Injection given August 30, 2020   Hypertension    Lateral epicondylitis of both elbows 08/06/2018   Left knee pain 08/30/2020   Low TSH level 04/22/2013   Mixed dyslipidemia 02/22/2020   Myalgia 07/06/2013   Last Assessment & Plan:  Formatting of this note might be different from the original. Will check labs today including sed rate, ANA/RF.  Last Assessment & Plan:  Formatting of this note might be different from the original. Pt would like to be tested for lupus in the future. Has been tested for RA and was negative   Other and unspecified hyperlipidemia 04/06/2011   Palpitations 10/19/2019   Perimenopausal 05/15/2013   Periodic edema 04/04/2013   Pt reports taking "water pill" for many years due to feeling "swollen". Historically, took HCTZ but became hypokalemic. She was switched to triamterene-HCTZ with no more hypokalemic episodes.     Preventative health care  04/04/2013   Primary localized osteoarthritis of right knee 10/13/2014   Squamous cell carcinoma, face 02/24/2014   Tear of medial meniscus of right knee 10/13/2014   Vitamin B12 deficiency 05/15/2013   Last Assessment & Plan:  Formatting of this note might be different from the original. Recheck level today, may need to restart monthly Vit B12 shots   Vitamin D deficiency 02/24/2014   Weight gain 04/24/2015    Past Surgical History:  Procedure Laterality Date   ABDOMINAL HYSTERECTOMY  2007   KNEE ARTHROSCOPY WITH MEDIAL MENISECTOMY Right 10/13/2014   Procedure: RIGHT KNEE ARTHROSCOPY WITH PARTIAL MEDIAL MENISECTOMY AND CHONDROPLASTY ;  Surgeon: Teryl Lucy, MD;  Location: [facility];  Service: Orthopedics;  Laterality: Right;   TOTAL HIP ARTHROPLASTY Left 03/29/2021    Family History  Problem Relation Age of Onset   Colon polyps Mother    Hyperlipidemia Mother    Breast cancer Mother 55  breast   Heart attack Father    COPD Father    Colon polyps Sister    Heart attack Sister    Hyperlipidemia Sister    Hypertension Sister    Heart attack Sister    Hyperlipidemia Maternal Aunt    Breast cancer Maternal Aunt        breast   Hypertension Maternal Uncle    Diabetes Maternal Uncle    Colon cancer Maternal Uncle        colon   Hyperlipidemia Maternal Grandmother    Diabetes Maternal Grandmother    Breast cancer Maternal Grandmother        breast   Diabetes Maternal Grandfather    Sudden death Neg Hx    Esophageal cancer Neg Hx    Rectal cancer Neg Hx    Stomach cancer Neg Hx     Social History   Tobacco Use   Smoking status: Former    Current packs/day: 0.00    Average packs/day: 1 pack/day for 20.0 years (20.0 ttl pk-yrs)    Types: Cigarettes    Start date: 79    Quit date: 2005    Years since quitting: 19.6    Passive exposure: Never   Smokeless tobacco: Never  Substance Use Topics   Alcohol use: Yes    Alcohol/week: 2.0 standard drinks of  alcohol    Types: 1 Glasses of wine, 1 Shots of liquor per week    Comment: occ    Subjective:   Presents with concerns for dizziness/ light-headedness; denies sensation of room spinning or chest palpitations- "just feel off";  increased thirst in the past 2 weeks; concerned about her blood sugar; also has lost 7 pounds since February 2024- ? If blood pressure is too low? Would like to get labs updated today;  Only taking Lasix as needed for swelling- did not take today;   Objective:  Vitals:   09/16/22 0837  BP: 108/60  Pulse: (!) 59  Resp: 18  Temp: 97.8 F (36.6 C)  TempSrc: Temporal  SpO2: 98%  Weight: 187 lb (84.8 kg)  Height: 5' 1.5" (1.562 m)    General: Well developed, well nourished, in no acute distress  Skin : Warm and dry.  Head: Normocephalic and atraumatic  Eyes: Sclera and conjunctiva clear; pupils round and reactive to light; extraocular movements intact  Ears: External normal; canals clear; tympanic membranes normal  Oropharynx: Pink, supple. No suspicious lesions  Neck: Supple without thyromegaly, adenopathy  Lungs: Respirations unlabored; clear to auscultation bilaterally without wheeze, rales, rhonchi  CVS exam: normal rate and regular rhythm.  Abdomen: Soft; nontender; nondistended; normoactive bowel sounds; no masses or hepatosplenomegaly  Musculoskeletal: No deformities; no active joint inflammation  Extremities: No edema, cyanosis, clubbing  Vessels: Symmetric bilaterally  Neurologic: Alert and oriented; speech intact; face symmetrical; moves all extremities well; CNII-XII intact without focal deficit   Assessment:  1. Dizziness   2. Elevated glucose   3. Low magnesium level   4. Low vitamin B12 level     Plan:  EKG shows sinus rhythm/ pulse is low at 59 in office; physical exam is reassuring; she did not take her Lasix today; will update labs today and follow up to be determined based on lab results.   No follow-ups on file.  Orders Placed This  Encounter  Procedures   CBC with Differential/Platelet   Comp Met (CMET)   Hemoglobin A1c   TSH   Magnesium   B12   EKG 12-Lead  Requested Prescriptions    No prescriptions requested or ordered in this encounter

## 2022-09-17 ENCOUNTER — Other Ambulatory Visit: Payer: Self-pay | Admitting: Family

## 2022-09-17 DIAGNOSIS — R42 Dizziness and giddiness: Secondary | ICD-10-CM

## 2022-09-18 ENCOUNTER — Other Ambulatory Visit (HOSPITAL_BASED_OUTPATIENT_CLINIC_OR_DEPARTMENT_OTHER): Payer: Self-pay

## 2022-09-18 DIAGNOSIS — F4310 Post-traumatic stress disorder, unspecified: Secondary | ICD-10-CM | POA: Diagnosis not present

## 2022-09-18 MED ORDER — CLINDAMYCIN HCL 300 MG PO CAPS
300.0000 mg | ORAL_CAPSULE | Freq: Three times a day (TID) | ORAL | 0 refills | Status: DC
  Filled 2022-09-18: qty 15, 5d supply, fill #0

## 2022-09-18 MED ORDER — IBUPROFEN 800 MG PO TABS
800.0000 mg | ORAL_TABLET | Freq: Four times a day (QID) | ORAL | 0 refills | Status: DC | PRN
  Filled 2022-09-18: qty 20, 5d supply, fill #0

## 2022-09-21 ENCOUNTER — Other Ambulatory Visit: Payer: Self-pay

## 2022-09-21 ENCOUNTER — Emergency Department (HOSPITAL_BASED_OUTPATIENT_CLINIC_OR_DEPARTMENT_OTHER)
Admission: EM | Admit: 2022-09-21 | Discharge: 2022-09-21 | Disposition: A | Discharge status: Home / Self Care | Payer: BC Managed Care – PPO | Attending: Emergency Medicine | Admitting: Emergency Medicine

## 2022-09-21 ENCOUNTER — Emergency Department (HOSPITAL_BASED_OUTPATIENT_CLINIC_OR_DEPARTMENT_OTHER): Payer: BC Managed Care – PPO

## 2022-09-21 ENCOUNTER — Encounter (HOSPITAL_BASED_OUTPATIENT_CLINIC_OR_DEPARTMENT_OTHER): Payer: Self-pay

## 2022-09-21 DIAGNOSIS — K760 Fatty (change of) liver, not elsewhere classified: Secondary | ICD-10-CM | POA: Diagnosis not present

## 2022-09-21 DIAGNOSIS — R1011 Right upper quadrant pain: Secondary | ICD-10-CM | POA: Insufficient documentation

## 2022-09-21 DIAGNOSIS — R42 Dizziness and giddiness: Secondary | ICD-10-CM | POA: Diagnosis not present

## 2022-09-21 DIAGNOSIS — R1010 Upper abdominal pain, unspecified: Secondary | ICD-10-CM

## 2022-09-21 DIAGNOSIS — R1012 Left upper quadrant pain: Secondary | ICD-10-CM | POA: Insufficient documentation

## 2022-09-21 DIAGNOSIS — Z79899 Other long term (current) drug therapy: Secondary | ICD-10-CM | POA: Insufficient documentation

## 2022-09-21 DIAGNOSIS — R079 Chest pain, unspecified: Secondary | ICD-10-CM | POA: Diagnosis not present

## 2022-09-21 DIAGNOSIS — I160 Hypertensive urgency: Secondary | ICD-10-CM | POA: Diagnosis not present

## 2022-09-21 DIAGNOSIS — R001 Bradycardia, unspecified: Secondary | ICD-10-CM | POA: Diagnosis not present

## 2022-09-21 DIAGNOSIS — I2510 Atherosclerotic heart disease of native coronary artery without angina pectoris: Secondary | ICD-10-CM | POA: Diagnosis not present

## 2022-09-21 DIAGNOSIS — R0789 Other chest pain: Secondary | ICD-10-CM | POA: Diagnosis not present

## 2022-09-21 LAB — CBC
HCT: 39.5 % (ref 36.0–46.0)
Hemoglobin: 13.1 g/dL (ref 12.0–15.0)
MCH: 29.4 pg (ref 26.0–34.0)
MCHC: 33.2 g/dL (ref 30.0–36.0)
MCV: 88.6 fL (ref 80.0–100.0)
Platelets: 198 10*3/uL (ref 150–400)
RBC: 4.46 MIL/uL (ref 3.87–5.11)
RDW: 12.9 % (ref 11.5–15.5)
WBC: 5.2 10*3/uL (ref 4.0–10.5)
nRBC: 0 % (ref 0.0–0.2)

## 2022-09-21 LAB — TROPONIN I (HIGH SENSITIVITY)
Troponin I (High Sensitivity): 3 ng/L (ref <18)
Troponin I (High Sensitivity): 3 ng/L (ref <18)

## 2022-09-21 LAB — BASIC METABOLIC PANEL
Anion gap: 11 (ref 5–15)
BUN: 14 mg/dL (ref 6–20)
CO2: 23 mmol/L (ref 22–32)
Calcium: 9.3 mg/dL (ref 8.9–10.3)
Chloride: 105 mmol/L (ref 98–111)
Creatinine, Ser: 0.77 mg/dL (ref 0.44–1.00)
GFR, Estimated: >60 mL/min (ref >60)
Glucose, Bld: 93 mg/dL (ref 70–99)
Potassium: 3.5 mmol/L (ref 3.5–5.1)
Sodium: 139 mmol/L (ref 135–145)

## 2022-09-21 LAB — HEPATIC FUNCTION PANEL
ALT: 19 U/L (ref 0–44)
AST: 35 U/L (ref 15–41)
Albumin: 4.2 g/dL (ref 3.5–5.0)
Alkaline Phosphatase: 83 U/L (ref 38–126)
Bilirubin, Direct: 0.1 mg/dL (ref 0.0–0.2)
Indirect Bilirubin: 0.7 mg/dL (ref 0.3–0.9)
Total Bilirubin: 0.8 mg/dL (ref 0.3–1.2)
Total Protein: 7.2 g/dL (ref 6.5–8.1)

## 2022-09-21 LAB — PROTIME-INR
INR: 0.9 (ref 0.8–1.2)
Prothrombin Time: 11.8 seconds (ref 11.4–15.2)

## 2022-09-21 LAB — LIPASE, BLOOD: Lipase: 37 U/L (ref 11–51)

## 2022-09-21 MED ORDER — PANTOPRAZOLE SODIUM 40 MG IV SOLR
40.0000 mg | Freq: Once | INTRAVENOUS | Status: AC
  Administered 2022-09-21: 40 mg via INTRAVENOUS
  Filled 2022-09-21: qty 10

## 2022-09-21 MED ORDER — IOHEXOL 350 MG/ML SOLN
100.0000 mL | Freq: Once | INTRAVENOUS | Status: AC | PRN
  Administered 2022-09-21: 100 mL via INTRAVENOUS

## 2022-09-21 MED ORDER — ALUM & MAG HYDROXIDE-SIMETH 200-200-20 MG/5ML PO SUSP
30.0000 mL | Freq: Once | ORAL | Status: AC
  Administered 2022-09-21: 30 mL via ORAL
  Filled 2022-09-21: qty 30

## 2022-09-21 NOTE — Discharge Instructions (Signed)
You can take over-the-counter meds such as Prilosec or Pepcid to help with your stomach pain.  Log your blood pressures once in the morning and once in the evening and take this to your family doctor to investigate possible high blood pressure that we will need treatment.  If you develop new or worsening chest pain, abdominal pain, trouble breathing, dizziness, lightheadedness, or any other new/concerning symptoms then return to the ER or call 911.

## 2022-09-21 NOTE — ED Provider Notes (Signed)
[facility] Provider Note   CSN: [id number] Arrival date & time: 09/21/22  1628     History  Chief Complaint  Patient presents with   Chest Pain    Stephanie Castro is a 57 y.o. female.  HPI 57 year old female presents with chest tightness and upper abdominal pain.  Symptoms started about an hour prior to arrival.  She notes some on and off dizziness and shortness of breath that only last for few seconds over the past week or so.  She is also currently being treated for a dental infection which she thinks is improving.  However over the last hour or so she developed sudden chest tightness and upper abdominal pain.  It is much better than it was earlier and about a 3 out of 10.  She checked her blood pressure and it was over 200 systolic which got her concerned as normally her blood pressures in the 130s and was even in the low 100s a few days ago at her PCP office.  No fevers, vomiting, back pain, urinary symptoms, diarrhea.  Has not tried anything for the pain.  Home Medications Prior to Admission medications   Medication Sig Start Date End Date Taking? Authorizing Provider  albuterol (VENTOLIN HFA) 108 (90 Base) MCG/ACT inhaler Inhale 1-2 puffs into the lungs every 6 (six) hours as needed (shortness of breath). 03/07/22   Sharlene Dory, DO  cetirizine (ZYRTEC) 10 MG tablet Take 1 tablet (10 mg total) by mouth daily. 05/21/22   Clayborne Dana, NP  clindamycin (CLEOCIN) 300 MG capsule Take 1 capsule (300 mg total) by mouth every 8 (eight) hours until gone. Take with dairy or probiotics. 09/18/22     cyanocobalamin (VITAMIN B12) 1000 MCG/ML injection Inject 1 mL (1,000 mcg total) into the skin every 14 (fourteen) days. 08/22/22 08/22/23  Sharlene Dory, DO  fluticasone (FLONASE) 50 MCG/ACT nasal spray Place 2 sprays into both nostrils daily. 05/21/22   Clayborne Dana, NP  furosemide (LASIX) 40 MG tablet Take 1 tablet (40 mg total) by  mouth daily. 05/15/22   Sharlene Dory, DO  ibuprofen (ADVIL) 800 MG tablet Take 1 tablet (800 mg total) by mouth every 6 (six) hours as needed for mild pain. 06/19/22     ibuprofen (ADVIL) 800 MG tablet Take 1 tablet (800 mg total) by mouth every 6 (six) hours as needed for mild pain. 09/18/22     potassium chloride (KLOR-CON M) 10 MEQ tablet Take 2 tablets (20 mEq total) by mouth with every dose of lasix. 01/15/22   Sharlene Dory, DO  rosuvastatin (CRESTOR) 10 MG tablet Take 1 tablet (10 mg total) by mouth daily. *Patient needs an appointment for further refills. 2nd attempt.* Patient not taking: Reported on 09/16/2022 07/03/22   Revankar, Aundra Dubin, MD  sodium chloride (OCEAN) 0.65 % SOLN nasal spray Place 1 spray into both nostrils as needed for congestion. 05/21/22   Clayborne Dana, NP  Vitamin D, Ergocalciferol, (DRISDOL) 1.25 MG (50000 UNIT) CAPS capsule Take 1 capsule (50,000 Units total) by mouth every 7 (seven) days. 03/04/22   Sharlene Dory, DO      Allergies    Penicillins    Review of Systems   Review of Systems  Constitutional:  Negative for fever.  Respiratory:  Positive for chest tightness and shortness of breath.   Cardiovascular:  Negative for chest pain.  Gastrointestinal:  Positive for abdominal pain. Negative for  diarrhea and vomiting.  Genitourinary:  Negative for dysuria.  Musculoskeletal:  Negative for back pain.    Physical Exam Updated Vital Signs BP (!) 158/70   Pulse (!) 45   Temp 97.9 F (36.6 C)   Resp 11   Ht 5' 1.5" (1.562 m)   Wt 84.8 kg   SpO2 100%   BMI 34.76 kg/m  Physical Exam Vitals and nursing note reviewed.  Constitutional:      Appearance: She is well-developed.  HENT:     Head: Normocephalic and atraumatic.  Cardiovascular:     Rate and Rhythm: Normal rate and regular rhythm.     Heart sounds: Normal heart sounds.  Pulmonary:     Effort: Pulmonary effort is normal.     Breath sounds: Normal breath sounds.   Abdominal:     Palpations: Abdomen is soft.     Tenderness: There is abdominal tenderness (mostly LUQ tenderness) in the right upper quadrant and left upper quadrant.  Skin:    General: Skin is warm and dry.  Neurological:     Mental Status: She is alert.     ED Results / Procedures / Treatments   Labs (all labs ordered are listed, but only abnormal results are displayed) Labs Reviewed  BASIC METABOLIC PANEL  CBC  PROTIME-INR  HEPATIC FUNCTION PANEL  LIPASE, BLOOD  TROPONIN I (HIGH SENSITIVITY)  TROPONIN I (HIGH SENSITIVITY)    EKG EKG Interpretation Date/Time:  Sunday September 21 2022 16:41:43 EDT Ventricular Rate:  53 PR Interval:  168 QRS Duration:  82 QT Interval:  464 QTC Calculation: 435 R Axis:   32  Text Interpretation: Sinus bradycardia Nonspecific T wave abnormality rate is slower, othrewise ST/T changes similar to 2018 Confirmed by Pricilla Loveless [phone]) on 09/21/2022 5:33:50 PM  Radiology CT Angio Chest/Abd/Pel for Dissection W and/or Wo Contrast  Result Date: 09/21/2022 CLINICAL DATA:  Chest tightness, dizziness and pressure in upper back and upper abdomen for a week. Acute hypertension some shortness of breath. EXAM: CT ANGIOGRAPHY CHEST, ABDOMEN AND PELVIS TECHNIQUE: Non-contrast CT of the chest was initially obtained. Multidetector CT imaging through the chest, abdomen and pelvis was performed using the standard protocol during bolus administration of intravenous contrast. Multiplanar reconstructed images and MIPs were obtained and reviewed to evaluate the vascular anatomy. RADIATION DOSE REDUCTION: This exam was performed according to the departmental dose-optimization program which includes automated exposure control, adjustment of the mA and/or kV according to patient size and/or use of iterative reconstruction technique. CONTRAST:  OMNIPAQUE IOHEXOL 350 MG/ML SOLN COMPARISON:  CT angiogram chest 09/13/2021 pain FINDINGS: CTA CHEST FINDINGS  Cardiovascular: No abnormal high density along the course of the thoracic aorta on the precontrast dataset. There is some mild noncalcified plaque along the descending thoracic aorta. Pulsation artifact along the ascending aorta. No dissection or aneurysm formation. Preserved origins of the great vessels. Diameter of the ascending aorta at the level of the right pulmonary artery measures 3.4 by 3.4 cm. The descending thoracic aorta same level measures 2.5 by 2.3 cm. Ascending aorta has a diameter of 2.6 cm just above the aortic root. Diameter of the aortic arch just distal to the left subclavian origin measures 2.4 cm. Coronary artery calcifications are seen. Mediastinum/Nodes: Normal caliber thoracic esophagus. Preserved thyroid gland. No specific abnormal lymph node enlargement identified in the axillary regions, hilum or mediastinum. Lungs/Pleura: There is some linear opacity lung bases likely scar or atelectasis. No pleural effusion no consolidation, pneumothorax or effusion. Musculoskeletal: No chest  wall abnormality. No acute or significant osseous findings. Review of the MIP images confirms the above findings. CTA ABDOMEN AND PELVIS FINDINGS VASCULAR Aorta: Minimal atherosclerotic plaque/intimal thickening. No dissection or aneurysm formation. Celiac: No significant stenosis. There is variant of a separate origin of the left gastric artery from the aorta SMA: Patent without evidence of aneurysm, dissection, vasculitis or significant stenosis. Renals: Both renal arteries are patent without evidence of aneurysm, dissection, vasculitis, fibromuscular dysplasia or significant stenosis. IMA: Patent without evidence of aneurysm, dissection, vasculitis or significant stenosis. Inflow: Patent without evidence of aneurysm, dissection, vasculitis or significant stenosis. Veins: No obvious venous abnormality within the limitations of this arterial phase study. Review of the MIP images confirms the above findings.  NON-VASCULAR Hepatobiliary: With the limits of the early phase of the arterial bolus, grossly no space-occupying liver lesion. Fatty infiltration. Gallbladder is nondilated. Pancreas: Unremarkable. No pancreatic ductal dilatation or surrounding inflammatory changes. Spleen: Normal in size without focal abnormality.  Small splenule. Adrenals/Urinary Tract: Preserved adrenal glands. No enhancing renal mass or collecting system dilatation. There is contrast in the renal collecting systems. Please correlate for previous contrast administration or other process. There are some atrophy along the upper aspect of the right kidney. Some tiny low-attenuation lesion seen along each kidney, too small to completely characterize but likely benign cysts. No specific imaging follow-up. Preserved contours of the urinary bladder. Stomach/Bowel: On this non oral contrast exam, bowel is nondilated. This includes the stomach, small and large bowel. Scattered colonic stool. There are some small bowel stool appearance seen along the distal small bowel, nonspecific normal appendix extends caudal to the cecum in the right hemipelvis. Lymphatic: No specific abnormal lymph node enlargement identified in the abdomen and pelvis. Reproductive: Status post hysterectomy. No adnexal masses. Other: No free air or free fluid. Musculoskeletal: Scattered degenerative changes of the spine and pelvis. There is significant streak artifact related to the patient's left hip arthroplasty the. This obscures surrounding soft tissue structures. There is some subcutaneous fat stranding anterior to the arthroplasty as well. Review of the MIP images confirms the above findings. IMPRESSION: Slight atherosclerotic changes. No dissection or aneurysm formation. No consolidation, pneumothorax or effusion. Nonspecific bowel gas pattern. Scattered colonic stool. Normal appendix. Mild fatty liver infiltration. Left hip arthroplasty Electronically Signed   By: Karen Kays  M.D.   On: 09/21/2022 20:11   DG Chest 2 View  Result Date: 09/21/2022 CLINICAL DATA:  Chest pain with radiation to back beginning yesterday. EXAM: CHEST - 2 VIEW COMPARISON:  09/13/2021 FINDINGS: The heart size and mediastinal contours are within normal limits. Both lungs are clear. The visualized skeletal structures are unremarkable. IMPRESSION: No active cardiopulmonary disease. Electronically Signed   By: Danae Orleans M.D.   On: 09/21/2022 17:38    Procedures Procedures    Medications Ordered in ED Medications  iohexol (OMNIPAQUE) 350 MG/ML injection 100 mL (100 mLs Intravenous Contrast Given 09/21/22 1852)  pantoprazole (PROTONIX) injection 40 mg (40 mg Intravenous Given 09/21/22 2041)  alum & mag hydroxide-simeth (MAALOX/MYLANTA) 200-200-20 MG/5ML suspension 30 mL (30 mLs Oral Given 09/21/22 2041)    ED Course/ Medical Decision Making/ A&P                                 Medical Decision Making Amount and/or Complexity of Data Reviewed Labs: ordered.    Details: Normal labs including normal troponins x 2. Radiology: ordered and independent interpretation performed.  Details: No aortic dissection ECG/medicine tests: ordered and independent interpretation performed.    Details: Nonspecific changes but unchanged from prior  Risk OTC drugs. Prescription drug management.   Patient presents with chest tightness and upper abdominal discomfort.  She is also pretty hypertensive though this gradually improved without significant treatment in the ED.  She initially declined all pain medicine.  Workup shows no signs of MI and her dissection study due to her hypertension and symptoms was negative.  No pancreatitis.  Perhaps this is gastric related though she states she has not been using much NSAIDs recently.  Either way she is feeling better and was given some GI cocktail and Protonix.  Blood pressure is slowly come down to the 150s.  Chart review shows that just a few days ago her blood  pressure was in the low 100s so I do not think emergent blood pressure management is needed I discussed that she should continue to check her blood pressure at home as it is already decreasing and follow-up with PCP.  Otherwise, her heart rate has occasionally been in the 40s which it has been in her chart as well and her Fitbit recordings shows that she is often having a resting heart rate in the 40s.  This does not appear to be acutely symptomatic at this point I discussed if she is concerned she can also follow-up with her PCP for possible outpatient Holter monitoring.  She is not currently dizzy or having near syncope.  Will discharge home with return precautions.        Final Clinical Impression(s) / ED Diagnoses Final diagnoses:  Upper abdominal pain  Hypertensive urgency    Rx / DC Orders ED Discharge Orders     None         Pricilla Loveless, MD 09/21/22 2253

## 2022-09-21 NOTE — ED Triage Notes (Signed)
The patient has been having chest tightness and upper abd pain for one one. Then when she took her blood pressure was 180/90. She is also having some shortness of breath.

## 2022-09-30 ENCOUNTER — Ambulatory Visit: Payer: BC Managed Care – PPO | Admitting: Family Medicine

## 2022-09-30 ENCOUNTER — Encounter: Payer: Self-pay | Admitting: Family Medicine

## 2022-09-30 VITALS — BP 124/76 | HR 51 | Temp 98.1°F | Ht 61.5 in | Wt 188.1 lb

## 2022-09-30 DIAGNOSIS — R42 Dizziness and giddiness: Secondary | ICD-10-CM | POA: Diagnosis not present

## 2022-09-30 DIAGNOSIS — H6122 Impacted cerumen, left ear: Secondary | ICD-10-CM | POA: Diagnosis not present

## 2022-09-30 NOTE — Progress Notes (Signed)
Chief Complaint  Patient presents with   Follow-up    Emergency Department    Stephanie Castro is 57 y.o. pt here for dizziness.  Duration: 2 weeks Pass out? No Spinning? Yes Recent illness/fever? No Headache? Posterior headache Neurologic signs? No Change in PO intake? Yes Palpitations? Not a/w this No Cp or SOB currently.   Past Medical History:  Diagnosis Date   Arthritis    both knees , back and elbows   BMI 35.0-35.9,adult 05/15/2013   Breast cancer screening, high risk patient 04/04/2013   Pt states she had mmg 2015 nml results, ordered by Dr  Riley Nearing, [facility] health care. Results are not in Care everywhere. Mammogram completed 06/12/2015 at [facility] in [facility] [place], [place] Category 1    Cardiac murmur 10/19/2019   Chronic pain of right knee 04/13/2020   Class 2 severe obesity due to excess calories with serious comorbidity and body mass index (BMI) of 35.0 to 35.9 in adult [facility]) 09/14/2020   Concern about female breast disease without diagnosis 03/17/2018   DOE (dyspnea on exertion) 08/08/2015   Dysplastic nevus    beneath r eye   Edema of extremities 07/07/2013   Last Assessment & Plan:  Pt takes HCTZ daily.   Last Assessment & Plan:  Formatting of this note might be different from the original. Pt takes HCTZ daily.   Elevated BP without diagnosis of hypertension 06/15/2015   Elevated uric acid in blood 05/02/2020   Essential hypertension 02/26/2017   Exercise-induced asthma    Fatigue 07/06/2013   Last Assessment & Plan:  Will review previous records from previous PCP, pt had thyroid done recently and was wnl. Pt had questions re lupus/MS and how to get tested for these.   Last Assessment & Plan:  Formatting of this note might be different from the original. Will review previous records from previous PCP, pt had thyroid done recently and was wnl. Pt had questions re lupus/MS and how to get    GERD  (gastroesophageal reflux disease)    Greater trochanteric bursitis of left hip 08/30/2020   Injection given August 30, 2020   Hypertension    Lateral epicondylitis of both elbows 08/06/2018   Left knee pain 08/30/2020   Low TSH level 04/22/2013   Mixed dyslipidemia 02/22/2020   Myalgia 07/06/2013   Last Assessment & Plan:  Formatting of this note might be different from the original. Will check labs today including sed rate, ANA/RF.  Last Assessment & Plan:  Formatting of this note might be different from the original. Pt would like to be tested for lupus in the future. Has been tested for RA and was negative   Other and unspecified hyperlipidemia 04/06/2011   Palpitations 10/19/2019   Perimenopausal 05/15/2013   Periodic edema 04/04/2013   Pt reports taking "water pill" for many years due to feeling "swollen". Historically, took HCTZ but became hypokalemic. She was switched to triamterene-HCTZ with no more hypokalemic episodes.     Preventative health care 04/04/2013   Primary localized osteoarthritis of right knee 10/13/2014   Squamous cell carcinoma, face 02/24/2014   Tear of medial meniscus of right knee 10/13/2014   Vitamin B12 deficiency 05/15/2013   Last Assessment & Plan:  Formatting of this note might be different from the original. Recheck level today, may need to restart monthly Vit B12 shots   Vitamin D deficiency 02/24/2014   Weight gain 04/24/2015    Family  History  Problem Relation Age of Onset   Colon polyps Mother    Hyperlipidemia Mother    Breast cancer Mother 90       breast   Heart attack Father    COPD Father    Colon polyps Sister    Heart attack Sister    Hyperlipidemia Sister    Hypertension Sister    Heart attack Sister    Hyperlipidemia Maternal Aunt    Breast cancer Maternal Aunt        breast   Hypertension Maternal Uncle    Diabetes Maternal Uncle    Colon cancer Maternal Uncle        colon   Hyperlipidemia Maternal Grandmother    Diabetes Maternal Grandmother     Breast cancer Maternal Grandmother        breast   Diabetes Maternal Grandfather    Sudden death Neg Hx    Esophageal cancer Neg Hx    Rectal cancer Neg Hx    Stomach cancer Neg Hx     Allergies as of 09/30/2022       Reactions   Penicillins         Medication List        Accurate as of September 30, 2022  8:09 AM. If you have any questions, ask your nurse or doctor.          albuterol 108 (90 Base) MCG/ACT inhaler Commonly known as: Ventolin HFA Inhale 1-2 puffs into the lungs every 6 (six) hours as needed (shortness of breath).   cetirizine 10 MG tablet Commonly known as: ZYRTEC Take 1 tablet (10 mg total) by mouth daily.   clindamycin 300 MG capsule Commonly known as: CLEOCIN Take 1 capsule (300 mg total) by mouth every 8 (eight) hours until gone. Take with dairy or probiotics.   cyanocobalamin 1000 MCG/ML injection Commonly known as: VITAMIN B12 Inject 1 mL (1,000 mcg total) into the skin every 14 (fourteen) days.   Deep Sea Nasal Spray 0.65 % nasal spray Generic drug: sodium chloride Place 1 spray into both nostrils as needed for congestion.   fluticasone 50 MCG/ACT nasal spray Commonly known as: FLONASE Place 2 sprays into both nostrils daily.   furosemide 40 MG tablet Commonly known as: LASIX Take 1 tablet (40 mg total) by mouth daily.   ibuprofen 800 MG tablet Commonly known as: ADVIL Take 1 tablet (800 mg total) by mouth every 6 (six) hours as needed for mild pain. What changed: Another medication with the same name was removed. Continue taking this medication, and follow the directions you see here. Changed by: Sharlene Dory   potassium chloride 10 MEQ tablet Commonly known as: KLOR-CON M Take 2 tablets (20 mEq total) by mouth with every dose of lasix.   rosuvastatin 10 MG tablet Commonly known as: CRESTOR Take 1 tablet (10 mg total) by mouth daily. *Patient needs an appointment for further refills. 2nd attempt.*   Vitamin D  (Ergocalciferol) 1.25 MG (50000 UNIT) Caps capsule Commonly known as: DRISDOL Take 1 capsule (50,000 Units total) by mouth every 7 (seven) days.        BP 124/76 (BP Location: Left Arm, Cuff Size: Normal)   Pulse (!) 51   Temp 98.1 F (36.7 C) (Oral)   Ht 5' 1.5" (1.562 m)   Wt 188 lb 2 oz (85.3 kg)   SpO2 93%   BMI 34.97 kg/m  General: Awake, alert, appears stated age Eyes: PERRLA, EOMi Ears: Patent on R,  90% obstructed on L, TM's neg b/l Heart: Reg rhythm, bradycardic, no murmurs, no carotid bruits Lungs: CTAB, no accessory muscle use MSK: 5/5 strength throughout, gait normal Neuro: No cerebellar signs, patellar reflex 2/4 b/l wo clonus, calcaneal reflex 1/4 b/l wo clonus, biceps reflex 2/4 b/l wo clonus; Dix-Hall-Pike positive on R. Psych: Age appropriate judgment and insight, normal mood and affect  Vertigo  Impacted cerumen of left ear  Epley maneuver. If no improvement soon, will refer to vest rehab. Stay hydrated. Aftercare instructions provided. Flushed today.  F/u prn. Pt voiced understanding and agreement to the plan.  Jilda Roche [place], DO 09/30/22 8:09 AM

## 2022-09-30 NOTE — Progress Notes (Signed)
[name] [address] [place] [address] Phone: [phone] Subjective:   Stephanie Castro, am serving as a scribe for Dr. Antoine Primas.  I'm seeing this patient by the request  of:  Sharlene Dory, DO  CC: Right knee pain  [id number]  08/10/2022 Discussed with patient again  seroma again at this point.  Patient is concerned that this may take some time for multiple aspirations.  We discussed that if she may talk to the orthopedic surgeon that a drain may be able to be placed.  Patient will follow-up with surgeon and discuss treatment options there.  We will send note to surgeon.     Updated 10/07/2022 Stephanie Castro is a 57 y.o. female coming in with complaint of R knee pain. Here for PRP. Patient states that she is going to [place] next year for a large communal walk in October 2025. Will be walking 16-20 miles a day over 2 weeks.      Past Medical History:  Diagnosis Date   Arthritis    both knees , back and elbows   BMI 35.0-35.9,adult 05/15/2013   Breast cancer screening, high risk patient 04/04/2013   Pt states she had mmg 2015 nml results, ordered by Dr  Riley Nearing, [facility] health care. Results are not in Care everywhere. Mammogram completed 06/12/2015 at [facility] [place] [facility] in [facility] [place], [place] Category 1    Cardiac murmur 10/19/2019   Chronic pain of right knee 04/13/2020   Class 2 severe obesity due to excess calories with serious comorbidity and body mass index (BMI) of 35.0 to 35.9 in adult [facility]) 09/14/2020   Concern about female breast disease without diagnosis 03/17/2018   DOE (dyspnea on exertion) 08/08/2015   Dysplastic nevus    beneath r eye   Edema of extremities 07/07/2013   Last Assessment & Plan:  Pt takes HCTZ daily.   Last Assessment & Plan:  Formatting of this note might be different from the original. Pt takes HCTZ daily.   Elevated BP without  diagnosis of hypertension 06/15/2015   Elevated uric acid in blood 05/02/2020   Essential hypertension 02/26/2017   Exercise-induced asthma    Fatigue 07/06/2013   Last Assessment & Plan:  Will review previous records from previous PCP, pt had thyroid done recently and was wnl. Pt had questions re lupus/MS and how to get tested for these.   Last Assessment & Plan:  Formatting of this note might be different from the original. Will review previous records from previous PCP, pt had thyroid done recently and was wnl. Pt had questions re lupus/MS and how to get    GERD (gastroesophageal reflux disease)    Greater trochanteric bursitis of left hip 08/30/2020   Injection given August 30, 2020   Hypertension    Lateral epicondylitis of both elbows 08/06/2018   Left knee pain 08/30/2020   Low TSH level 04/22/2013   Mixed dyslipidemia 02/22/2020   Myalgia 07/06/2013   Last Assessment & Plan:  Formatting of this note might be different from the original. Will check labs today including sed rate, ANA/RF.  Last Assessment & Plan:  Formatting of this note might be different from the original. Pt would like to be tested for lupus in the future. Has been tested for RA and was negative   Other and unspecified hyperlipidemia 04/06/2011   Palpitations 10/19/2019   Perimenopausal 05/15/2013   Periodic edema 04/04/2013  Pt reports taking "water pill" for many years due to feeling "swollen". Historically, took HCTZ but became hypokalemic. She was switched to triamterene-HCTZ with no more hypokalemic episodes.     Preventative health care 04/04/2013   Primary localized osteoarthritis of right knee 10/13/2014   Squamous cell carcinoma, face 02/24/2014   Tear of medial meniscus of right knee 10/13/2014   Vitamin B12 deficiency 05/15/2013   Last Assessment & Plan:  Formatting of this note might be different from the original. Recheck level today, may need to restart monthly Vit B12 shots   Vitamin D deficiency 02/24/2014   Weight gain  04/24/2015   Past Surgical History:  Procedure Laterality Date   ABDOMINAL HYSTERECTOMY  2007   KNEE ARTHROSCOPY WITH MEDIAL MENISECTOMY Right 10/13/2014   Procedure: RIGHT KNEE ARTHROSCOPY WITH PARTIAL MEDIAL MENISECTOMY AND CHONDROPLASTY ;  Surgeon: Teryl Lucy, MD;  Location: [facility];  Service: Orthopedics;  Laterality: Right;   TOTAL HIP ARTHROPLASTY Left 03/29/2021   Social History   Socioeconomic History   Marital status: Married    Spouse name: Not on file   Number of children: Not on file   Years of education: Not on file   Highest education level: Some college, no degree  Occupational History   Not on file  Tobacco Use   Smoking status: Former    Current packs/day: 0.00    Average packs/day: 1 pack/day for 20.0 years (20.0 ttl pk-yrs)    Types: Cigarettes    Start date: 80    Quit date: 2005    Years since quitting: 19.7    Passive exposure: Never   Smokeless tobacco: Never  Vaping Use   Vaping status: Never Used  Substance and Sexual Activity   Alcohol use: Yes    Alcohol/week: 2.0 standard drinks of alcohol    Types: 1 Glasses of wine, 1 Shots of liquor per week    Comment: occ   Drug use: Never   Sexual activity: Yes    Birth control/protection: Surgical  Other Topics Concern   Not on file  Social History Narrative   Married to [place].    Social Determinants of Health   Financial Resource Strain: Low Risk  (09/15/2022)   Overall Financial Resource Strain (CARDIA)    Difficulty of Paying Living Expenses: Not hard at all  Food Insecurity: No Food Insecurity (09/15/2022)   Hunger Vital Sign    Worried About Running Out of Food in the Last Year: Never true    Ran Out of Food in the Last Year: Never true  Transportation Needs: No Transportation Needs (09/15/2022)   PRAPARE - Administrator, Civil Service (Medical): No    Lack of Transportation (Non-Medical): No  Physical Activity: Sufficiently Active (09/15/2022)   Exercise  Vital Sign    Days of Exercise per Week: 4 days    Minutes of Exercise per Session: 50 min  Stress: No Stress Concern Present (09/15/2022)   [employer] of Occupational Health - Occupational Stress Questionnaire    Feeling of Stress : Only a little  Social Connections: Moderately Integrated (09/15/2022)   Social Connection and Isolation Panel [NHANES]    Frequency of Communication with Friends and Family: More than three times a week    Frequency of Social Gatherings with Friends and Family: Once a week    Attends Religious Services: More than 4 times per year    Active Member of [employer] or Organizations: No    Attends [employer]  or Organization Meetings: Not on file    Marital Status: Married   Allergies  Allergen Reactions   Penicillins    Family History  Problem Relation Age of Onset   Colon polyps Mother    Hyperlipidemia Mother    Breast cancer Mother 53       breast   Heart attack Father    COPD Father    Colon polyps Sister    Heart attack Sister    Hyperlipidemia Sister    Hypertension Sister    Heart attack Sister    Hyperlipidemia Maternal Aunt    Breast cancer Maternal Aunt        breast   Hypertension Maternal Uncle    Diabetes Maternal Uncle    Colon cancer Maternal Uncle        colon   Hyperlipidemia Maternal Grandmother    Diabetes Maternal Grandmother    Breast cancer Maternal Grandmother        breast   Diabetes Maternal Grandfather    Sudden death Neg Hx    Esophageal cancer Neg Hx    Rectal cancer Neg Hx    Stomach cancer Neg Hx      Current Outpatient Medications (Cardiovascular):    furosemide (LASIX) 40 MG tablet, Take 1 tablet (40 mg total) by mouth daily.   rosuvastatin (CRESTOR) 10 MG tablet, Take 1 tablet (10 mg total) by mouth daily. *Patient needs an appointment for further refills. 2nd attempt.*  Current Outpatient Medications (Respiratory):    albuterol (VENTOLIN HFA) 108 (90 Base) MCG/ACT inhaler, Inhale 1-2 puffs into the lungs every  6 (six) hours as needed (shortness of breath).   cetirizine (ZYRTEC) 10 MG tablet, Take 1 tablet (10 mg total) by mouth daily.   fluticasone (FLONASE) 50 MCG/ACT nasal spray, Place 2 sprays into both nostrils daily.   sodium chloride (OCEAN) 0.65 % SOLN nasal spray, Place 1 spray into both nostrils as needed for congestion.  Current Outpatient Medications (Analgesics):    ibuprofen (ADVIL) 800 MG tablet, Take 1 tablet (800 mg total) by mouth every 6 (six) hours as needed for mild pain.  Current Outpatient Medications (Hematological):    cyanocobalamin (VITAMIN B12) 1000 MCG/ML injection, Inject 1 mL (1,000 mcg total) into the skin every 14 (fourteen) days.  Current Outpatient Medications (Other):    clindamycin (CLEOCIN) 300 MG capsule, Take 1 capsule (300 mg total) by mouth every 8 (eight) hours until gone. Take with dairy or probiotics.   doxycycline (VIBRA-TABS) 100 MG tablet, Take 1 tablet (100 mg total) by mouth every 12 (twelve) hours.   potassium chloride (KLOR-CON M) 10 MEQ tablet, Take 2 tablets (20 mEq total) by mouth with every dose of lasix.   Vitamin D, Ergocalciferol, (DRISDOL) 1.25 MG (50000 UNIT) CAPS capsule, Take 1 capsule (50,000 Units total) by mouth every 7 (seven) days.    Objective  Blood pressure (!) 140/82, pulse (!) 52, height 5' 1.5" (1.562 m), weight 190 lb (86.2 kg), SpO2 98%.   General: No apparent distress alert and oriented x3 mood and affect normal, dressed appropriately.  HEENT: Pupils equal, extraocular movements intact  Respiratory: Patient's speak in full sentences and does not appear short of breath  Cardiovascular: No lower extremity edema, non tender, no erythema  Patient is ambulatory.  Does have abnormal thigh to calf ratio.  Crepitus of the knee noted.  Instability instability noted with valgus and varus stress.  After informed written and verbal consent, patient was seated on exam table. Right  knee was prepped with alcohol swab and utilizing  anterolateral approach, patient's right knee space was injected with 0.5 cc of 0.5% Marcaine and then injected with 5 cc of leukocyte poor PRP. Patient tolerated the procedure well without immediate complications.    Impression and Recommendations:     The above documentation has been reviewed and is accurate and complete Judi Saa, DO

## 2022-09-30 NOTE — Patient Instructions (Addendum)
Stay hydrated.  OK to use Debrox (peroxide) in the ear to loosen up wax. Also recommend using a bulb syringe (for removing boogers from baby's noses) to flush through warm water and vinegar (3-4:1 ratio). An alternative, though more expensive, is an elephant ear washer wax removal kit. Do not use Q-tips as this can impact wax further.  Send me a message in a few days if not improving. Use YouTube if having trouble understanding these directions.   Let us know if you need anything.

## 2022-10-02 ENCOUNTER — Other Ambulatory Visit (HOSPITAL_BASED_OUTPATIENT_CLINIC_OR_DEPARTMENT_OTHER): Payer: Self-pay

## 2022-10-02 MED ORDER — DOXYCYCLINE HYCLATE 100 MG PO TABS
100.0000 mg | ORAL_TABLET | Freq: Two times a day (BID) | ORAL | 0 refills | Status: DC
  Filled 2022-10-02: qty 10, 5d supply, fill #0

## 2022-10-06 ENCOUNTER — Ambulatory Visit: Payer: BC Managed Care – PPO | Admitting: Family Medicine

## 2022-10-07 ENCOUNTER — Encounter: Payer: Self-pay | Admitting: Family Medicine

## 2022-10-07 ENCOUNTER — Ambulatory Visit (INDEPENDENT_AMBULATORY_CARE_PROVIDER_SITE_OTHER): Payer: Self-pay | Admitting: Family Medicine

## 2022-10-07 VITALS — BP 140/82 | HR 52 | Ht 61.5 in | Wt 190.0 lb

## 2022-10-07 DIAGNOSIS — M1711 Unilateral primary osteoarthritis, right knee: Secondary | ICD-10-CM

## 2022-10-07 DIAGNOSIS — F4310 Post-traumatic stress disorder, unspecified: Secondary | ICD-10-CM | POA: Diagnosis not present

## 2022-10-07 NOTE — Assessment & Plan Note (Signed)
Repeat PRP given today.  Discussed post PRP instructions.  Patient is ambulatory and does have abnormal thigh to calf ratio with instability of the knee so we will get an OA stability brace but the patient is planning in next October to do a long walk.  Patient will do all the other conservative therapy and follow-up with me again 6 months

## 2022-10-07 NOTE — Patient Instructions (Addendum)
No ice or IBU for 3 days Heat and Tylenol are ok Consider another round of 6 months prior to trip And then a steroid before you go on your trip See me again in 6-7 weeks

## 2022-10-15 ENCOUNTER — Ambulatory Visit: Payer: BC Managed Care – PPO | Admitting: Cardiology

## 2022-10-15 DIAGNOSIS — F4310 Post-traumatic stress disorder, unspecified: Secondary | ICD-10-CM | POA: Diagnosis not present

## 2022-10-27 DIAGNOSIS — F4310 Post-traumatic stress disorder, unspecified: Secondary | ICD-10-CM | POA: Diagnosis not present

## 2022-10-29 DIAGNOSIS — M1711 Unilateral primary osteoarthritis, right knee: Secondary | ICD-10-CM | POA: Diagnosis not present

## 2022-10-30 NOTE — Progress Notes (Signed)
Office Visit Note  Patient: Stephanie Castro             Date of Birth: 30-Nov-1965           MRN: [id number]             PCP: Sharlene Dory, DO Referring: Sharlene Dory* Visit Date: 11/13/2022 Occupation: @GUAROCC @  Subjective:  Pain in multiple joints  History of Present Illness: Stephanie Castro is a 57 y.o. female with osteoarthritis.  She states she has been having increased pain and discomfort for the last 9 months.  She states that the pain is bad enough that she cannot sleep at night.  She describes pain and discomfort in her bilateral shoulders, bilateral hands, right knee and both feet.  She has difficulty sleeping on her side due to shoulder pain discomfort.  She continues to have some lower back pain.  She has been having on and off swelling in her right knee joint.  She states she had PRP 6 weeks ago Dr. Katrinka Blazing but has not noticed any improvement yet.  Left total hip replacement is doing well.    Activities of Daily Living:  Patient reports morning stiffness for several hours.   Patient Reports nocturnal pain.  Difficulty dressing/grooming: Denies Difficulty climbing stairs: Reports Difficulty getting out of chair: Reports Difficulty using hands for taps, buttons, cutlery, and/or writing: Reports  Review of Systems  Constitutional:  Positive for fatigue.  HENT:  Negative for mouth sores and mouth dryness.   Eyes:  Negative for dryness.  Respiratory:  Negative for shortness of breath.   Cardiovascular:  Negative for chest pain and palpitations.  Gastrointestinal:  Negative for blood in stool, constipation and diarrhea.  Endocrine: Negative for increased urination.  Genitourinary:  Negative for involuntary urination.  Musculoskeletal:  Positive for joint pain, gait problem, joint pain, joint swelling, myalgias, muscle weakness, morning stiffness, muscle tenderness and myalgias.  Skin:  Negative for color change, rash, hair loss and sensitivity to sunlight.   Allergic/Immunologic: Negative for susceptible to infections.  Neurological:  Positive for dizziness. Negative for headaches.  Hematological:  Negative for swollen glands.  Psychiatric/Behavioral:  Positive for sleep disturbance. Negative for depressed mood. The patient is not nervous/anxious.     PMFS History:  Patient Active Problem List   Diagnosis Date Noted   Postoperative seroma of musculoskeletal structure after musculoskeletal procedure 07/10/2021   Localized swelling of both lower extremities 06/05/2021   Snoring 05/27/2021   Insomnia 05/27/2021   Left hip pain 12/11/2020   Obesity (BMI 35.0-39.9 without comorbidity) 11/30/2020   Class 2 severe obesity due to excess calories with serious comorbidity and body mass index (BMI) of 35.0 to 35.9 in adult (HCC) 09/14/2020   Greater trochanteric bursitis of left hip 08/30/2020   Left knee pain 08/30/2020   Elevated uric acid in blood 05/02/2020   Chronic pain of right knee 04/13/2020   Arthritis    Dysplastic nevus    Hypertension    Palpitations 10/19/2019   Cardiac murmur 10/19/2019   Lateral epicondylitis of both elbows 08/06/2018   Concern about female breast disease without diagnosis 03/17/2018   Essential hypertension 02/26/2017   Exercise-induced asthma    DOE (dyspnea on exertion) 08/08/2015   Elevated BP without diagnosis of hypertension 06/15/2015   Weight gain 04/24/2015   Primary localized osteoarthritis of right knee 10/13/2014   Tear of medial meniscus of right knee 10/13/2014   Squamous cell carcinoma, face 02/24/2014   Vitamin  D deficiency 02/24/2014   Edema of extremities 07/07/2013   Fatigue 07/06/2013   GERD (gastroesophageal reflux disease) 07/06/2013   Myalgia 07/06/2013   Vitamin B12 deficiency 05/15/2013   BMI 35.0-35.9,adult 05/15/2013   Perimenopausal 05/15/2013   Low TSH level 04/22/2013   Breast cancer screening, high risk patient 04/04/2013   Preventative health care 04/04/2013   Periodic  edema 04/04/2013   Other and unspecified hyperlipidemia 04/06/2011    Past Medical History:  Diagnosis Date   Arthritis    both knees , back and elbows   BMI 35.0-35.9,adult 05/15/2013   Breast cancer screening, high risk patient 04/04/2013   Pt states she had mmg 2015 nml results, ordered by Dr  Riley Nearing, [facility] health care. Results are not in Care everywhere. [facility] in [place] [facility] [place], [place] Category 1    Cardiac murmur 10/19/2019   Chronic pain of right knee 04/13/2020   Class 2 severe obesity due to excess calories with serious comorbidity and body mass index (BMI) of 35.0 to 35.9 in adult [facility]) 09/14/2020   Concern about female breast disease without diagnosis 03/17/2018   DOE (dyspnea on exertion) 08/08/2015   Dysplastic nevus    beneath r eye   Edema of extremities 07/07/2013   Last Assessment & Plan:  Pt takes HCTZ daily.   Last Assessment & Plan:  Formatting of this note might be different from the original. Pt takes HCTZ daily.   Elevated BP without diagnosis of hypertension 06/15/2015   Elevated uric acid in blood 05/02/2020   Essential hypertension 02/26/2017   Exercise-induced asthma    Fatigue 07/06/2013   Last Assessment & Plan:  Will review previous records from previous PCP, pt had thyroid done recently and was wnl. Pt had questions re lupus/MS and how to get tested for these.   Last Assessment & Plan:  Formatting of this note might be different from the original. Will review previous records from previous PCP, pt had thyroid done recently and was wnl. Pt had questions re lupus/MS and how to get    GERD (gastroesophageal reflux disease)    Greater trochanteric bursitis of left hip 08/30/2020   Injection given August 30, 2020   Hypertension    Insomnia 05/27/2021   Lateral epicondylitis of both elbows 08/06/2018   Left hip pain 12/11/2020   Left knee pain  08/30/2020   Localized swelling of both lower extremities 06/05/2021   Low TSH level 04/22/2013   Mixed dyslipidemia 02/22/2020   Myalgia 07/06/2013   Last Assessment & Plan:  Formatting of this note might be different from the original. Will check labs today including sed rate, ANA/RF.  Last Assessment & Plan:  Formatting of this note might be different from the original. Pt would like to be tested for lupus in the future. Has been tested for RA and was negative   Obesity (BMI 35.0-39.9 without comorbidity) 11/30/2020   Other and unspecified hyperlipidemia 04/06/2011   Palpitations 10/19/2019   Perimenopausal 05/15/2013   Periodic edema 04/04/2013   Pt reports taking "water pill" for many years due to feeling "swollen". Historically, took HCTZ but became hypokalemic. She was switched to triamterene-HCTZ with no more hypokalemic episodes.     Postoperative seroma of musculoskeletal structure after musculoskeletal procedure 07/10/2021   Left hip anteriorly     Preventative health care 04/04/2013   Primary localized osteoarthritis of right knee 10/13/2014   Snoring 05/27/2021  Squamous cell carcinoma, face 02/24/2014   Tear of medial meniscus of right knee 10/13/2014   Vitamin B12 deficiency 05/15/2013   Last Assessment & Plan:  Formatting of this note might be different from the original. Recheck level today, may need to restart monthly Vit B12 shots   Vitamin D deficiency 02/24/2014   Weight gain 04/24/2015    Family History  Problem Relation Age of Onset   Colon polyps Mother    Hyperlipidemia Mother    Breast cancer Mother 89       breast   Heart attack Father    COPD Father    Colon polyps Sister    Heart attack Sister    Hyperlipidemia Sister    Hypertension Sister    Heart attack Sister    Hyperlipidemia Maternal Aunt    Breast cancer Maternal Aunt        breast   Hypertension Maternal Uncle    Diabetes Maternal Uncle    Colon cancer Maternal Uncle        colon    Hyperlipidemia Maternal Grandmother    Diabetes Maternal Grandmother    Breast cancer Maternal Grandmother        breast   Diabetes Maternal Grandfather    Sudden death Neg Hx    Esophageal cancer Neg Hx    Rectal cancer Neg Hx    Stomach cancer Neg Hx    Past Surgical History:  Procedure Laterality Date   ABDOMINAL HYSTERECTOMY  2007   KNEE ARTHROSCOPY WITH MEDIAL MENISECTOMY Right 10/13/2014   Procedure: RIGHT KNEE ARTHROSCOPY WITH PARTIAL MEDIAL MENISECTOMY AND CHONDROPLASTY ;  Surgeon: Teryl Lucy, MD;  Location: [facility];  Service: Orthopedics;  Laterality: Right;   TOTAL HIP ARTHROPLASTY Left 03/29/2021   Social History   Social History Narrative   Married to Christiana.    Immunization History  Administered Date(s) Administered   Tdap 02/15/2008     Objective: Vital Signs: BP (!) 153/78 (BP Location: Left Arm, Patient Position: Sitting, Cuff Size: Normal)   Pulse (!) 47   Resp 15   Ht 5' 1.5" (1.562 m)   Wt 190 lb (86.2 kg)   BMI 35.32 kg/m    Physical Exam Vitals and nursing note reviewed.  Constitutional:      Appearance: She is well-developed.  HENT:     Head: Normocephalic and atraumatic.  Eyes:     Conjunctiva/sclera: Conjunctivae normal.  Cardiovascular:     Rate and Rhythm: Normal rate and regular rhythm.     Heart sounds: Normal heart sounds.  Pulmonary:     Effort: Pulmonary effort is normal.     Breath sounds: Normal breath sounds.  Abdominal:     General: Bowel sounds are normal.     Palpations: Abdomen is soft.  Musculoskeletal:     Cervical back: Normal range of motion.  Lymphadenopathy:     Cervical: No cervical adenopathy.  Skin:    General: Skin is warm and dry.     Capillary Refill: Capillary refill takes less than 2 seconds.  Neurological:     Mental Status: She is alert and oriented to person, place, and time.  Psychiatric:        Behavior: Behavior normal.      Musculoskeletal Exam: Cervical, thoracic and  lumbar spine 1 good range of motion.  Shoulder joints were in good range of motion but she had discomfort with abduction of bilateral shoulders and tenderness over subacromial region.  Elbow joints and  wrist joints were in good range of motion.  She had bilateral PIP and DIP thickening with no synovitis.  Hip joints were in good range of motion.  Left hip joint is replaced.  Knee joints were in good range of motion.  She has some warmth and swelling in her right knee joint.  There was tenderness over lateral aspect of her right foot but no synovitis was noted.  CDAI Exam: CDAI Score: -- Patient Global: --; Provider Global: -- Swollen: --; Tender: -- Joint Exam 11/13/2022   No joint exam has been documented for this visit   There is currently no information documented on the homunculus. Go to the Rheumatology activity and complete the homunculus joint exam.  Investigation: No additional findings.  Imaging: No results found.  Recent Labs: Lab Results  Component Value Date   WBC 5.2 09/21/2022   HGB 13.1 09/21/2022   PLT 198 09/21/2022   NA 139 09/21/2022   K 3.5 09/21/2022   CL 105 09/21/2022   CO2 23 09/21/2022   GLUCOSE 93 09/21/2022   BUN 14 09/21/2022   CREATININE 0.77 09/21/2022   BILITOT 0.8 09/21/2022   ALKPHOS 83 09/21/2022   AST 35 09/21/2022   ALT 19 09/21/2022   PROT 7.2 09/21/2022   ALBUMIN 4.2 09/21/2022   CALCIUM 9.3 09/21/2022   GFRAA 99 03/07/2020    Speciality Comments: No specialty comments available.  Procedures:  Large Joint Inj: bilateral subacromial bursa on 11/13/2022 8:29 AM Indications: pain Details: 27 G 1.5 in needle, lateral approach  Arthrogram: No  Medications (Right): 1.5 mL lidocaine 1 %; 40 mg triamcinolone acetonide 40 MG/ML Aspirate (Right): 0 mL Medications (Left): 1.5 mL lidocaine 1 %; 40 mg triamcinolone acetonide 40 MG/ML Aspirate (Left): 0 mL Outcome: tolerated well, no immediate complications Procedure, treatment  alternatives, risks and benefits explained, specific risks discussed. Consent was given by the patient. Immediately prior to procedure a time out was called to verify the correct patient, procedure, equipment, support staff and site/side marked as required. Patient was prepped and draped in the usual sterile fashion.     Allergies: Penicillins   Assessment / Plan:     Visit Diagnoses: Primary osteoarthritis of both hands -she continues to have pain and stiffness in her bilateral hands.  No synovitis was noted.  Bilateral PIP and DIP thickening was noted.  X-rays in the past of bilateral hands were consistent with osteoarthritis.  X-rays were reviewed from 2021.  Joint protection muscle strengthening was discussed.  Chronic pain of both shoulders -she is been having severe pain and discomfort in her shoulders over the last few months.  She states the pain is progressively getting worse.  She is having nocturnal pain.  She had tenderness over bilateral subacromial region.  X-rays of bilateral shoulders from 2021 were reviewed which were unremarkable.  As she is having arm discomfort we discussed the option of injecting shoulder joints.  Patient wanted to proceed with the cortisone injection.  After informed consent was obtained bilateral subacromial region was injected with lidocaine and Kenalog as described above.- Plan: Uric acid, Sedimentation rate, Rheumatoid factor, Cyclic citrul peptide antibody, IgG.  We will contact her with the lab results when available.  I also advised her to contact us if her symptoms do not improve.  Status post total hip replacement, left - March 29, 2021 by Dr. Linna Caprice.  She had good range of motion without discomfort.  Primary osteoarthritis of both knees-I reviewed previous x-rays which were consistent  with moderate to severe osteoarthritis.  She had right knee joint PRP injection by Dr. Katrinka Blazing.  She is going for a follow-up appointment with Dr. Katrinka Blazing.  Some warmth and  swelling was noted in the right knee.  Pain in both feet -she has been experiencing pain and discomfort in both feet.  She had tenderness over the lateral aspect of her right foot.  She may benefit from orthotics.  X-rays obtained in the past of the foot was unremarkable except for mild osteoarthritic changes.  Arthropathy of lumbar facet joint-she needs to have some lower back pain.  Core strengthening exercises were discussed.  Positive ANA (antinuclear antibody) - ANA 1: 40NS not a significant titer.  ENA completely negative, complements normal.  She has no clinical features of autoimmune disease.  Other medical problems are listed as follows:  Essential hypertension-blood pressure was elevated at 171/90.  Repeat blood pressure was 153/78.  She was advised to monitor blood pressure closely and follow-up with her PCP.  Patient states she came off the blood pressure medication because her blood pressure was low.  She is on Lasix.  Exercise-induced asthma  History of gastroesophageal reflux (GERD)  Squamous cell carcinoma, face  Vitamin D deficiency  Vitamin B12 deficiency without anemia  Family history of rheumatoid arthritis  Orders: Orders Placed This Encounter  Procedures   Large Joint Inj   Uric acid   Sedimentation rate   Rheumatoid factor   Cyclic citrul peptide antibody, IgG   No orders of the defined types were placed in this encounter.    Follow-Up Instructions: Return in about 6 months (around 05/14/2023) for Osteoarthritis.   Pollyann Savoy, MD  Note - This record has been created using Animal nutritionist.  Chart creation errors have been sought, but may not always  have been located. Such creation errors do not reflect on  the standard of medical care.

## 2022-11-05 DIAGNOSIS — F4310 Post-traumatic stress disorder, unspecified: Secondary | ICD-10-CM | POA: Diagnosis not present

## 2022-11-11 DIAGNOSIS — F4310 Post-traumatic stress disorder, unspecified: Secondary | ICD-10-CM | POA: Diagnosis not present

## 2022-11-12 NOTE — Progress Notes (Unsigned)
[name] [address] [place] [address] Phone: [phone] Subjective:   Stephanie Castro, am serving as a scribe for Dr. Antoine Primas.  I'm seeing this patient by the request  of:  Sharlene Dory, DO  CC:   [id number]  10/07/2022 Repeat PRP given today.  Discussed post PRP instructions.  Patient is ambulatory and does have abnormal thigh to calf ratio with instability of the knee so we will get an OA stability brace but the patient is planning in next October to do a long walk.  Patient will do all the other conservative therapy and follow-up with me again 6 months     Updated 11/13/2022 Stephanie Castro is a 57 y.o. female coming in with complaint of R knee pain. Patient states that she did not get any relief from PRP. Knee feeling warm recently. Had to stop pilates.       Past Medical History:  Diagnosis Date   Arthritis    both knees , back and elbows   BMI 35.0-35.9,adult 05/15/2013   Breast cancer screening, high risk patient 04/04/2013   Pt states she had mmg 2015 nml results, ordered by Dr  Riley Nearing, [facility] health care. Results are not in Care everywhere. [facility] in [facility] [place], [place] Category 1    Cardiac murmur 10/19/2019   Chronic pain of right knee 04/13/2020   Class 2 severe obesity due to excess calories with serious comorbidity and body mass index (BMI) of 35.0 to 35.9 in adult [facility]) 09/14/2020   Concern about female breast disease without diagnosis 03/17/2018   DOE (dyspnea on exertion) 08/08/2015   Dysplastic nevus    beneath r eye   Edema of extremities 07/07/2013   Last Assessment & Plan:  Pt takes HCTZ daily.   Last Assessment & Plan:  Formatting of this note might be different from the original. Pt takes HCTZ daily.   Elevated BP without diagnosis of hypertension 06/15/2015   Elevated uric acid in  blood 05/02/2020   Essential hypertension 02/26/2017   Exercise-induced asthma    Fatigue 07/06/2013   Last Assessment & Plan:  Will review previous records from previous PCP, pt had thyroid done recently and was wnl. Pt had questions re lupus/MS and how to get tested for these.   Last Assessment & Plan:  Formatting of this note might be different from the original. Will review previous records from previous PCP, pt had thyroid done recently and was wnl. Pt had questions re lupus/MS and how to get    GERD (gastroesophageal reflux disease)    Greater trochanteric bursitis of left hip 08/30/2020   Injection given August 30, 2020   Hypertension    Insomnia 05/27/2021   Lateral epicondylitis of both elbows 08/06/2018   Left hip pain 12/11/2020   Left knee pain 08/30/2020   Localized swelling of both lower extremities 06/05/2021   Low TSH level 04/22/2013   Mixed dyslipidemia 02/22/2020   Myalgia 07/06/2013   Last Assessment & Plan:  Formatting of this note might be different from the original. Will check labs today including sed rate, ANA/RF.  Last Assessment & Plan:  Formatting of this note might be different from the original. Pt would like to be tested for lupus in the future. Has been tested for RA and was negative   Obesity (BMI 35.0-39.9 without comorbidity) 11/30/2020   Other and unspecified  hyperlipidemia 04/06/2011   Palpitations 10/19/2019   Perimenopausal 05/15/2013   Periodic edema 04/04/2013   Pt reports taking "water pill" for many years due to feeling "swollen". Historically, took HCTZ but became hypokalemic. She was switched to triamterene-HCTZ with no more hypokalemic episodes.     Postoperative seroma of musculoskeletal structure after musculoskeletal procedure 07/10/2021   Left hip anteriorly     Preventative health care 04/04/2013   Primary localized osteoarthritis of right knee 10/13/2014   Snoring 05/27/2021   Squamous cell carcinoma, face 02/24/2014   Tear of medial  meniscus of right knee 10/13/2014   Vitamin B12 deficiency 05/15/2013   Last Assessment & Plan:  Formatting of this note might be different from the original. Recheck level today, may need to restart monthly Vit B12 shots   Vitamin D deficiency 02/24/2014   Weight gain 04/24/2015   Past Surgical History:  Procedure Laterality Date   ABDOMINAL HYSTERECTOMY  2007   KNEE ARTHROSCOPY WITH MEDIAL MENISECTOMY Right 10/13/2014   Procedure: RIGHT KNEE ARTHROSCOPY WITH PARTIAL MEDIAL MENISECTOMY AND CHONDROPLASTY ;  Surgeon: Teryl Lucy, MD;  Location: [facility];  Service: Orthopedics;  Laterality: Right;   TOTAL HIP ARTHROPLASTY Left 03/29/2021   Social History   Socioeconomic History   Marital status: Married    Spouse name: Not on file   Number of children: Not on file   Years of education: Not on file   Highest education level: Some college, no degree  Occupational History   Not on file  Tobacco Use   Smoking status: Former    Current packs/day: 0.00    Average packs/day: 1 pack/day for 20.0 years (20.0 ttl pk-yrs)    Types: Cigarettes    Start date: 45    Quit date: 2005    Years since quitting: 19.8    Passive exposure: Never   Smokeless tobacco: Never  Vaping Use   Vaping status: Never Used  Substance and Sexual Activity   Alcohol use: Yes    Alcohol/week: 2.0 standard drinks of alcohol    Types: 1 Glasses of wine, 1 Shots of liquor per week    Comment: occ   Drug use: Never   Sexual activity: Yes    Birth control/protection: Surgical  Other Topics Concern   Not on file  Social History Narrative   Married to [place].    Social Determinants of Health   Financial Resource Strain: Low Risk  (09/15/2022)   Overall Financial Resource Strain (CARDIA)    Difficulty of Paying Living Expenses: Not hard at all  Food Insecurity: No Food Insecurity (09/15/2022)   Hunger Vital Sign    Worried About Running Out of Food in the Last Year: Never true    Ran Out of  Food in the Last Year: Never true  Transportation Needs: No Transportation Needs (09/15/2022)   PRAPARE - Administrator, Civil Service (Medical): No    Lack of Transportation (Non-Medical): No  Physical Activity: Sufficiently Active (09/15/2022)   Exercise Vital Sign    Days of Exercise per Week: 4 days    Minutes of Exercise per Session: 50 min  Stress: No Stress Concern Present (09/15/2022)   [employer] of Occupational Health - Occupational Stress Questionnaire    Feeling of Stress : Only a little  Social Connections: Moderately Integrated (09/15/2022)   Social Connection and Isolation Panel [NHANES]    Frequency of Communication with Friends and Family: More than three times a week  Frequency of Social Gatherings with Friends and Family: Once a week    Attends Religious Services: More than 4 times per year    Active Member of [employer] or Organizations: No    Attends Engineer, structural: Not on file    Marital Status: Married   Allergies  Allergen Reactions   Penicillins    Family History  Problem Relation Age of Onset   Colon polyps Mother    Hyperlipidemia Mother    Breast cancer Mother 78       breast   Heart attack Father    COPD Father    Colon polyps Sister    Heart attack Sister    Hyperlipidemia Sister    Hypertension Sister    Heart attack Sister    Hyperlipidemia Maternal Aunt    Breast cancer Maternal Aunt        breast   Hypertension Maternal Uncle    Diabetes Maternal Uncle    Colon cancer Maternal Uncle        colon   Hyperlipidemia Maternal Grandmother    Diabetes Maternal Grandmother    Breast cancer Maternal Grandmother        breast   Diabetes Maternal Grandfather    Sudden death Neg Hx    Esophageal cancer Neg Hx    Rectal cancer Neg Hx    Stomach cancer Neg Hx      Current Outpatient Medications (Cardiovascular):    furosemide (LASIX) 40 MG tablet, Take 1 tablet (40 mg total) by mouth daily. (Patient taking  differently: Take 40 mg by mouth as needed.)   rosuvastatin (CRESTOR) 10 MG tablet, Take 1 tablet (10 mg total) by mouth daily. *Patient needs an appointment for further refills. 2nd attempt.*  Current Outpatient Medications (Respiratory):    albuterol (VENTOLIN HFA) 108 (90 Base) MCG/ACT inhaler, Inhale 1-2 puffs into the lungs every 6 (six) hours as needed (shortness of breath).   cetirizine (ZYRTEC) 10 MG tablet, Take 1 tablet (10 mg total) by mouth daily.   fluticasone (FLONASE) 50 MCG/ACT nasal spray, Place 2 sprays into both nostrils daily.   sodium chloride (OCEAN) 0.65 % SOLN nasal spray, Place 1 spray into both nostrils as needed for congestion.  Current Outpatient Medications (Analgesics):    ibuprofen (ADVIL) 800 MG tablet, Take 1 tablet (800 mg total) by mouth every 6 (six) hours as needed for mild pain.  Current Outpatient Medications (Hematological):    cyanocobalamin (VITAMIN B12) 1000 MCG/ML injection, Inject 1 mL (1,000 mcg total) into the skin every 14 (fourteen) days.  Current Outpatient Medications (Other):    clindamycin (CLEOCIN) 300 MG capsule, Take 1 capsule (300 mg total) by mouth every 8 (eight) hours until gone. Take with dairy or probiotics.   doxycycline (VIBRA-TABS) 100 MG tablet, Take 1 tablet (100 mg total) by mouth every 12 (twelve) hours.   potassium chloride (KLOR-CON M) 10 MEQ tablet, Take 2 tablets (20 mEq total) by mouth with every dose of lasix. (Patient taking differently: Take 20 mEq by mouth as needed.)   Vitamin D, Ergocalciferol, (DRISDOL) 1.25 MG (50000 UNIT) CAPS capsule, Take 1 capsule (50,000 Units total) by mouth every 7 (seven) days.   Reviewed prior external information including notes and imaging from  primary care provider As well as notes that were available from care everywhere and other healthcare systems.  Past medical history, social, surgical and family history all reviewed in electronic medical record.  No pertanent information  unless stated regarding to the  chief complaint.   Review of Systems:  No headache, visual changes, nausea, vomiting, diarrhea, constipation, dizziness, abdominal pain, skin rash, fevers, chills, night sweats, weight loss, swollen lymph nodes, body aches, joint swelling, chest pain, shortness of breath, mood changes. POSITIVE muscle aches  Objective  Blood pressure (!) 138/98, pulse 86, height 5' 1.5" (1.562 m), weight 190 lb (86.2 kg), SpO2 98%.   General: No apparent distress alert and oriented x3 mood and affect normal, dressed appropriately.  HEENT: Pupils equal, extraocular movements intact  Respiratory: Patient's speak in full sentences and does not appear short of breath  Cardiovascular: No lower extremity edema, non tender, no erythema  Right knee does have some crepitus noted.  Does have some effusion noted.  Still has some arthritic changes noted as well.  After informed written and verbal consent, patient was seated on exam table. Right knee was prepped with alcohol swab and utilizing anterolateral approach, patient's right knee space was injected with 4:1  marcaine 0.5%: Kenalog 40mg /dL. Patient tolerated the procedure well without immediate complications.    Impression and Recommendations:     The above documentation has been reviewed and is accurate and complete Judi Saa, DO

## 2022-11-13 ENCOUNTER — Ambulatory Visit: Payer: BC Managed Care – PPO | Admitting: Family Medicine

## 2022-11-13 ENCOUNTER — Encounter: Payer: Self-pay | Admitting: Rheumatology

## 2022-11-13 ENCOUNTER — Ambulatory Visit: Payer: BC Managed Care – PPO | Attending: Rheumatology | Admitting: Rheumatology

## 2022-11-13 ENCOUNTER — Encounter: Payer: Self-pay | Admitting: Family Medicine

## 2022-11-13 VITALS — BP 138/98 | HR 86 | Ht 61.5 in | Wt 190.0 lb

## 2022-11-13 VITALS — BP 153/78 | HR 47 | Resp 15 | Ht 61.5 in | Wt 190.0 lb

## 2022-11-13 DIAGNOSIS — M19041 Primary osteoarthritis, right hand: Secondary | ICD-10-CM

## 2022-11-13 DIAGNOSIS — M25511 Pain in right shoulder: Secondary | ICD-10-CM

## 2022-11-13 DIAGNOSIS — Z8261 Family history of arthritis: Secondary | ICD-10-CM

## 2022-11-13 DIAGNOSIS — J45990 Exercise induced bronchospasm: Secondary | ICD-10-CM

## 2022-11-13 DIAGNOSIS — Z96642 Presence of left artificial hip joint: Secondary | ICD-10-CM

## 2022-11-13 DIAGNOSIS — C44320 Squamous cell carcinoma of skin of unspecified parts of face: Secondary | ICD-10-CM

## 2022-11-13 DIAGNOSIS — Z8719 Personal history of other diseases of the digestive system: Secondary | ICD-10-CM

## 2022-11-13 DIAGNOSIS — M19042 Primary osteoarthritis, left hand: Secondary | ICD-10-CM

## 2022-11-13 DIAGNOSIS — M25512 Pain in left shoulder: Secondary | ICD-10-CM

## 2022-11-13 DIAGNOSIS — E559 Vitamin D deficiency, unspecified: Secondary | ICD-10-CM

## 2022-11-13 DIAGNOSIS — M1711 Unilateral primary osteoarthritis, right knee: Secondary | ICD-10-CM | POA: Diagnosis not present

## 2022-11-13 DIAGNOSIS — M79672 Pain in left foot: Secondary | ICD-10-CM

## 2022-11-13 DIAGNOSIS — G8929 Other chronic pain: Secondary | ICD-10-CM | POA: Diagnosis not present

## 2022-11-13 DIAGNOSIS — M47816 Spondylosis without myelopathy or radiculopathy, lumbar region: Secondary | ICD-10-CM

## 2022-11-13 DIAGNOSIS — M170 Bilateral primary osteoarthritis of knee: Secondary | ICD-10-CM | POA: Diagnosis not present

## 2022-11-13 DIAGNOSIS — M79671 Pain in right foot: Secondary | ICD-10-CM

## 2022-11-13 DIAGNOSIS — I10 Essential (primary) hypertension: Secondary | ICD-10-CM

## 2022-11-13 DIAGNOSIS — E538 Deficiency of other specified B group vitamins: Secondary | ICD-10-CM

## 2022-11-13 DIAGNOSIS — R768 Other specified abnormal immunological findings in serum: Secondary | ICD-10-CM

## 2022-11-13 MED ORDER — TRIAMCINOLONE ACETONIDE 40 MG/ML IJ SUSP
40.0000 mg | INTRAMUSCULAR | Status: AC | PRN
Start: 2022-11-13 — End: 2022-11-13
  Administered 2022-11-13: 40 mg via INTRA_ARTICULAR

## 2022-11-13 MED ORDER — LIDOCAINE HCL 1 % IJ SOLN
1.5000 mL | INTRAMUSCULAR | Status: AC | PRN
Start: 2022-11-13 — End: 2022-11-13
  Administered 2022-11-13: 1.5 mL

## 2022-11-13 NOTE — Patient Instructions (Signed)
Injected knee today See me again in 2-3 months

## 2022-11-13 NOTE — Assessment & Plan Note (Addendum)
Patient given injection and tolerated the procedure well, discussed icing regimen and home exercises, discussed which activities to do and which ones to avoid, increase activity slowly otherwise.  Still trying to avoid any type of surgical intervention on the knee for the next year.  We discussed advanced imaging which patient declined.  He wanted to try the steroid injection to help her get through the holidays.

## 2022-11-13 NOTE — Patient Instructions (Signed)
Shoulder Exercises Ask your health care provider which exercises are safe for you. Do exercises exactly as told by your health care provider and adjust them as directed. It is normal to feel mild stretching, pulling, tightness, or discomfort as you do these exercises. Stop right away if you feel sudden pain or your pain gets worse. Do not begin these exercises until told by your health care provider. Stretching exercises External rotation and abduction This exercise is sometimes called corner stretch. The exercise rotates your arm outward (external rotation) and moves your arm out from your body (abduction). Stand in a doorway with one of your feet slightly in front of the other. This is called a staggered stance. If you cannot reach your forearms to the door frame, stand facing a corner of a room. Choose one of the following positions as told by your health care provider: Place your hands and forearms on the door frame above your head. Place your hands and forearms on the door frame at the height of your head. Place your hands on the door frame at the height of your elbows. Slowly move your weight onto your front foot until you feel a stretch across your chest and in the front of your shoulders. Keep your head and chest upright and keep your abdominal muscles tight. Hold for __________ seconds. To release the stretch, shift your weight to your back foot. Repeat __________ times. Complete this exercise __________ times a day. Extension, standing  Stand and hold a broomstick, a cane, or a similar object behind your back. Your hands should be a little wider than shoulder-width apart. Your palms should face away from your back. Keeping your elbows straight and your shoulder muscles relaxed, move the stick away from your body until you feel a stretch in your shoulders (extension). Avoid shrugging your shoulders while you move the stick. Keep your shoulder blades tucked down toward the middle of your  back. Hold for __________ seconds. Slowly return to the starting position. Repeat __________ times. Complete this exercise __________ times a day. Range-of-motion exercises Pendulum  Stand near a wall or a surface that you can hold onto for balance. Bend at the waist and let your left / right arm hang straight down. Use your other arm to support you. Keep your back straight and do not lock your knees. Relax your left / right arm and shoulder muscles, and move your hips and your trunk so your left / right arm swings freely. Your arm should swing because of the motion of your body, not because you are using your arm or shoulder muscles. Keep moving your hips and trunk so your arm swings in the following directions, as told by your health care provider: Side to side. Forward and backward. In clockwise and counterclockwise circles. Continue each motion for __________ seconds, or for as long as told by your health care provider. Slowly return to the starting position. Repeat __________ times. Complete this exercise __________ times a day. Shoulder flexion, standing  Stand and hold a broomstick, a cane, or a similar object. Place your hands a little more than shoulder-width apart on the object. Your left / right hand should be palm-up, and your other hand should be palm-down. Keep your elbow straight and your shoulder muscles relaxed. Push the stick up with your healthy arm to raise your left / right arm in front of your body, and then over your head until you feel a stretch in your shoulder (flexion). Avoid shrugging your shoulder  while you raise your arm. Keep your shoulder blade tucked down toward the middle of your back. Hold for __________ seconds. Slowly return to the starting position. Repeat __________ times. Complete this exercise __________ times a day. Shoulder abduction, standing  Stand and hold a broomstick, a cane, or a similar object. Place your hands a little more than  shoulder-width apart on the object. Your left / right hand should be palm-up, and your other hand should be palm-down. Keep your elbow straight and your shoulder muscles relaxed. Push the object across your body toward your left / right side. Raise your left / right arm to the side of your body (abduction) until you feel a stretch in your shoulder. Do not raise your arm above shoulder height unless your health care provider tells you to do that. If directed, raise your arm over your head. Avoid shrugging your shoulder while you raise your arm. Keep your shoulder blade tucked down toward the middle of your back. Hold for __________ seconds. Slowly return to the starting position. Repeat __________ times. Complete this exercise __________ times a day. Internal rotation  Place your left / right hand behind your back, palm-up. Use your other hand to dangle an exercise band, a broomstick, or a similar object over your shoulder. Grasp the band with your left / right hand so you are holding on to both ends. Gently pull up on the band until you feel a stretch in the front of your left / right shoulder. The movement of your arm toward the center of your body is called internal rotation. Avoid shrugging your shoulder while you raise your arm. Keep your shoulder blade tucked down toward the middle of your back. Hold for __________ seconds. Release the stretch by letting go of the band and lowering your hands. Repeat __________ times. Complete this exercise __________ times a day. Strengthening exercises External rotation  Sit in a stable chair without armrests. Secure an exercise band to a stable object at elbow height on your left / right side. Place a soft object, such as a folded towel or a small pillow, between your left / right upper arm and your body to move your elbow about 4 inches (10 cm) away from your side. Hold the end of the exercise band so it is tight and there is no slack. Keeping your  elbow pressed against the soft object, slowly move your forearm out, away from your abdomen (external rotation). Keep your body steady so only your forearm moves. Hold for __________ seconds. Slowly return to the starting position. Repeat __________ times. Complete this exercise __________ times a day. Shoulder abduction  Sit in a stable chair without armrests, or stand up. Hold a __________ lb / kg weight in your left / right hand, or hold an exercise band with both hands. Start with your arms straight down and your left / right palm facing in, toward your body. Slowly lift your left / right hand out to your side (abduction). Do not lift your hand above shoulder height unless your health care provider tells you that this is safe. Keep your arms straight. Avoid shrugging your shoulder while you do this movement. Keep your shoulder blade tucked down toward the middle of your back. Hold for __________ seconds. Slowly lower your arm, and return to the starting position. Repeat __________ times. Complete this exercise __________ times a day. Shoulder extension  Sit in a stable chair without armrests, or stand up. Secure an exercise band to a  stable object in front of you so it is at shoulder height. Hold one end of the exercise band in each hand. Straighten your elbows and lift your hands up to shoulder height. Squeeze your shoulder blades together as you pull your hands down to the sides of your thighs (extension). Stop when your hands are straight down by your sides. Do not let your hands go behind your body. Hold for __________ seconds. Slowly return to the starting position. Repeat __________ times. Complete this exercise __________ times a day. Shoulder row  Sit in a stable chair without armrests, or stand up. Secure an exercise band to a stable object in front of you so it is at chest height. Hold one end of the exercise band in each hand. Position your palms so that your thumbs are  facing the ceiling (neutral position). Bend each of your elbows to a 90-degree angle (right angle) and keep your upper arms at your sides. Step back or move the chair back until the band is tight and there is no slack. Slowly pull your elbows back behind you. Hold for __________ seconds. Slowly return to the starting position. Repeat __________ times. Complete this exercise __________ times a day. Shoulder press-ups  Sit in a stable chair that has armrests. Sit upright, with your feet flat on the floor. Put your hands on the armrests so your elbows are bent and your fingers are pointing forward. Your hands should be about even with the sides of your body. Push down on the armrests and use your arms to lift yourself off the chair. Straighten your elbows and lift yourself up as much as you comfortably can. Move your shoulder blades down, and avoid letting your shoulders move up toward your ears. Keep your feet on the ground. As you get stronger, your feet should support less of your body weight as you lift yourself up. Hold for __________ seconds. Slowly lower yourself back into the chair. Repeat __________ times. Complete this exercise __________ times a day. Wall push-ups  Stand so you are facing a stable wall. Your feet should be about one arm-length away from the wall. Lean forward and place your palms on the wall at shoulder height. Keep your feet flat on the floor as you bend your elbows and lean forward toward the wall. Hold for __________ seconds. Straighten your elbows to push yourself back to the starting position. Repeat __________ times. Complete this exercise __________ times a day. This information is not intended to replace advice given to you by your health care provider. Make sure you discuss any questions you have with your health care provider. Document Revised: 03/05/2021 Document Reviewed: 03/05/2021 Elsevier Patient Education  2024 [employer] Low Back Sprain or  Strain Rehab Ask your health care provider which exercises are safe for you. Do exercises exactly as told by your health care provider and adjust them as directed. It is normal to feel mild stretching, pulling, tightness, or discomfort as you do these exercises. Stop right away if you feel sudden pain or your pain gets worse. Do not begin these exercises until told by your health care provider. Stretching and range-of-motion exercises These exercises warm up your muscles and joints and improve the movement and flexibility of your back. These exercises also help to relieve pain, numbness, and tingling. Lumbar rotation  Lie on your back on a firm bed or the floor with your knees bent. Straighten your arms out to your sides so each arm forms a 90-degree  angle (right angle) with a side of your body. Slowly move (rotate) both of your knees to one side of your body until you feel a stretch in your lower back (lumbar). Try not to let your shoulders lift off the floor. Hold this position for __________ seconds. Tense your abdominal muscles and slowly move your knees back to the starting position. Repeat this exercise on the other side of your body. Repeat __________ times. Complete this exercise __________ times a day. Single knee to chest  Lie on your back on a firm bed or the floor with both legs straight. Bend one of your knees. Use your hands to move your knee up toward your chest until you feel a gentle stretch in your lower back and buttock. Hold your leg in this position by holding on to the front of your knee. Keep your other leg as straight as possible. Hold this position for __________ seconds. Slowly return to the starting position. Repeat with your other leg. Repeat __________ times. Complete this exercise __________ times a day. Prone extension on elbows  Lie on your abdomen on a firm bed or the floor (prone position). Prop yourself up on your elbows. Use your arms to help lift your  chest up until you feel a gentle stretch in your abdomen and your lower back. This will place some of your body weight on your elbows. If this is uncomfortable, try stacking pillows under your chest. Your hips should stay down, against the surface that you are lying on. Keep your hip and back muscles relaxed. Hold this position for __________ seconds. Slowly relax your upper body and return to the starting position. Repeat __________ times. Complete this exercise __________ times a day. Strengthening exercises These exercises build strength and endurance in your back. Endurance is the ability to use your muscles for a long time, even after they get tired. Pelvic tilt This exercise strengthens the muscles that lie deep in the abdomen. Lie on your back on a firm bed or the floor with your legs extended. Bend your knees so they are pointing toward the ceiling and your feet are flat on the floor. Tighten your lower abdominal muscles to press your lower back against the floor. This motion will tilt your pelvis so your tailbone points up toward the ceiling instead of pointing to your feet or the floor. To help with this exercise, you may place a small towel under your lower back and try to push your back into the towel. Hold this position for __________ seconds. Let your muscles relax completely before you repeat this exercise. Repeat __________ times. Complete this exercise __________ times a day. Alternating arm and leg raises  Get on your hands and knees on a firm surface. If you are on a hard floor, you may want to use padding, such as an exercise mat, to cushion your knees. Line up your arms and legs. Your hands should be directly below your shoulders, and your knees should be directly below your hips. Lift your left leg behind you. At the same time, raise your right arm and straighten it in front of you. Do not lift your leg higher than your hip. Do not lift your arm higher than your  shoulder. Keep your abdominal and back muscles tight. Keep your hips facing the ground. Do not arch your back. Keep your balance carefully, and do not hold your breath. Hold this position for __________ seconds. Slowly return to the starting position. Repeat with your right  leg and your left arm. Repeat __________ times. Complete this exercise __________ times a day. Abdominal set with straight leg raise  Lie on your back on a firm bed or the floor. Bend one of your knees and keep your other leg straight. Tense your abdominal muscles and lift your straight leg up, 4-6 inches (10-15 cm) off the ground. Keep your abdominal muscles tight and hold this position for __________ seconds. Do not hold your breath. Do not arch your back. Keep it flat against the ground. Keep your abdominal muscles tense as you slowly lower your leg back to the starting position. Repeat with your other leg. Repeat __________ times. Complete this exercise __________ times a day. Single leg lower with bent knees Lie on your back on a firm bed or the floor. Tense your abdominal muscles and lift your feet off the floor, one foot at a time, so your knees and hips are bent in 90-degree angles (right angles). Your knees should be over your hips and your lower legs should be parallel to the floor. Keeping your abdominal muscles tense and your knee bent, slowly lower one of your legs so your toe touches the ground. Lift your leg back up to return to the starting position. Do not hold your breath. Do not let your back arch. Keep your back flat against the ground. Repeat with your other leg. Repeat __________ times. Complete this exercise __________ times a day. Posture and body mechanics Good posture and healthy body mechanics can help to relieve stress in your body's tissues and joints. Body mechanics refers to the movements and positions of your body while you do your daily activities. Posture is part of body mechanics.  Good posture means: Your spine is in its natural S-curve position (neutral). Your shoulders are pulled back slightly. Your head is not tipped forward (neutral). Follow these guidelines to improve your posture and body mechanics in your everyday activities. Standing  When standing, keep your spine neutral and your feet about hip-width apart. Keep a slight bend in your knees. Your ears, shoulders, and hips should line up. When you do a task in which you stand in one place for a long time, place one foot up on a stable object that is 2-4 inches (5-10 cm) high, such as a footstool. This helps keep your spine neutral. Sitting  When sitting, keep your spine neutral and keep your feet flat on the floor. Use a footrest, if necessary, and keep your thighs parallel to the floor. Avoid rounding your shoulders, and avoid tilting your head forward. When working at a desk or a computer, keep your desk at a height where your hands are slightly lower than your elbows. Slide your chair under your desk so you are close enough to maintain good posture. When working at a computer, place your monitor at a height where you are looking straight ahead and you do not have to tilt your head forward or downward to look at the screen. Resting When lying down and resting, avoid positions that are most painful for you. If you have pain with activities such as sitting, bending, stooping, or squatting, lie in a position in which your body does not bend very much. For example, avoid curling up on your side with your arms and knees near your chest (fetal position). If you have pain with activities such as standing for a long time or reaching with your arms, lie with your spine in a neutral position and bend your knees  slightly. Try the following positions: Lying on your side with a pillow between your knees. Lying on your back with a pillow under your knees. Lifting  When lifting objects, keep your feet at least shoulder-width  apart and tighten your abdominal muscles. Bend your knees and hips and keep your spine neutral. It is important to lift using the strength of your legs, not your back. Do not lock your knees straight out. Always ask for help to lift heavy or awkward objects. This information is not intended to replace advice given to you by your health care provider. Make sure you discuss any questions you have with your health care provider. Document Revised: 05/19/2022 Document Reviewed: 04/02/2020 Elsevier Patient Education  2024 [employer].

## 2022-11-17 LAB — URIC ACID: Uric Acid, Serum: 5.8 mg/dL (ref 2.5–7.0)

## 2022-11-17 LAB — RHEUMATOID FACTOR: Rheumatoid fact SerPl-aCnc: 17 [IU]/mL — ABNORMAL HIGH (ref <14)

## 2022-11-17 LAB — CYCLIC CITRUL PEPTIDE ANTIBODY, IGG: Cyclic Citrullin Peptide Ab: <16 U

## 2022-11-17 LAB — SEDIMENTATION RATE: Sed Rate: 6 mm/h (ref 0–30)

## 2022-11-17 NOTE — Progress Notes (Signed)
Rheumatoid factor is low titer positive.  anti-CCP antibody for rheumatoid arthritis is negative.  Uric acid is within normal limits (does not indicate gout).  Sedimentation rate which is associated with inflammation is within normal limits.

## 2022-11-20 DIAGNOSIS — F4310 Post-traumatic stress disorder, unspecified: Secondary | ICD-10-CM | POA: Diagnosis not present

## 2022-11-24 ENCOUNTER — Encounter: Payer: Self-pay | Admitting: Family Medicine

## 2022-11-24 DIAGNOSIS — N3000 Acute cystitis without hematuria: Secondary | ICD-10-CM | POA: Diagnosis not present

## 2022-11-25 NOTE — Telephone Encounter (Signed)
Called pt lvm call the office to scheduled appt do VV per Dr.Wendling and sent mychart message.

## 2022-11-27 ENCOUNTER — Other Ambulatory Visit (HOSPITAL_BASED_OUTPATIENT_CLINIC_OR_DEPARTMENT_OTHER): Payer: Self-pay

## 2022-11-27 MED ORDER — CEFDINIR 300 MG PO CAPS
300.0000 mg | ORAL_CAPSULE | Freq: Two times a day (BID) | ORAL | 0 refills | Status: DC
  Filled 2022-11-27: qty 14, 7d supply, fill #0

## 2022-12-04 DIAGNOSIS — F4310 Post-traumatic stress disorder, unspecified: Secondary | ICD-10-CM | POA: Diagnosis not present

## 2022-12-05 ENCOUNTER — Other Ambulatory Visit (HOSPITAL_BASED_OUTPATIENT_CLINIC_OR_DEPARTMENT_OTHER): Payer: Self-pay

## 2022-12-05 ENCOUNTER — Ambulatory Visit: Payer: BC Managed Care – PPO | Admitting: Family Medicine

## 2022-12-05 ENCOUNTER — Encounter: Payer: Self-pay | Admitting: Family Medicine

## 2022-12-05 VITALS — BP 136/84 | HR 99 | Temp 98.0°F | Resp 16 | Ht 61.0 in | Wt 191.8 lb

## 2022-12-05 DIAGNOSIS — R350 Frequency of micturition: Secondary | ICD-10-CM

## 2022-12-05 LAB — POC URINALSYSI DIPSTICK (AUTOMATED)
Bilirubin, UA: NEGATIVE
Blood, UA: NEGATIVE
Glucose, UA: NEGATIVE
Ketones, UA: NEGATIVE
Leukocytes, UA: NEGATIVE
Nitrite, UA: NEGATIVE
Protein, UA: NEGATIVE
Spec Grav, UA: 1.01 (ref 1.010–1.025)
Urobilinogen, UA: 0.2 U/dL
pH, UA: 6 (ref 5.0–8.0)

## 2022-12-05 MED ORDER — CEFDINIR 300 MG PO CAPS
300.0000 mg | ORAL_CAPSULE | Freq: Two times a day (BID) | ORAL | 0 refills | Status: DC
  Filled 2022-12-05: qty 10, 5d supply, fill #0

## 2022-12-05 NOTE — Progress Notes (Signed)
Chief Complaint  Patient presents with   Urinary Tract Infection    Possible UTI    Stephanie Castro is a 57 y.o. female here for possible UTI.  Duration: 2 weeks. Symptoms: Dysuria, urinary hesitancy, urinary retention, and urgency Denies: urinary frequency, hematuria, fever, nausea, vomiting, vaginal discharge Went to urgent care, dx'd w UTI. Tx'd w Bactrim for a couple days, UC called and changed to Cefdinir for 7 days.  Hx of recurrent UTI? No Denies new sexual partners.  Past Medical History:  Diagnosis Date   Arthritis    both knees , back and elbows   BMI 35.0-35.9,adult 05/15/2013   Breast cancer screening, high risk patient 04/04/2013   Pt states she had mmg 2015 nml results, ordered by Dr  Riley Nearing, [facility] health care. Results are not in Care everywhere. [facility] in [facility] [place], [place] Category 1    Cardiac murmur 10/19/2019   Chronic pain of right knee 04/13/2020   Class 2 severe obesity due to excess calories with serious comorbidity and body mass index (BMI) of 35.0 to 35.9 in adult [facility]) 09/14/2020   Concern about female breast disease without diagnosis 03/17/2018   DOE (dyspnea on exertion) 08/08/2015   Dysplastic nevus    beneath r eye   Edema of extremities 07/07/2013   Last Assessment & Plan:  Pt takes HCTZ daily.   Last Assessment & Plan:  Formatting of this note might be different from the original. Pt takes HCTZ daily.   Elevated BP without diagnosis of hypertension 06/15/2015   Elevated uric acid in blood 05/02/2020   Essential hypertension 02/26/2017   Exercise-induced asthma    Fatigue 07/06/2013   Last Assessment & Plan:  Will review previous records from previous PCP, pt had thyroid done recently and was wnl. Pt had questions re lupus/MS and how to get tested for these.   Last Assessment & Plan:  Formatting of this note might be different from the  original. Will review previous records from previous PCP, pt had thyroid done recently and was wnl. Pt had questions re lupus/MS and how to get    GERD (gastroesophageal reflux disease)    Greater trochanteric bursitis of left hip 08/30/2020   Injection given August 30, 2020   Hypertension    Insomnia 05/27/2021   Lateral epicondylitis of both elbows 08/06/2018   Left hip pain 12/11/2020   Left knee pain 08/30/2020   Localized swelling of both lower extremities 06/05/2021   Low TSH level 04/22/2013   Mixed dyslipidemia 02/22/2020   Myalgia 07/06/2013   Last Assessment & Plan:  Formatting of this note might be different from the original. Will check labs today including sed rate, ANA/RF.  Last Assessment & Plan:  Formatting of this note might be different from the original. Pt would like to be tested for lupus in the future. Has been tested for RA and was negative   Obesity (BMI 35.0-39.9 without comorbidity) 11/30/2020   Other and unspecified hyperlipidemia 04/06/2011   Palpitations 10/19/2019   Perimenopausal 05/15/2013   Periodic edema 04/04/2013   Pt reports taking "water pill" for many years due to feeling "swollen". Historically, took HCTZ but became hypokalemic. She was switched to triamterene-HCTZ with no more hypokalemic episodes.     Postoperative seroma of musculoskeletal structure after musculoskeletal procedure 07/10/2021   Left hip anteriorly     Preventative health care 04/04/2013   Primary localized osteoarthritis  of right knee 10/13/2014   Snoring 05/27/2021   Squamous cell carcinoma, face 02/24/2014   Tear of medial meniscus of right knee 10/13/2014   Vitamin B12 deficiency 05/15/2013   Last Assessment & Plan:  Formatting of this note might be different from the original. Recheck level today, may need to restart monthly Vit B12 shots   Vitamin D deficiency 02/24/2014   Weight gain 04/24/2015     BP 136/84 (BP Location: Left Arm, Patient Position: Sitting, Cuff Size:  Normal)   Pulse 99   Temp 98 F (36.7 C) (Oral)   Resp 16   Ht 5\' 1"  (1.549 m)   Wt 191 lb 12.8 oz (87 kg)   SpO2 98%   BMI 36.24 kg/m  General: Awake, alert, appears stated age Heart: RRR Lungs: CTAB, normal respiratory effort, no accessory muscle usage Abd: BS+, soft, NT, ND, no masses or organomegaly MSK: No CVA tenderness, neg Lloyd's sign Psych: Age appropriate judgment and insight  Urine frequency - Plan: Urine Culture, POCT Urinalysis Dipstick (Automated), CANCELED: Urinalysis  Ck culture. UA neg. Will send in 5 more d of Cefdinir given lingering s/s's. Stay hydrated. Seek immediate care if pt starts to develop fevers, new/worsening symptoms, uncontrollable N/V. F/u prn. The patient voiced understanding and agreement to the plan.  Jilda Roche Struble, DO 12/05/22 7:15 AM

## 2022-12-05 NOTE — Patient Instructions (Signed)
Stay hydrated.   Warning signs/symptoms: Uncontrollable nausea/vomiting, fevers, worsening symptoms despite treatment, confusion.  Give us around 2 business days to get culture back to you.  Let us know if you need anything. 

## 2022-12-06 LAB — URINE CULTURE
MICRO NUMBER:: 15705821
Result:: NO GROWTH
SPECIMEN QUALITY:: ADEQUATE

## 2022-12-08 DIAGNOSIS — L570 Actinic keratosis: Secondary | ICD-10-CM | POA: Diagnosis not present

## 2022-12-08 DIAGNOSIS — D485 Neoplasm of uncertain behavior of skin: Secondary | ICD-10-CM | POA: Diagnosis not present

## 2022-12-10 ENCOUNTER — Encounter: Payer: Self-pay | Admitting: Family Medicine

## 2022-12-10 ENCOUNTER — Other Ambulatory Visit: Payer: Self-pay | Admitting: Family Medicine

## 2022-12-10 ENCOUNTER — Other Ambulatory Visit (HOSPITAL_BASED_OUTPATIENT_CLINIC_OR_DEPARTMENT_OTHER): Payer: Self-pay

## 2022-12-10 DIAGNOSIS — N390 Urinary tract infection, site not specified: Secondary | ICD-10-CM

## 2022-12-10 MED ORDER — NITROFURANTOIN MONOHYD MACRO 100 MG PO CAPS
100.0000 mg | ORAL_CAPSULE | Freq: Two times a day (BID) | ORAL | 0 refills | Status: AC
Start: 2022-12-10 — End: 2022-12-17
  Filled 2022-12-10: qty 14, 7d supply, fill #0

## 2022-12-10 MED ORDER — PHENAZOPYRIDINE HCL 200 MG PO TABS
200.0000 mg | ORAL_TABLET | Freq: Three times a day (TID) | ORAL | 0 refills | Status: DC | PRN
Start: 2022-12-10 — End: 2023-01-01
  Filled 2022-12-10: qty 10, 4d supply, fill #0

## 2022-12-24 ENCOUNTER — Other Ambulatory Visit (HOSPITAL_BASED_OUTPATIENT_CLINIC_OR_DEPARTMENT_OTHER): Payer: Self-pay

## 2023-01-01 ENCOUNTER — Other Ambulatory Visit (HOSPITAL_BASED_OUTPATIENT_CLINIC_OR_DEPARTMENT_OTHER): Payer: Self-pay

## 2023-01-01 ENCOUNTER — Encounter: Payer: Self-pay | Admitting: Physician Assistant

## 2023-01-01 ENCOUNTER — Ambulatory Visit: Payer: BC Managed Care – PPO | Admitting: Physician Assistant

## 2023-01-01 VITALS — BP 147/86 | HR 53 | Temp 97.6°F | Ht 61.0 in | Wt 194.5 lb

## 2023-01-01 DIAGNOSIS — N390 Urinary tract infection, site not specified: Secondary | ICD-10-CM

## 2023-01-01 DIAGNOSIS — R3129 Other microscopic hematuria: Secondary | ICD-10-CM

## 2023-01-01 DIAGNOSIS — R300 Dysuria: Secondary | ICD-10-CM | POA: Diagnosis not present

## 2023-01-01 LAB — URINALYSIS, MICROSCOPIC ONLY: RBC / HPF: NONE SEEN (ref 0–?)

## 2023-01-01 LAB — POC URINALSYSI DIPSTICK (AUTOMATED)
Glucose, UA: NEGATIVE
Nitrite, UA: NEGATIVE
Protein, UA: NEGATIVE
Spec Grav, UA: 1.02 (ref 1.010–1.025)
Urobilinogen, UA: 0.2 U/dL
pH, UA: 6 (ref 5.0–8.0)

## 2023-01-01 MED ORDER — PHENAZOPYRIDINE HCL 200 MG PO TABS
200.0000 mg | ORAL_TABLET | Freq: Three times a day (TID) | ORAL | 0 refills | Status: DC | PRN
  Filled 2023-01-01: qty 21, 7d supply, fill #0

## 2023-01-01 NOTE — Patient Instructions (Addendum)
[facility] [phone]  Noel Christmas, MD Deirdre Priest. Dancy, MMS, PA-C Bartholomew Crews, DNP, AGNP-C

## 2023-01-01 NOTE — Progress Notes (Signed)
Established patient visit   Patient: Stephanie Castro   DOB: [birth year]   57 y.o. Female  MRN: [id number] Visit Date: 01/01/2023  Today's healthcare provider: Alfredia Ferguson, PA-C   Chief Complaint  Patient presents with   Urinary Tract Infection    Was treated for UTI a few weeks back, seemed to not go away. Feels like she is not emptying out    Subjective     Pt is a 57 y/o female s/p hysterectomy, who was seen 11/8 for urinary frequency, tx with cefdinir, culture returned no growth. She was still concerned over a full bladder feeling, a referral was placed to urology and pt was sent a course of macrobid.  Today, she still feels as if she has a bladder infection-- frequency, urgency.  Medications: Outpatient Medications Prior to Visit  Medication Sig   cyanocobalamin (VITAMIN B12) 1000 MCG/ML injection Inject 1 mL (1,000 mcg total) into the skin every 14 (fourteen) days.   Vitamin D, Ergocalciferol, (DRISDOL) 1.25 MG (50000 UNIT) CAPS capsule Take 1 capsule (50,000 Units total) by mouth every 7 (seven) days.   albuterol (VENTOLIN HFA) 108 (90 Base) MCG/ACT inhaler Inhale 1-2 puffs into the lungs every 6 (six) hours as needed (shortness of breath).   cetirizine (ZYRTEC) 10 MG tablet Take 1 tablet (10 mg total) by mouth daily.   fluticasone (FLONASE) 50 MCG/ACT nasal spray Place 2 sprays into both nostrils daily.   furosemide (LASIX) 40 MG tablet Take 1 tablet (40 mg total) by mouth daily. (Patient taking differently: Take 40 mg by mouth as needed.)   ibuprofen (ADVIL) 800 MG tablet Take 1 tablet (800 mg total) by mouth every 6 (six) hours as needed for mild pain.   potassium chloride (KLOR-CON M) 10 MEQ tablet Take 2 tablets (20 mEq total) by mouth with every dose of lasix. (Patient taking differently: Take 20 mEq by mouth as needed.)   rosuvastatin (CRESTOR) 10 MG tablet Take 1 tablet (10 mg total) by mouth daily. *Patient needs an appointment for further refills. 2nd attempt.*    sodium chloride (OCEAN) 0.65 % SOLN nasal spray Place 1 spray into both nostrils as needed for congestion.   [DISCONTINUED] phenazopyridine (PYRIDIUM) 200 MG tablet Take 1 tablet (200 mg total) by mouth 3 (three) times daily as needed for pain.   No facility-administered medications prior to visit.    Review of Systems  Constitutional:  Negative for fatigue and fever.  Respiratory:  Negative for cough and shortness of breath.   Cardiovascular:  Negative for chest pain and leg swelling.  Gastrointestinal:  Negative for abdominal pain.  Genitourinary:  Positive for frequency and urgency.  Neurological:  Negative for dizziness and headaches.       Objective    BP (!) 147/86   Pulse (!) 53   Temp 97.6 F (36.4 C) (Oral)   Ht 5\' 1"  (1.549 m)   Wt 194 lb 8 oz (88.2 kg)   SpO2 99%   BMI 36.75 kg/m    Physical Exam Vitals reviewed.  Constitutional:      Appearance: She is not ill-appearing.  HENT:     Head: Normocephalic.  Eyes:     Conjunctiva/sclera: Conjunctivae normal.  Cardiovascular:     Rate and Rhythm: Normal rate.  Pulmonary:     Effort: Pulmonary effort is normal. No respiratory distress.  Neurological:     General: No focal deficit present.     Mental Status: She is alert and  oriented to person, place, and time.  Psychiatric:        Mood and Affect: Mood normal.        Behavior: Behavior normal.      Results for orders placed or performed in visit on 01/01/23  POCT Urinalysis Dipstick (Automated)  Result Value Ref Range   Color, UA Golden    Clarity, UA Cloudy    Glucose, UA Negative Negative   Bilirubin, UA small    Ketones, UA trace    Spec Grav, UA 1.020 1.010 - 1.025   Blood, UA moderate (A)    pH, UA 6.0 5.0 - 8.0   Protein, UA Negative Negative   Urobilinogen, UA 0.2 0.2 or 1.0 E.U./dL   Nitrite, UA Negative    Leukocytes, UA Trace (A) Negative    Assessment & Plan    Recurrent UTI -     Urine Culture -     Mycoplasma / ureaplasma  culture -     Phenazopyridine HCl; Take 1 tablet (200 mg total) by mouth 3 (three) times daily as needed for pain.  Dispense: 21 tablet; Refill: 0  Dysuria -     POCT Urinalysis Dipstick (Automated)  Microscopic hematuria -     Urine Microscopic   UA with trace leuks, blood. Will run culture, mycoplasma/ureaplasma culture, urine micro to confirm hematuria Pt given urology # for an appt Will treat as indicated w/ lab results.  Return if symptoms worsen or fail to improve.       Alfredia Ferguson, PA-C  [facility] [phone] (phone) [phone] (fax)  [facility]

## 2023-01-03 LAB — URINE CULTURE
MICRO NUMBER:: 15813434
SPECIMEN QUALITY:: ADEQUATE

## 2023-01-05 ENCOUNTER — Other Ambulatory Visit: Payer: Self-pay | Admitting: Physician Assistant

## 2023-01-05 ENCOUNTER — Other Ambulatory Visit (HOSPITAL_BASED_OUTPATIENT_CLINIC_OR_DEPARTMENT_OTHER): Payer: Self-pay

## 2023-01-05 DIAGNOSIS — N390 Urinary tract infection, site not specified: Secondary | ICD-10-CM

## 2023-01-05 MED ORDER — NITROFURANTOIN MONOHYD MACRO 100 MG PO CAPS
100.0000 mg | ORAL_CAPSULE | Freq: Two times a day (BID) | ORAL | 0 refills | Status: AC
  Filled 2023-01-05: qty 14, 7d supply, fill #0

## 2023-01-06 DIAGNOSIS — F4310 Post-traumatic stress disorder, unspecified: Secondary | ICD-10-CM | POA: Diagnosis not present

## 2023-01-07 NOTE — Progress Notes (Unsigned)
[facility] [address] [place] [address] Phone: [phone] Subjective:   INadine Castro, am serving as a scribe for Dr. Antoine Primas.  I'm seeing this patient by the request  of:  Sharlene Dory, DO  CC:   [id number]  11/13/2022 Patient given injection and tolerated the procedure well, discussed icing regimen and home exercises, discussed which activities to do and which ones to avoid, increase activity slowly otherwise.  Still trying to avoid any type of surgical intervention on the knee for the next year.  We discussed advanced imaging which patient declined.  He wanted to try the steroid injection to help her get through the holidays.     Updated 01/08/2023 Stephanie Castro is a 57 y.o. female coming in with complaint of R knee pain. Doing well. Doesn't like brace. When brace stays in place it helps.       Past Medical History:  Diagnosis Date   Arthritis    both knees , back and elbows   BMI 35.0-35.9,adult 05/15/2013   Breast cancer screening, high risk patient 04/04/2013   Pt states she had mmg 2015 nml results, ordered by Dr  Riley Nearing, [facility] health care. Results are not in Care everywhere. Mammogram completed [facility]/12/2015 at [facility] in [facility] [place], [place] Category 1    Cardiac murmur 10/19/2019   Chronic pain of right knee 04/13/2020   Class 2 severe obesity due to excess calories with serious comorbidity and body mass index (BMI) of 35.0 to 35.9 in adult [facility]) 09/14/2020   Concern about female breast disease without diagnosis 03/17/2018   DOE (dyspnea on exertion) 08/08/2015   Dysplastic nevus    beneath r eye   Edema of extremities 07/07/2013   Last Assessment & Plan:  Pt takes HCTZ daily.   Last Assessment & Plan:  Formatting of this note might be different from the original. Pt takes HCTZ daily.   Elevated BP without diagnosis of  hypertension 06/15/2015   Elevated uric acid in blood 05/02/2020   Essential hypertension 02/26/2017   Exercise-induced asthma    Fatigue 07/06/2013   Last Assessment & Plan:  Will review previous records from previous PCP, pt had thyroid done recently and was wnl. Pt had questions re lupus/MS and how to get tested for these.   Last Assessment & Plan:  Formatting of this note might be different from the original. Will review previous records from previous PCP, pt had thyroid done recently and was wnl. Pt had questions re lupus/MS and how to get    GERD (gastroesophageal reflux disease)    Greater trochanteric bursitis of left hip 08/30/2020   Injection given August 30, 2020   Hypertension    Insomnia 05/27/2021   Lateral epicondylitis of both elbows 08/06/2018   Left hip pain 12/11/2020   Left knee pain 08/30/2020   Localized swelling of both lower extremities 06/05/2021   Low TSH level 04/22/2013   Mixed dyslipidemia 02/22/2020   Myalgia 07/06/2013   Last Assessment & Plan:  Formatting of this note might be different from the original. Will check labs today including sed rate, ANA/RF.  Last Assessment & Plan:  Formatting of this note might be different from the original. Pt would like to be tested for lupus in the future. Has been tested for RA and was negative   Obesity (BMI 35.0-39.9 without comorbidity) 11/30/2020   Other and unspecified hyperlipidemia 04/06/2011  Palpitations 10/19/2019   Perimenopausal 05/15/2013   Periodic edema 04/04/2013   Pt reports taking "water pill" for many years due to feeling "swollen". Historically, took HCTZ but became hypokalemic. She was switched to triamterene-HCTZ with no more hypokalemic episodes.     Postoperative seroma of musculoskeletal structure after musculoskeletal procedure 07/10/2021   Left hip anteriorly     Preventative health care 04/04/2013   Primary localized osteoarthritis of right knee 10/13/2014   Snoring 05/27/2021   Squamous  cell carcinoma, face 02/24/2014   Tear of medial meniscus of right knee 10/13/2014   Vitamin B12 deficiency 05/15/2013   Last Assessment & Plan:  Formatting of this note might be different from the original. Recheck level today, may need to restart monthly Vit B12 shots   Vitamin D deficiency 02/24/2014   Weight gain 04/24/2015   Past Surgical History:  Procedure Laterality Date   ABDOMINAL HYSTERECTOMY  2007   KNEE ARTHROSCOPY WITH MEDIAL MENISECTOMY Right 10/13/2014   Procedure: RIGHT KNEE ARTHROSCOPY WITH PARTIAL MEDIAL MENISECTOMY AND CHONDROPLASTY ;  Surgeon: Teryl Lucy, MD;  Location: [facility];  Service: Orthopedics;  Laterality: Right;   TOTAL HIP ARTHROPLASTY Left 03/29/2021   Social History   Socioeconomic History   Marital status: Married    Spouse name: Not on file   Number of children: Not on file   Years of education: Not on file   Highest education level: Some college, no degree  Occupational History   Not on file  Tobacco Use   Smoking status: Former    Current packs/day: 0.00    Average packs/day: 1 pack/day for 20.0 years (20.0 ttl pk-yrs)    Types: Cigarettes    Start date: 6    Quit date: 2005    Years since quitting: 19.9    Passive exposure: Never   Smokeless tobacco: Never  Vaping Use   Vaping status: Never Used  Substance and Sexual Activity   Alcohol use: Yes    Alcohol/week: 2.0 standard drinks of alcohol    Types: 1 Glasses of wine, 1 Shots of liquor per week    Comment: occ   Drug use: Never   Sexual activity: Yes    Birth control/protection: Surgical  Other Topics Concern   Not on file  Social History Narrative   Married to Kamariyah.    Social Drivers of Corporate investment banker Strain: Low Risk  (12/04/2022)   Overall Financial Resource Strain (CARDIA)    Difficulty of Paying Living Expenses: Not hard at all  Food Insecurity: No Food Insecurity (12/04/2022)   Hunger Vital Sign    Worried About Running Out of  Food in the Last Year: Never true    Ran Out of Food in the Last Year: Never true  Transportation Needs: No Transportation Needs (12/04/2022)   PRAPARE - Administrator, Civil Service (Medical): No    Lack of Transportation (Non-Medical): No  Physical Activity: Insufficiently Active (12/04/2022)   Exercise Vital Sign    Days of Exercise per Week: 3 days    Minutes of Exercise per Session: 30 min  Stress: No Stress Concern Present (12/04/2022)   [employer] of Occupational Health - Occupational Stress Questionnaire    Feeling of Stress : Only a little  Social Connections: Socially Integrated (12/04/2022)   Social Connection and Isolation Panel [NHANES]    Frequency of Communication with Friends and Family: More than three times a week    Frequency of Social  Gatherings with Friends and Family: Once a week    Attends Religious Services: More than 4 times per year    Active Member of [employer] or Organizations: Yes    Attends Engineer, structural: More than 4 times per year    Marital Status: Married   Allergies  Allergen Reactions   Penicillins    Family History  Problem Relation Age of Onset   Colon polyps Mother    Hyperlipidemia Mother    Breast cancer Mother 79       breast   Heart attack Father    COPD Father    Colon polyps Sister    Heart attack Sister    Hyperlipidemia Sister    Hypertension Sister    Heart attack Sister    Hyperlipidemia Maternal Aunt    Breast cancer Maternal Aunt        breast   Hypertension Maternal Uncle    Diabetes Maternal Uncle    Colon cancer Maternal Uncle        colon   Hyperlipidemia Maternal Grandmother    Diabetes Maternal Grandmother    Breast cancer Maternal Grandmother        breast   Diabetes Maternal Grandfather    Sudden death Neg Hx    Esophageal cancer Neg Hx    Rectal cancer Neg Hx    Stomach cancer Neg Hx      Current Outpatient Medications (Cardiovascular):    furosemide (LASIX) 40 MG tablet,  Take 1 tablet (40 mg total) by mouth daily. (Patient taking differently: Take 40 mg by mouth as needed.)   rosuvastatin (CRESTOR) 10 MG tablet, Take 1 tablet (10 mg total) by mouth daily. *Patient needs an appointment for further refills. 2nd attempt.*  Current Outpatient Medications (Respiratory):    albuterol (VENTOLIN HFA) 108 (90 Base) MCG/ACT inhaler, Inhale 1-2 puffs into the lungs every 6 (six) hours as needed (shortness of breath).   cetirizine (ZYRTEC) 10 MG tablet, Take 1 tablet (10 mg total) by mouth daily.   fluticasone (FLONASE) 50 MCG/ACT nasal spray, Place 2 sprays into both nostrils daily.   sodium chloride (OCEAN) 0.65 % SOLN nasal spray, Place 1 spray into both nostrils as needed for congestion.  Current Outpatient Medications (Analgesics):    ibuprofen (ADVIL) 800 MG tablet, Take 1 tablet (800 mg total) by mouth every 6 (six) hours as needed for mild pain.  Current Outpatient Medications (Hematological):    cyanocobalamin (VITAMIN B12) 1000 MCG/ML injection, Inject 1 mL (1,000 mcg total) into the skin every 14 (fourteen) days.  Current Outpatient Medications (Other):    nitrofurantoin, macrocrystal-monohydrate, (MACROBID) 100 MG capsule, Take 1 capsule (100 mg total) by mouth 2 (two) times daily for 7 days.   phenazopyridine (PYRIDIUM) 200 MG tablet, Take 1 tablet (200 mg total) by mouth 3 (three) times daily as needed for pain.   potassium chloride (KLOR-CON M) 10 MEQ tablet, Take 2 tablets (20 mEq total) by mouth with every dose of lasix. (Patient taking differently: Take 20 mEq by mouth as needed.)   Vitamin D, Ergocalciferol, (DRISDOL) 1.25 MG (50000 UNIT) CAPS capsule, Take 1 capsule (50,000 Units total) by mouth every 7 (seven) days.   Reviewed prior external information including notes and imaging from  primary care provider As well as notes that were available from care everywhere and other healthcare systems.  Past medical history, social, surgical and family  history all reviewed in electronic medical record.  No pertanent information unless stated regarding to  the chief complaint.   Review of Systems:  No headache, visual changes, nausea, vomiting, diarrhea, constipation, dizziness, abdominal pain, skin rash, fevers, chills, night sweats, weight loss, swollen lymph nodes, body aches, joint swelling, chest pain, shortness of breath, mood changes. POSITIVE muscle aches  Objective  Blood pressure 122/84, pulse 85, height 5\' 1"  (1.549 m), weight 198 lb (89.8 kg), SpO2 97%.   General: No apparent distress alert and oriented x3 mood and affect normal, dressed appropriately.  HEENT: Pupils equal, extraocular movements intact  Respiratory: Patient's speak in full sentences and does not appear short of breath  Cardiovascular: No lower extremity edema, non tender, no erythema  Mild antalgic gait noted.  Does have the instability of the knee.  The patient is wearing her brace appropriately.  After informed written and verbal consent, patient was seated on exam table. Right knee was prepped with alcohol swab and utilizing anterolateral approach, patient's right knee space was injected with 4:1  marcaine 0.5%: Kenalog 40mg /dL. Patient tolerated the procedure well without immediate complications.    Impression and Recommendations:     The above documentation has been reviewed and is accurate and complete Judi Saa, DO

## 2023-01-08 ENCOUNTER — Encounter: Payer: Self-pay | Admitting: Family Medicine

## 2023-01-08 ENCOUNTER — Ambulatory Visit: Payer: BC Managed Care – PPO | Admitting: Family Medicine

## 2023-01-08 VITALS — BP 122/84 | HR 85 | Ht 61.0 in | Wt 198.0 lb

## 2023-01-08 DIAGNOSIS — M1711 Unilateral primary osteoarthritis, right knee: Secondary | ICD-10-CM | POA: Diagnosis not present

## 2023-01-08 NOTE — Patient Instructions (Signed)
Great to see you as always Happy holidays Try the hot glue trick See me again in 2 to 3 months

## 2023-01-08 NOTE — Assessment & Plan Note (Signed)
Chronic, with exacerbation.  Severe arthritic changes noted.  Discussed with patient about icing regimen and home exercises, discussed which activities to do and which ones to avoid.  Patient will be traveling and hopefully the injection will be helpful.  Will continue wear the brace and we discussed a different things over the helpful.  Follow-up again in 6 to 8 weeks.

## 2023-01-10 LAB — MYCOPLASMA / UREAPLASMA CULTURE

## 2023-01-19 ENCOUNTER — Other Ambulatory Visit (HOSPITAL_BASED_OUTPATIENT_CLINIC_OR_DEPARTMENT_OTHER): Payer: Self-pay

## 2023-01-22 ENCOUNTER — Other Ambulatory Visit (HOSPITAL_BASED_OUTPATIENT_CLINIC_OR_DEPARTMENT_OTHER): Payer: Self-pay

## 2023-01-22 DIAGNOSIS — N8111 Cystocele, midline: Secondary | ICD-10-CM | POA: Diagnosis not present

## 2023-01-22 DIAGNOSIS — Z1231 Encounter for screening mammogram for malignant neoplasm of breast: Secondary | ICD-10-CM | POA: Diagnosis not present

## 2023-01-22 DIAGNOSIS — Z01411 Encounter for gynecological examination (general) (routine) with abnormal findings: Secondary | ICD-10-CM | POA: Diagnosis not present

## 2023-01-22 DIAGNOSIS — N951 Menopausal and female climacteric states: Secondary | ICD-10-CM | POA: Diagnosis not present

## 2023-01-22 MED ORDER — IMVEXXY STARTER PACK 10 MCG VA INST
VAGINAL_INSERT | VAGINAL | 3 refills | Status: DC
  Filled 2023-01-22: qty 18, 55d supply, fill #0
  Filled 2023-01-22: qty 18, 30d supply, fill #0

## 2023-01-23 ENCOUNTER — Other Ambulatory Visit (HOSPITAL_BASED_OUTPATIENT_CLINIC_OR_DEPARTMENT_OTHER): Payer: Self-pay

## 2023-01-23 MED ORDER — PREMARIN 0.625 MG/GM VA CREA
0.5000 | TOPICAL_CREAM | VAGINAL | 6 refills | Status: AC
  Filled 2023-01-23: qty 30, 30d supply, fill #0
  Filled 2023-03-09: qty 30, 30d supply, fill #1
  Filled 2023-05-27: qty 30, 30d supply, fill #2

## 2023-02-03 DIAGNOSIS — F329 Major depressive disorder, single episode, unspecified: Secondary | ICD-10-CM | POA: Diagnosis not present

## 2023-02-12 ENCOUNTER — Telehealth: Payer: Self-pay

## 2023-02-12 NOTE — Telephone Encounter (Signed)
Initial Comment Caller was transferred from the office, and told to speak to a nurse. They made an appt. for tomorrow morning, because they've been getting headaches, their BP was 157/108, so when they made the online appt. they got a call and told the patient to speak to a nurse. Translation No Nurse Assessment Nurse: Mayford Knife, RN, Lupe Carney Date/Time Lamount Cohen Time): 02/12/2023 10:56:27 AM Confirm and document reason for call. If symptomatic, describe symptoms. ---they've been getting headaches, their BP was 157/108 Does the patient have any new or worsening symptoms? ---Yes Will a triage be completed? ---Yes Related visit to physician within the last 2 weeks? ---No Does the PT have any chronic conditions? (i.e. diabetes, asthma, this includes High risk factors for pregnancy, etc.) ---Yes List chronic conditions. ---Hormone replacement therapy Is this a behavioral health or substance abuse call? ---No Guidelines Guideline Title Affirmed Question Affirmed Notes Nurse Date/Time (Eastern Time) Blood Pressure - High [1] Systolic BP >= 160 OR Diastolic >= 100 AND [2] cardiac (e.g., breathing difficulty, chest pain) or neurologic symptoms (e.g., new-onset blurred Turner, RN, Lupe Carney 02/12/2023 10:57:47 AM PLEASE NOTE: All timestamps contained within this report are represented as [place] Standard Time. CONFIDENTIALTY NOTICE: This fax transmission is intended only for the addressee. It contains information that is legally privileged, confidential or otherwise protected from use or disclosure. If you are not the intended recipient, you are strictly prohibited from reviewing, disclosing, copying using or disseminating any of this information or taking any action in reliance on or regarding this information. If you have received this fax in error, please notify us immediately by telephone so that we can arrange for its return to [place]. Phone: [phone], Toll-Free: [phone], Fax:  [phone] Page: 2 of 2 Call Id: [id number] Guidelines Guideline Title Affirmed Question Affirmed Notes Nurse Date/Time Lamount Cohen Time) or double vision, unsteady gait) Disp. Time Lamount Cohen Time) Disposition Final User 02/12/2023 11:02:05 AM Go to ED Now Yes Mayford Knife, RN, Lupe Carney Final Disposition 02/12/2023 11:02:05 AM Go to ED Now Yes Mayford Knife, RN, Debroah Loop Disagree/Comply Disagree Caller Understands Yes PreDisposition Call Doctor Care Advice Given Per Guideline GO TO ED NOW: * You need to be seen in the Emergency Department. CARE ADVICE given per Blood Pressure - High (Adult) guideline. Comments User: Hadley Pen, RN Date/Time Lamount Cohen Time): 02/12/2023 11:04:09 AM Declines to go to ED. States she is going to keep her appt at 9 tomorrow. Will go to the ED if symptoms worsen overnight. Referrals GO TO FACILITY REFUSED

## 2023-02-12 NOTE — Telephone Encounter (Signed)
Pt going to ED.  

## 2023-02-13 ENCOUNTER — Encounter: Payer: Self-pay | Admitting: Family

## 2023-02-13 ENCOUNTER — Other Ambulatory Visit (HOSPITAL_BASED_OUTPATIENT_CLINIC_OR_DEPARTMENT_OTHER): Payer: Self-pay

## 2023-02-13 ENCOUNTER — Ambulatory Visit: Payer: BC Managed Care – PPO | Admitting: Family

## 2023-02-13 VITALS — BP 148/90 | HR 47 | Ht 61.0 in | Wt 195.0 lb

## 2023-02-13 DIAGNOSIS — I10 Essential (primary) hypertension: Secondary | ICD-10-CM

## 2023-02-13 DIAGNOSIS — R001 Bradycardia, unspecified: Secondary | ICD-10-CM | POA: Diagnosis not present

## 2023-02-13 LAB — COMPREHENSIVE METABOLIC PANEL
ALT: 9 U/L (ref 0–35)
AST: 17 U/L (ref 0–37)
Albumin: 4.4 g/dL (ref 3.5–5.2)
Alkaline Phosphatase: 69 U/L (ref 39–117)
BUN: 10 mg/dL (ref 6–23)
CO2: 29 meq/L (ref 19–32)
Calcium: 9.1 mg/dL (ref 8.4–10.5)
Chloride: 106 meq/L (ref 96–112)
Creatinine, Ser: 0.7 mg/dL (ref 0.40–1.20)
GFR: 95.2 mL/min (ref >60.00)
Glucose, Bld: 82 mg/dL (ref 70–99)
Potassium: 3.6 meq/L (ref 3.5–5.1)
Sodium: 141 meq/L (ref 135–145)
Total Bilirubin: 0.7 mg/dL (ref 0.2–1.2)
Total Protein: 6.6 g/dL (ref 6.0–8.3)

## 2023-02-13 LAB — CBC WITH DIFFERENTIAL/PLATELET
Basophils Absolute: 0 10*3/uL (ref 0.0–0.1)
Basophils Relative: 0.3 % (ref 0.0–3.0)
Eosinophils Absolute: 0.1 10*3/uL (ref 0.0–0.7)
Eosinophils Relative: 2.4 % (ref 0.0–5.0)
HCT: 40.9 % (ref 36.0–46.0)
Hemoglobin: 13.5 g/dL (ref 12.0–15.0)
Lymphocytes Relative: 37.9 % (ref 12.0–46.0)
Lymphs Abs: 2.1 10*3/uL (ref 0.7–4.0)
MCHC: 33 g/dL (ref 30.0–36.0)
MCV: 92.2 fL (ref 78.0–100.0)
Monocytes Absolute: 0.4 10*3/uL (ref 0.1–1.0)
Monocytes Relative: 6.6 % (ref 3.0–12.0)
Neutro Abs: 3 10*3/uL (ref 1.4–7.7)
Neutrophils Relative %: 52.8 % (ref 43.0–77.0)
Platelets: 190 10*3/uL (ref 150.0–400.0)
RBC: 4.43 Mil/uL (ref 3.87–5.11)
RDW: 13.6 % (ref 11.5–15.5)
WBC: 5.7 10*3/uL (ref 4.0–10.5)

## 2023-02-13 MED ORDER — VALSARTAN 80 MG PO TABS
80.0000 mg | ORAL_TABLET | Freq: Every day | ORAL | 0 refills | Status: DC
  Filled 2023-02-13: qty 90, 90d supply, fill #0

## 2023-02-13 NOTE — Patient Instructions (Addendum)
Please schedule a follow up with Dr. Carmelia Roller for 2 weeks;  Your pulse is very low again today- typically with numbers like this we recommend having you see a cardiologist. I appreciate that you feel this is your normal value but would still recommend follow up with cardiology. I will let you discuss with Dr. Carmelia Roller at your upcoming visit. Please start checking your blood pressure and pulse daily as discussed and bring those readings and blood pressure cuff that that appointment with him. Please use your arm cuff- not the wrist cuff.

## 2023-02-13 NOTE — Progress Notes (Signed)
Stephanie Castro is a 58 y.o. female with the following history as recorded in EpicCare:  Patient Active Problem List   Diagnosis Date Noted   Postoperative seroma of musculoskeletal structure after musculoskeletal procedure 07/10/2021   Localized swelling of both lower extremities 06/05/2021   Snoring 05/27/2021   Insomnia 05/27/2021   Left hip pain 12/11/2020   Obesity (BMI 35.0-39.9 without comorbidity) 11/30/2020   Class 2 severe obesity due to excess calories with serious comorbidity and body mass index (BMI) of 35.0 to 35.9 in adult (HCC) 09/14/2020   Greater trochanteric bursitis of left hip 08/30/2020   Left knee pain 08/30/2020   Elevated uric acid in blood 05/02/2020   Chronic pain of right knee 04/13/2020   Arthritis    Dysplastic nevus    Hypertension    Palpitations 10/19/2019   Cardiac murmur 10/19/2019   Lateral epicondylitis of both elbows 08/06/2018   Concern about female breast disease without diagnosis 03/17/2018   Essential hypertension 02/26/2017   Exercise-induced asthma    DOE (dyspnea on exertion) 08/08/2015   Elevated BP without diagnosis of hypertension 06/15/2015   Weight gain 04/24/2015   Primary localized osteoarthritis of right knee 10/13/2014   Tear of medial meniscus of right knee 10/13/2014   Squamous cell carcinoma, face 02/24/2014   Vitamin D deficiency 02/24/2014   Edema of extremities 07/07/2013   Fatigue 07/06/2013   GERD (gastroesophageal reflux disease) 07/06/2013   Myalgia 07/06/2013   Vitamin B12 deficiency 05/15/2013   BMI 35.0-35.9,adult 05/15/2013   Perimenopausal 05/15/2013   Low TSH level 04/22/2013   Breast cancer screening, high risk patient 04/04/2013   Preventative health care 04/04/2013   Periodic edema 04/04/2013   Other and unspecified hyperlipidemia 04/06/2011    Current Outpatient Medications  Medication Sig Dispense Refill   conjugated estrogens (PREMARIN) vaginal cream Insert 0.5 applicatorsful every day by vaginal  route at bedtime for 30 days. 30 g 6   cyanocobalamin (VITAMIN B12) 1000 MCG/ML injection Inject 1 mL (1,000 mcg total) into the skin every 14 (fourteen) days. 10 mL 0   valsartan (DIOVAN) 80 MG tablet Take 1 tablet (80 mg total) by mouth daily. 90 tablet 0   Vitamin D, Ergocalciferol, (DRISDOL) 1.25 MG (50000 UNIT) CAPS capsule Take 1 capsule (50,000 Units total) by mouth every 7 (seven) days. 12 capsule 0   albuterol (VENTOLIN HFA) 108 (90 Base) MCG/ACT inhaler Inhale 1-2 puffs into the lungs every 6 (six) hours as needed (shortness of breath). (Patient not taking: Reported on 02/13/2023) 6.7 g 1   cetirizine (ZYRTEC) 10 MG tablet Take 1 tablet (10 mg total) by mouth daily. (Patient not taking: Reported on 02/13/2023) 100 tablet 2   Estradiol Starter Pack (IMVEXXY STARTER PACK) 10 MCG INST INSERT 1 VAGINAL INSERT (10 MCG) BY VAGINAL ROUTE ONCE DAILY FOR 2 WEEKS THEN 1 INSERT (10 MCG) TWICE WEEKLY FOR DURATION OF USE (Patient not taking: Reported on 02/13/2023) 54 each 3   fluticasone (FLONASE) 50 MCG/ACT nasal spray Place 2 sprays into both nostrils daily. (Patient not taking: Reported on 02/13/2023) 16 g 6   furosemide (LASIX) 40 MG tablet Take 1 tablet (40 mg total) by mouth daily. (Patient not taking: Reported on 02/13/2023) 30 tablet 3   ibuprofen (ADVIL) 800 MG tablet Take 1 tablet (800 mg total) by mouth every 6 (six) hours as needed for mild pain. (Patient not taking: Reported on 02/13/2023) 20 tablet 0   phenazopyridine (PYRIDIUM) 200 MG tablet Take 1 tablet (200 mg  total) by mouth 3 (three) times daily as needed for pain. (Patient not taking: Reported on 02/13/2023) 21 tablet 0   potassium chloride (KLOR-CON M) 10 MEQ tablet Take 2 tablets (20 mEq total) by mouth with every dose of lasix. (Patient not taking: Reported on 02/13/2023) 60 tablet 2   rosuvastatin (CRESTOR) 10 MG tablet Take 1 tablet (10 mg total) by mouth daily. *Patient needs an appointment for further refills. 2nd attempt.* (Patient not  taking: Reported on 02/13/2023) 15 tablet 0   sodium chloride (OCEAN) 0.65 % SOLN nasal spray Place 1 spray into both nostrils as needed for congestion. (Patient not taking: Reported on 02/13/2023) 44 mL 3   No current facility-administered medications for this visit.    Allergies: Penicillins  Past Medical History:  Diagnosis Date   Arthritis    both knees , back and elbows   BMI 35.0-35.9,adult 05/15/2013   Breast cancer screening, high risk patient 04/04/2013   Pt states she had mmg 2015 nml results, ordered by Dr  Riley Nearing, [facility] health care. Results are not in Care everywhere. [facility] [place], [place] Category 1    Cardiac murmur 10/19/2019   Chronic pain of right knee 04/13/2020   Class 2 severe obesity due to excess calories with serious comorbidity and body mass index (BMI) of 35.0 to 35.9 in adult [facility]) 09/14/2020   Concern about female breast disease without diagnosis 03/17/2018   DOE (dyspnea on exertion) 08/08/2015   Dysplastic nevus    beneath r eye   Edema of extremities 07/07/2013   Last Assessment & Plan:  Pt takes HCTZ daily.   Last Assessment & Plan:  Formatting of this note might be different from the original. Pt takes HCTZ daily.   Elevated BP without diagnosis of hypertension 06/15/2015   Elevated uric acid in blood 05/02/2020   Essential hypertension 02/26/2017   Exercise-induced asthma    Fatigue 07/06/2013   Last Assessment & Plan:  Will review previous records from previous PCP, pt had thyroid done recently and was wnl. Pt had questions re lupus/MS and how to get tested for these.   Last Assessment & Plan:  Formatting of this note might be different from the original. Will review previous records from previous PCP, pt had thyroid done recently and was wnl. Pt had questions re lupus/MS and how to get    GERD (gastroesophageal reflux disease)     Greater trochanteric bursitis of left hip 08/30/2020   Injection given August 30, 2020   Hypertension    Insomnia 05/27/2021   Lateral epicondylitis of both elbows 08/06/2018   Left hip pain 12/11/2020   Left knee pain 08/30/2020   Localized swelling of both lower extremities 06/05/2021   Low TSH level 04/22/2013   Mixed dyslipidemia 02/22/2020   Myalgia 07/06/2013   Last Assessment & Plan:  Formatting of this note might be different from the original. Will check labs today including sed rate, ANA/RF.  Last Assessment & Plan:  Formatting of this note might be different from the original. Pt would like to be tested for lupus in the future. Has been tested for RA and was negative   Obesity (BMI 35.0-39.9 without comorbidity) 11/30/2020   Other and unspecified hyperlipidemia 04/06/2011   Palpitations 10/19/2019   Perimenopausal 05/15/2013   Periodic edema 04/04/2013   Pt reports taking "water pill" for many years due to feeling "swollen". Historically, took HCTZ  but became hypokalemic. She was switched to triamterene-HCTZ with no more hypokalemic episodes.     Postoperative seroma of musculoskeletal structure after musculoskeletal procedure 07/10/2021   Left hip anteriorly     Preventative health care 04/04/2013   Primary localized osteoarthritis of right knee 10/13/2014   Snoring 05/27/2021   Squamous cell carcinoma, face 02/24/2014   Tear of medial meniscus of right knee 10/13/2014   Vitamin B12 deficiency 05/15/2013   Last Assessment & Plan:  Formatting of this note might be different from the original. Recheck level today, may need to restart monthly Vit B12 shots   Vitamin D deficiency 02/24/2014   Weight gain 04/24/2015    Past Surgical History:  Procedure Laterality Date   ABDOMINAL HYSTERECTOMY  2007   KNEE ARTHROSCOPY WITH MEDIAL MENISECTOMY Right 10/13/2014   Procedure: RIGHT KNEE ARTHROSCOPY WITH PARTIAL MEDIAL MENISECTOMY AND CHONDROPLASTY ;  Surgeon: Teryl Lucy, MD;   Location: [facility];  Service: Orthopedics;  Laterality: Right;   TOTAL HIP ARTHROPLASTY Left 03/29/2021    Family History  Problem Relation Age of Onset   Colon polyps Mother    Hyperlipidemia Mother    Breast cancer Mother 67       breast   Heart attack Father    COPD Father    Colon polyps Sister    Heart attack Sister    Hyperlipidemia Sister    Hypertension Sister    Heart attack Sister    Hyperlipidemia Maternal Aunt    Breast cancer Maternal Aunt        breast   Hypertension Maternal Uncle    Diabetes Maternal Uncle    Colon cancer Maternal Uncle        colon   Hyperlipidemia Maternal Grandmother    Diabetes Maternal Grandmother    Breast cancer Maternal Grandmother        breast   Diabetes Maternal Grandfather    Sudden death Neg Hx    Esophageal cancer Neg Hx    Rectal cancer Neg Hx    Stomach cancer Neg Hx     Social History   Tobacco Use   Smoking status: Former    Current packs/day: 0.00    Average packs/day: 1 pack/day for 20.0 years (20.0 ttl pk-yrs)    Types: Cigarettes    Start date: 32    Quit date: 2005    Years since quitting: 20.0    Passive exposure: Never   Smokeless tobacco: Never  Substance Use Topics   Alcohol use: Yes    Alcohol/week: 2.0 standard drinks of alcohol    Types: 1 Glasses of wine, 1 Shots of liquor per week    Comment: occ    Subjective:   Concerned about elevated blood pressure; in reviewing medical history, elevated blood pressure documented in 2017 and hypertension documented in 2019; took Olmesartan and HCTZ in the past but notes these medications were stopped when she was able to lose about 20 pounds; has noticed increased headaches in the past few weeks; admits "not eating as well as I should be." Has a prescription for Lasix daily but has not taken for months;  Denies any chest pain, shortness of breath, blurred vision or headache. Notes she is concerned about previous dosage of Olmesartan- would be  interested in trying a lower dosage if possible;      Objective:  Vitals:   02/13/23 0902 02/13/23 0957  BP: (!) 148/90 (!) 148/90  Pulse: (!) 47   SpO2: 99%  Weight: 195 lb (88.5 kg)   Height: 5\' 1"  (1.549 m)     General: Well developed, well nourished, in no acute distress  Skin : Warm and dry.  Head: Normocephalic and atraumatic  Eyes: Sclera and conjunctiva clear; pupils round and reactive to light; extraocular movements intact  Ears: External normal; canals clear; tympanic membranes normal  Oropharynx: Pink, supple. No suspicious lesions  Neck: Supple without thyromegaly, adenopathy  Lungs: Respirations unlabored; clear to auscultation bilaterally without wheeze, rales, rhonchi  CVS exam: normal rate and regular rhythm.  Neurologic: Alert and oriented; speech intact; face symmetrical; moves all extremities well; CNII-XII intact without focal deficit   Assessment:  1. Essential hypertension   2. Bradycardia     Plan:  EKG does show bradycardia- patient feels this is a normal reading for her; Discussed cardiology referral which patient says she has done in the past;  Will let her discuss with Dr. Carmelia Roller at next visit. She is to start checking your blood pressure and pulse daily as discussed and bring those readings and blood pressure cuff that that appointment with her PCP in 2 weeks.  Trial of Valsartan 80 mg daily;  Return in about 2 weeks (around 02/27/2023) for  Dr. Carmelia Roller 2 week follow up.  Orders Placed This Encounter  Procedures   CBC with Differential/Platelet   Comp Met (CMET)   EKG 12-Lead    Requested Prescriptions   Signed Prescriptions Disp Refills   valsartan (DIOVAN) 80 MG tablet 90 tablet 0    Sig: Take 1 tablet (80 mg total) by mouth daily.

## 2023-02-19 DIAGNOSIS — F4310 Post-traumatic stress disorder, unspecified: Secondary | ICD-10-CM | POA: Diagnosis not present

## 2023-02-27 ENCOUNTER — Ambulatory Visit: Payer: BC Managed Care – PPO | Admitting: Family Medicine

## 2023-02-27 ENCOUNTER — Encounter: Payer: Self-pay | Admitting: Family Medicine

## 2023-02-27 VITALS — BP 130/78 | HR 57 | Temp 98.0°F | Resp 16 | Ht 61.0 in | Wt 197.2 lb

## 2023-02-27 DIAGNOSIS — I10 Essential (primary) hypertension: Secondary | ICD-10-CM

## 2023-02-27 DIAGNOSIS — R413 Other amnesia: Secondary | ICD-10-CM | POA: Diagnosis not present

## 2023-02-27 NOTE — Progress Notes (Signed)
Chief Complaint  Patient presents with   Follow-up    Follow up    Subjective Stephanie Castro is a 58 y.o. female who presents for hypertension follow up. She does monitor home blood pressures. Blood pressures ranging from 120-130's/70-80's on average. She is compliant with medication- valsartan 80 mg/d. Patient has these side effects of medication: none She is usually adhering to a healthy diet overall. Current exercise: pilates No CP or SOB.   Over the past couple months she has been forgetting things.  She has difficulty finding words at times.  She also forgets intimate details of relationships  For instance, she forgot her grandson's favorite baseball team for a moment and also forgot the name of the longtime coworker's husband.  Short-term memory seems to be less of an issue though.  Stress levels are consistently high but no recent change.  Diet/exercise as above.  Sleep is unchanged.  Mom was diagnosed with Alzheimer's in the early 60s.  No trauma to the head.   Past Medical History:  Diagnosis Date   Arthritis    both knees , back and elbows   BMI 35.0-35.9,adult 05/15/2013   Breast cancer screening, high risk patient 04/04/2013   Pt states she had mmg 2015 nml results, ordered by Dr  Riley Nearing, [facility] health care. Results are not in Care everywhere. [facility] [place], [place] Category 1    Cardiac murmur 10/19/2019   Chronic pain of right knee 04/13/2020   Class 2 severe obesity due to excess calories with serious comorbidity and body mass index (BMI) of 35.0 to 35.9 in adult [facility]) 09/14/2020   Concern about female breast disease without diagnosis 03/17/2018   DOE (dyspnea on exertion) 08/08/2015   Dysplastic nevus    beneath r eye   Edema of extremities 07/07/2013   Last Assessment & Plan:  Pt takes HCTZ daily.   Last Assessment & Plan:  Formatting of this note  might be different from the original. Pt takes HCTZ daily.   Elevated BP without diagnosis of hypertension 06/15/2015   Elevated uric acid in blood 05/02/2020   Essential hypertension 02/26/2017   Exercise-induced asthma    Fatigue 07/06/2013   Last Assessment & Plan:  Will review previous records from previous PCP, pt had thyroid done recently and was wnl. Pt had questions re lupus/MS and how to get tested for these.   Last Assessment & Plan:  Formatting of this note might be different from the original. Will review previous records from previous PCP, pt had thyroid done recently and was wnl. Pt had questions re lupus/MS and how to get    GERD (gastroesophageal reflux disease)    Greater trochanteric bursitis of left hip 08/30/2020   Injection given August 30, 2020   Hypertension    Insomnia 05/27/2021   Lateral epicondylitis of both elbows 08/06/2018   Left hip pain 12/11/2020   Left knee pain 08/30/2020   Localized swelling of both lower extremities 06/05/2021   Low TSH level 04/22/2013   Mixed dyslipidemia 02/22/2020   Myalgia 07/06/2013   Last Assessment & Plan:  Formatting of this note might be different from the original. Will check labs today including sed rate, ANA/RF.  Last Assessment & Plan:  Formatting of this note might be different from the original. Pt would like to be tested for lupus in the future. Has been tested for RA and was negative  Obesity (BMI 35.0-39.9 without comorbidity) 11/30/2020   Other and unspecified hyperlipidemia 04/06/2011   Palpitations 10/19/2019   Perimenopausal 05/15/2013   Periodic edema 04/04/2013   Pt reports taking "water pill" for many years due to feeling "swollen". Historically, took HCTZ but became hypokalemic. She was switched to triamterene-HCTZ with no more hypokalemic episodes.     Postoperative seroma of musculoskeletal structure after musculoskeletal procedure 07/10/2021   Left hip anteriorly     Preventative health care 04/04/2013    Primary localized osteoarthritis of right knee 10/13/2014   Snoring 05/27/2021   Squamous cell carcinoma, face 02/24/2014   Tear of medial meniscus of right knee 10/13/2014   Vitamin B12 deficiency 05/15/2013   Last Assessment & Plan:  Formatting of this note might be different from the original. Recheck level today, may need to restart monthly Vit B12 shots   Vitamin D deficiency 02/24/2014   Weight gain 04/24/2015    Exam BP 130/78   Pulse (!) 57   Temp 98 F (36.7 C) (Oral)   Resp 16   Ht 5\' 1"  (1.549 m)   Wt 197 lb 3.2 oz (89.4 kg)   SpO2 98%   BMI 37.26 kg/m  General:  well developed, well nourished, in no apparent distress Heart: Regular rhythm, bradycardic, no bruits, no LE edema Lungs: clear to auscultation, no accessory muscle use Psych: well oriented with normal range of affect and appropriate judgment/insight  Essential hypertension  Memory change - Plan: B12, CBC, Comprehensive metabolic panel, TSH  Chronic, stable.  Continue valsartan 80 mg daily.  Counseled on diet and exercise. Chronic, unstable.  Check above labs.  Will refer to neurology versus neuropsychology for further evaluation.  MRI brain ordered. F/u as originally scheduled The patient voiced understanding and agreement to the plan.  Jilda Roche [place], DO 02/27/23  4:29 PM

## 2023-02-27 NOTE — Patient Instructions (Signed)
Give [place] 2-3 business days to get the results of your labs back.   We will be in touch with the MRI.  If you do not hear anything about your referral in the next 1-2 weeks, call our office and ask for an update.  Let us know if you need anything.

## 2023-02-28 ENCOUNTER — Encounter: Payer: Self-pay | Admitting: Family Medicine

## 2023-02-28 LAB — CBC
HCT: 39.3 % (ref 35.0–45.0)
Hemoglobin: 13.1 g/dL (ref 11.7–15.5)
MCH: 30.5 pg (ref 27.0–33.0)
MCHC: 33.3 g/dL (ref 32.0–36.0)
MCV: 91.6 fL (ref 80.0–100.0)
MPV: 10.6 fL (ref 7.5–12.5)
Platelets: 199 10*3/uL (ref 140–400)
RBC: 4.29 10*6/uL (ref 3.80–5.10)
RDW: 12.2 % (ref 11.0–15.0)
WBC: 6.3 10*3/uL (ref 3.8–10.8)

## 2023-02-28 LAB — COMPREHENSIVE METABOLIC PANEL
AG Ratio: 1.9 (calc) (ref 1.0–2.5)
ALT: 9 U/L (ref 6–29)
AST: 16 U/L (ref 10–35)
Albumin: 4.3 g/dL (ref 3.6–5.1)
Alkaline phosphatase (APISO): 73 U/L (ref 37–153)
BUN: 17 mg/dL (ref 7–25)
CO2: 27 mmol/L (ref 20–32)
Calcium: 9.3 mg/dL (ref 8.6–10.4)
Chloride: 107 mmol/L (ref 98–110)
Creat: 0.76 mg/dL (ref 0.50–1.03)
Globulin: 2.3 g/dL (ref 1.9–3.7)
Glucose, Bld: 87 mg/dL (ref 65–99)
Potassium: 3.8 mmol/L (ref 3.5–5.3)
Sodium: 141 mmol/L (ref 135–146)
Total Bilirubin: 0.4 mg/dL (ref 0.2–1.2)
Total Protein: 6.6 g/dL (ref 6.1–8.1)

## 2023-02-28 LAB — VITAMIN B12: Vitamin B-12: 444 pg/mL (ref 200–1100)

## 2023-02-28 LAB — TSH: TSH: 1.75 m[IU]/L (ref 0.40–4.50)

## 2023-03-05 DIAGNOSIS — F4310 Post-traumatic stress disorder, unspecified: Secondary | ICD-10-CM | POA: Diagnosis not present

## 2023-03-07 ENCOUNTER — Ambulatory Visit: Payer: BC Managed Care – PPO

## 2023-03-07 DIAGNOSIS — R413 Other amnesia: Secondary | ICD-10-CM

## 2023-03-07 DIAGNOSIS — I6782 Cerebral ischemia: Secondary | ICD-10-CM | POA: Diagnosis not present

## 2023-03-09 ENCOUNTER — Other Ambulatory Visit (HOSPITAL_BASED_OUTPATIENT_CLINIC_OR_DEPARTMENT_OTHER): Payer: Self-pay

## 2023-03-10 ENCOUNTER — Encounter: Payer: Self-pay | Admitting: Family Medicine

## 2023-03-10 NOTE — Telephone Encounter (Signed)
Called [facility] ask if they could read imaging sooner and they stated would move it up on the list.

## 2023-03-18 ENCOUNTER — Ambulatory Visit: Payer: BC Managed Care – PPO | Admitting: Cardiology

## 2023-03-19 DIAGNOSIS — F4310 Post-traumatic stress disorder, unspecified: Secondary | ICD-10-CM | POA: Diagnosis not present

## 2023-03-24 ENCOUNTER — Other Ambulatory Visit (HOSPITAL_BASED_OUTPATIENT_CLINIC_OR_DEPARTMENT_OTHER): Payer: Self-pay

## 2023-03-25 ENCOUNTER — Other Ambulatory Visit (HOSPITAL_BASED_OUTPATIENT_CLINIC_OR_DEPARTMENT_OTHER): Payer: Self-pay

## 2023-03-25 MED ORDER — ALPRAZOLAM 0.5 MG PO TABS
ORAL_TABLET | ORAL | 0 refills | Status: DC
  Filled 2023-03-25: qty 1, 1d supply, fill #0

## 2023-03-26 ENCOUNTER — Other Ambulatory Visit (HOSPITAL_BASED_OUTPATIENT_CLINIC_OR_DEPARTMENT_OTHER): Payer: Self-pay

## 2023-03-26 MED ORDER — ALPRAZOLAM 1 MG PO TABS
1.0000 mg | ORAL_TABLET | Freq: Every day | ORAL | 0 refills | Status: DC
  Filled 2023-03-26: qty 1, 1d supply, fill #0

## 2023-04-09 DIAGNOSIS — F4310 Post-traumatic stress disorder, unspecified: Secondary | ICD-10-CM | POA: Diagnosis not present

## 2023-04-21 DIAGNOSIS — F4310 Post-traumatic stress disorder, unspecified: Secondary | ICD-10-CM | POA: Diagnosis not present

## 2023-04-28 ENCOUNTER — Encounter: Payer: Self-pay | Admitting: Cardiology

## 2023-04-28 ENCOUNTER — Ambulatory Visit: Payer: BC Managed Care – PPO | Attending: Cardiology | Admitting: Cardiology

## 2023-04-28 VITALS — BP 130/70 | HR 51 | Ht 61.6 in | Wt 198.0 lb

## 2023-04-28 DIAGNOSIS — E782 Mixed hyperlipidemia: Secondary | ICD-10-CM

## 2023-04-28 DIAGNOSIS — I10 Essential (primary) hypertension: Secondary | ICD-10-CM | POA: Diagnosis not present

## 2023-04-28 DIAGNOSIS — E66812 Obesity, class 2: Secondary | ICD-10-CM | POA: Diagnosis not present

## 2023-04-28 DIAGNOSIS — I700 Atherosclerosis of aorta: Secondary | ICD-10-CM | POA: Insufficient documentation

## 2023-04-28 DIAGNOSIS — Z6835 Body mass index (BMI) 35.0-35.9, adult: Secondary | ICD-10-CM

## 2023-04-28 NOTE — Patient Instructions (Signed)
 Please keep a BP log for 2 weeks and send by MyChart or mail.                         Name and DOB__________________________ Dr. Tomie China [address] [place], [place] [address]  Blood Pressure Record Sheet To take your blood pressure, you will need a blood pressure machine. You can buy a blood pressure machine (blood pressure monitor) at your clinic, drug store, or online. When choosing one, consider: An automatic monitor that has an arm cuff. A cuff that wraps snugly around your upper arm. You should be able to fit only one finger between your arm and the cuff. A device that stores blood pressure reading results. Do not choose a monitor that measures your blood pressure from your wrist or finger. Follow your health care provider's instructions for how to take your blood pressure. To use this form: Get one reading in the morning (a.m.) 1-2 hours after you take any medicines. Get one reading in the evening (p.m.) before supper.   Blood pressure log Date: _______________________  a.m. _____________________(1st reading) HR___________            p.m. _____________________(2nd reading) HR__________  Date: _______________________  a.m. _____________________(1st reading) HR___________            p.m. _____________________(2nd reading) HR__________  Date: _______________________  a.m. _____________________(1st reading) HR___________            p.m. _____________________(2nd reading) HR__________  Date: _______________________  a.m. _____________________(1st reading) HR___________            p.m. _____________________(2nd reading) HR__________  Date: _______________________  a.m. _____________________(1st reading) HR___________            p.m. _____________________(2nd reading) HR__________  Date: _______________________  a.m. _____________________(1st reading) HR___________            p.m. _____________________(2nd reading) HR__________  Date: _______________________  a.m.  _____________________(1st reading) HR___________            p.m. _____________________(2nd reading) HR__________   This information is not intended to replace advice given to you by your health care provider. Make sure you discuss any questions you have with your health care provider. Document Revised: 05/04/2019 Document Reviewed: 05/04/2019 Elsevier Patient Education  2021 Elsevier Inc.   Medication Instructions:  Your physician recommends that you continue on your current medications as directed. Please refer to the Current Medication list given to you today.  *If you need a refill on your cardiac medications before your next appointment, please call your pharmacy*   Lab Work: Your physician recommends that you return for lab work in: the next few days for CMP and lipids. You need to have labs done when you are fasting. [facility] lab is located on the 3rd floor, [address]. Hours are Monday - Friday 8 am to 4 pm, closed 11:30 am to 1:00 pm. You do NOT need an appointment.   If you have labs (blood work) drawn today and your tests are completely normal, you will receive your results only by: MyChart Message (if you have MyChart) OR A paper copy in the mail If you have any lab test that is abnormal or we need to change your treatment, we will call you to review the results.   Testing/Procedures: None ordered   Follow-Up: At [facility], you and your health needs are our priority.  As part of our continuing mission to provide you with exceptional heart care, we have created  designated Provider Care Teams.  These Care Teams include your primary Cardiologist (physician) and Advanced Practice Providers (APPs -  Physician Assistants and Nurse Practitioners) who all work together to provide you with the care you need, when you need it.  We recommend signing up for the patient portal called "MyChart".  Sign up information is provided on this After Visit Summary.  MyChart is used  to connect with patients for Virtual Visits (Telemedicine).  Patients are able to view lab/test results, encounter notes, upcoming appointments, etc.  Non-urgent messages can be sent to your provider as well.   To learn more about what you can do with MyChart, go to [email or web address].    Your next appointment:   12 month(s)  The format for your next appointment:   In Person  Provider:   Belva Crome, MD    Other Instructions none  Important Information About Sugar

## 2023-04-28 NOTE — Progress Notes (Signed)
 Cardiology Office Note:    Date:  04/28/2023   ID:  Stephanie Castro, DOB [birth year], MRN [id number]  PCP:  Sharlene Dory, DO  Cardiologist:  Garwin Brothers, MD   Referring MD: Sharlene Dory*    ASSESSMENT:    1. Mixed dyslipidemia   2. Essential hypertension   3. Class 2 severe obesity due to excess calories with serious comorbidity and body mass index (BMI) of 35.0 to 35.9 in adult (HCC)   4. Aortic atherosclerosis (HCC)    PLAN:    In order of problems listed above:  Aortic atherosclerosis and plaque secondary prevention stressed with the patient.  Importance of compliance with diet medication stressed and patient verbalized standing.  She was advised to ambulate to the best of her ability. Essential hypertension: She is on no medication and blood pressure is stable she has had elevated blood pressures in the past.  She was advised to keep a track of her blood pressures 2 or 3 times a day and send [place] a blood pressure log.  She is agreeable.  Diet, salt intake issues were discussed. Mixed dyslipidemia: Not on statin therapy.  I counseled her and cautioned her about this.  She is willing to get blood work done and start statin therapy now.  Benefits risks explained and questions were answered to her satisfaction. Obesity: Diet was emphasized.  Weight reduction stressed and she promises to do better.  Risks of obesity explained. Patient will be seen in follow-up appointment in 6 months or earlier if the patient has any concerns.    Medication Adjustments/Labs and Tests Ordered: Current medicines are reviewed at length with the patient today.  Concerns regarding medicines are outlined above.  Orders Placed This Encounter  Procedures   Comprehensive metabolic panel with GFR   Lipid panel   EKG 12-Lead   No orders of the defined types were placed in this encounter.    No chief complaint on file.    History of Present Illness:    Stephanie Castro is a 58  y.o. female.  Patient has past medical history of essential hypertension, dyslipidemia and significant aortic atherosclerosis and plaque.  She denies any problems at this time.  She leads a sedentary lifestyle.  She is active replacement past.  She is not taking any blood pressure cholesterol medicines at this time and tells me that she quit them by herself.  At the time of my evaluation, the patient is alert awake oriented and in no distress.  Past Medical History:  Diagnosis Date   Arthritis    both knees , back and elbows   BMI 35.0-35.9,adult 05/15/2013   Breast cancer screening, high risk patient 04/04/2013   Pt states she had mmg 2015 nml results, ordered by Dr  Riley Nearing, [facility] health care. Results are not in Care everywhere. [facility] in [facility] [place], [place] Category 1    Cardiac murmur 10/19/2019   Chronic pain of right knee 04/13/2020   Class 2 severe obesity due to excess calories with serious comorbidity and body mass index (BMI) of 35.0 to 35.9 in adult [facility]) 09/14/2020   Concern about female breast disease without diagnosis 03/17/2018   DOE (dyspnea on exertion) 08/08/2015   Dysplastic nevus    beneath r eye   Edema of extremities 07/07/2013   Last Assessment & Plan:  Pt takes HCTZ daily.   Last Assessment & Plan:  Formatting of this note might be different from the original. Pt takes HCTZ daily.   Elevated BP without diagnosis of hypertension 06/15/2015   Elevated uric acid in blood 05/02/2020   Essential hypertension 02/26/2017   Exercise-induced asthma    Fatigue 07/06/2013   Last Assessment & Plan:  Will review previous records from previous PCP, pt had thyroid done recently and was wnl. Pt had questions re lupus/MS and how to get tested for these.   Last Assessment & Plan:  Formatting of this note might be different from the original. Will review previous records from  previous PCP, pt had thyroid done recently and was wnl. Pt had questions re lupus/MS and how to get    GERD (gastroesophageal reflux disease)    Greater trochanteric bursitis of left hip 08/30/2020   Injection given August 30, 2020   Hypertension    Insomnia 05/27/2021   Lateral epicondylitis of both elbows 08/06/2018   Left hip pain 12/11/2020   Left knee pain 08/30/2020   Localized swelling of both lower extremities 06/05/2021   Low TSH level 04/22/2013   Mixed dyslipidemia 02/22/2020   Myalgia 07/06/2013   Last Assessment & Plan:  Formatting of this note might be different from the original. Will check labs today including sed rate, ANA/RF.  Last Assessment & Plan:  Formatting of this note might be different from the original. Pt would like to be tested for lupus in the future. Has been tested for RA and was negative   Obesity (BMI 35.0-39.9 without comorbidity) 11/30/2020   Other and unspecified hyperlipidemia 04/06/2011   Palpitations 10/19/2019   Perimenopausal 05/15/2013   Periodic edema 04/04/2013   Pt reports taking "water pill" for many years due to feeling "swollen". Historically, took HCTZ but became hypokalemic. She was switched to triamterene-HCTZ with no more hypokalemic episodes.     Postoperative seroma of musculoskeletal structure after musculoskeletal procedure 07/10/2021   Left hip anteriorly     Preventative health care 04/04/2013   Primary localized osteoarthritis of right knee 10/13/2014   Snoring 05/27/2021   Squamous cell carcinoma, face 02/24/2014   Tear of medial meniscus of right knee 10/13/2014   Vitamin B12 deficiency 05/15/2013   Last Assessment & Plan:  Formatting of this note might be different from the original. Recheck level today, may need to restart monthly Vit B12 shots   Vitamin D deficiency 02/24/2014   Weight gain 04/24/2015    Past Surgical History:  Procedure Laterality Date   ABDOMINAL HYSTERECTOMY  2007   KNEE ARTHROSCOPY WITH MEDIAL  MENISECTOMY Right 10/13/2014   Procedure: RIGHT KNEE ARTHROSCOPY WITH PARTIAL MEDIAL MENISECTOMY AND CHONDROPLASTY ;  Surgeon: Teryl Lucy, MD;  Location: [facility];  Service: Orthopedics;  Laterality: Right;   TOTAL HIP ARTHROPLASTY Left 03/29/2021    Current Medications: Current Meds  Medication Sig   cyanocobalamin (VITAMIN B12) 1000 MCG/ML injection Inject 1 mL (1,000 mcg total) into the skin every 14 (fourteen) days.   valsartan (DIOVAN) 80 MG tablet Take 1 tablet (80 mg total) by mouth daily.   Vitamin D, Ergocalciferol, (DRISDOL) 1.25 MG (50000 UNIT) CAPS capsule Take 1 capsule (50,000 Units total) by mouth every 7 (seven) days.     Allergies:   Penicillins   Social History   Socioeconomic History   Marital status: Married    Spouse name: Not on file   Number of children: Not on file   Years of education: Not on file   Highest education level:  Some college, no degree  Occupational History   Not on file  Tobacco Use   Smoking status: Former    Current packs/day: 0.00    Average packs/day: 1 pack/day for 20.0 years (20.0 ttl pk-yrs)    Types: Cigarettes    Start date: 65    Quit date: 2005    Years since quitting: 20.2    Passive exposure: Never   Smokeless tobacco: Never  Vaping Use   Vaping status: Never Used  Substance and Sexual Activity   Alcohol use: Yes    Alcohol/week: 2.0 standard drinks of alcohol    Types: 1 Glasses of wine, 1 Shots of liquor per week    Comment: occ   Drug use: Never   Sexual activity: Yes    Birth control/protection: Surgical  Other Topics Concern   Not on file  Social History Narrative   Married to [place].    Social Drivers of Corporate investment banker Strain: Low Risk  (02/12/2023)   Overall Financial Resource Strain (CARDIA)    Difficulty of Paying Living Expenses: Not hard at all  Food Insecurity: No Food Insecurity (02/12/2023)   Hunger Vital Sign    Worried About Running Out of Food in the Last Year:  Never true    Ran Out of Food in the Last Year: Never true  Transportation Needs: No Transportation Needs (02/12/2023)   PRAPARE - Administrator, Civil Service (Medical): No    Lack of Transportation (Non-Medical): No  Physical Activity: Sufficiently Active (02/12/2023)   Exercise Vital Sign    Days of Exercise per Week: 4 days    Minutes of Exercise per Session: 50 min  Recent Concern: Physical Activity - Insufficiently Active (12/04/2022)   Exercise Vital Sign    Days of Exercise per Week: 3 days    Minutes of Exercise per Session: 30 min  Stress: No Stress Concern Present (02/12/2023)   [employer] of Occupational Health - Occupational Stress Questionnaire    Feeling of Stress : Only a little  Social Connections: Socially Integrated (02/12/2023)   Social Connection and Isolation Panel [NHANES]    Frequency of Communication with Friends and Family: More than three times a week    Frequency of Social Gatherings with Friends and Family: Once a week    Attends Religious Services: 1 to 4 times per year    Active Member of [employer] or Organizations: No    Attends Engineer, structural: More than 4 times per year    Marital Status: Married     Family History: The patient's family history includes Breast cancer in her maternal aunt and maternal grandmother; Breast cancer (age of onset: 9) in her mother; COPD in her father; Colon cancer in her maternal uncle; Colon polyps in her mother and sister; Diabetes in her maternal grandfather, maternal grandmother, and maternal uncle; Heart attack in her father, sister, and sister; Hyperlipidemia in her maternal aunt, maternal grandmother, mother, and sister; Hypertension in her maternal uncle and sister. There is no history of Sudden death, Esophageal cancer, Rectal cancer, or Stomach cancer.  ROS:   Please see the history of present illness.    All other systems reviewed and are negative.  EKGs/Labs/Other Studies Reviewed:     The following studies were reviewed today: .Marland KitchenEKG Interpretation Date/Time:  Tuesday April 28 2023 16:12:46 EDT Ventricular Rate:  51 PR Interval:  166 QRS Duration:  86 QT Interval:  454 QTC Calculation: 418 R Axis:  40  Text Interpretation: Sinus bradycardia Cannot rule out Anterior infarct , age undetermined When compared with ECG of 21-Sep-2022 18:20, PREVIOUS ECG IS PRESENT Confirmed by Belva Crome [phone]) on 04/28/2023 4:36:05 PM     Recent Labs: 09/16/2022: Magnesium 2.0 02/27/2023: ALT 9; BUN 17; Creat 0.76; Hemoglobin 13.1; Platelets 199; Potassium 3.8; Sodium 141; TSH 1.75  Recent Lipid Panel    Component Value Date/Time   CHOL 134 10/15/2020 0723   CHOL 185 03/07/2020 0803   TRIG 93.0 10/15/2020 0723   HDL 60.80 10/15/2020 0723   HDL 60 03/07/2020 0803   CHOLHDL 2 10/15/2020 0723   VLDL 18.6 10/15/2020 0723   LDLCALC 55 10/15/2020 0723   LDLCALC 101 (H) 03/07/2020 0803    Physical Exam:    VS:  BP 130/70   Pulse (!) 51   Ht 5' 1.6" (1.565 m)   Wt 198 lb (89.8 kg)   BMI 36.69 kg/m     Wt Readings from Last 3 Encounters:  04/28/23 198 lb (89.8 kg)  02/27/23 197 lb 3.2 oz (89.4 kg)  02/13/23 195 lb (88.5 kg)     GEN: Patient is in no acute distress HEENT: Normal NECK: No JVD; No carotid bruits LYMPHATICS: No lymphadenopathy CARDIAC: Hear sounds regular, 2/6 systolic murmur at the apex. RESPIRATORY:  Clear to auscultation without rales, wheezing or rhonchi  ABDOMEN: Soft, non-tender, non-distended MUSCULOSKELETAL:  No edema; No deformity  SKIN: Warm and dry NEUROLOGIC:  Alert and oriented x 3 PSYCHIATRIC:  Normal affect   Signed, Garwin Brothers, MD  04/28/2023 5:10 PM     [facility]

## 2023-04-29 DIAGNOSIS — L570 Actinic keratosis: Secondary | ICD-10-CM | POA: Diagnosis not present

## 2023-04-29 DIAGNOSIS — L308 Other specified dermatitis: Secondary | ICD-10-CM | POA: Diagnosis not present

## 2023-04-30 NOTE — Progress Notes (Deleted)
 Office Visit Note  Patient: Stephanie Castro             Date of Birth: Dec 30, 1965           MRN: [id number]             PCP: Sharlene Dory, DO Referring: Sharlene Dory* Visit Date: 05/14/2023 Occupation: @GUAROCC @  Subjective:  No chief complaint on file.   History of Present Illness: Stephanie Castro is a 58 y.o. female ***     Activities of Daily Living:  Patient reports morning stiffness for *** {minute/hour:19697}.   Patient {ACTIONS;DENIES/REPORTS:21021675::"Denies"} nocturnal pain.  Difficulty dressing/grooming: {ACTIONS;DENIES/REPORTS:21021675::"Denies"} Difficulty climbing stairs: {ACTIONS;DENIES/REPORTS:21021675::"Denies"} Difficulty getting out of chair: {ACTIONS;DENIES/REPORTS:21021675::"Denies"} Difficulty using hands for taps, buttons, cutlery, and/or writing: {ACTIONS;DENIES/REPORTS:21021675::"Denies"}  No Rheumatology ROS completed.   PMFS History:  Patient Active Problem List   Diagnosis Date Noted   Aortic atherosclerosis (HCC) 04/28/2023   Postoperative seroma of musculoskeletal structure after musculoskeletal procedure 07/10/2021   Localized swelling of both lower extremities 06/05/2021   Snoring 05/27/2021   Insomnia 05/27/2021   Left hip pain 12/11/2020   Obesity (BMI 35.0-39.9 without comorbidity) 11/30/2020   Class 2 severe obesity due to excess calories with serious comorbidity and body mass index (BMI) of 35.0 to 35.9 in adult (HCC) 09/14/2020   Greater trochanteric bursitis of left hip 08/30/2020   Left knee pain 08/30/2020   Elevated uric acid in blood 05/02/2020   Chronic pain of right knee 04/13/2020   Arthritis    Dysplastic nevus    Hypertension    Palpitations 10/19/2019   Cardiac murmur 10/19/2019   Lateral epicondylitis of both elbows 08/06/2018   Concern about female breast disease without diagnosis 03/17/2018   Essential hypertension 02/26/2017   Exercise-induced asthma    DOE (dyspnea on exertion) 08/08/2015    Elevated BP without diagnosis of hypertension 06/15/2015   Weight gain 04/24/2015   Primary localized osteoarthritis of right knee 10/13/2014   Tear of medial meniscus of right knee 10/13/2014   Squamous cell carcinoma, face 02/24/2014   Vitamin D deficiency 02/24/2014   Edema of extremities 07/07/2013   Fatigue 07/06/2013   GERD (gastroesophageal reflux disease) 07/06/2013   Myalgia 07/06/2013   Vitamin B12 deficiency 05/15/2013   BMI 35.0-35.9,adult 05/15/2013   Perimenopausal 05/15/2013   Low TSH level 04/22/2013   Breast cancer screening, high risk patient 04/04/2013   Preventative health care 04/04/2013   Periodic edema 04/04/2013   Other and unspecified hyperlipidemia 04/06/2011    Past Medical History:  Diagnosis Date   Arthritis    both knees , back and elbows   BMI 35.0-35.9,adult 05/15/2013   Breast cancer screening, high risk patient 04/04/2013   Pt states she had mmg 2015 nml results, ordered by Dr  Riley Nearing, [facility] health care. Results are not in Care everywhere. [facility] in [facility] [place], [place] Category 1    Cardiac murmur 10/19/2019   Chronic pain of right knee 04/13/2020   Class 2 severe obesity due to excess calories with serious comorbidity and body mass index (BMI) of 35.0 to 35.9 in adult [facility]) 09/14/2020   Concern about female breast disease without diagnosis 03/17/2018   DOE (dyspnea on exertion) 08/08/2015   Dysplastic nevus    beneath r eye   Edema of extremities 07/07/2013   Last Assessment & Plan:  Pt takes HCTZ daily.   Last Assessment & Plan:  Formatting of this note might be different from the original. Pt takes HCTZ daily.   Elevated BP without diagnosis of hypertension 06/15/2015   Elevated uric acid in blood 05/02/2020   Essential hypertension 02/26/2017   Exercise-induced asthma    Fatigue 07/06/2013   Last Assessment & Plan:  Will review  previous records from previous PCP, pt had thyroid done recently and was wnl. Pt had questions re lupus/MS and how to get tested for these.   Last Assessment & Plan:  Formatting of this note might be different from the original. Will review previous records from previous PCP, pt had thyroid done recently and was wnl. Pt had questions re lupus/MS and how to get    GERD (gastroesophageal reflux disease)    Greater trochanteric bursitis of left hip 08/30/2020   Injection given August 30, 2020   Hypertension    Insomnia 05/27/2021   Lateral epicondylitis of both elbows 08/06/2018   Left hip pain 12/11/2020   Left knee pain 08/30/2020   Localized swelling of both lower extremities 06/05/2021   Low TSH level 04/22/2013   Mixed dyslipidemia 02/22/2020   Myalgia 07/06/2013   Last Assessment & Plan:  Formatting of this note might be different from the original. Will check labs today including sed rate, ANA/RF.  Last Assessment & Plan:  Formatting of this note might be different from the original. Pt would like to be tested for lupus in the future. Has been tested for RA and was negative   Obesity (BMI 35.0-39.9 without comorbidity) 11/30/2020   Other and unspecified hyperlipidemia 04/06/2011   Palpitations 10/19/2019   Perimenopausal 05/15/2013   Periodic edema 04/04/2013   Pt reports taking "water pill" for many years due to feeling "swollen". Historically, took HCTZ but became hypokalemic. She was switched to triamterene-HCTZ with no more hypokalemic episodes.     Postoperative seroma of musculoskeletal structure after musculoskeletal procedure 07/10/2021   Left hip anteriorly     Preventative health care 04/04/2013   Primary localized osteoarthritis of right knee 10/13/2014   Snoring 05/27/2021   Squamous cell carcinoma, face 02/24/2014   Tear of medial meniscus of right knee 10/13/2014   Vitamin B12 deficiency 05/15/2013   Last Assessment & Plan:  Formatting of this note might be different from  the original. Recheck level today, may need to restart monthly Vit B12 shots   Vitamin D deficiency 02/24/2014   Weight gain 04/24/2015    Family History  Problem Relation Age of Onset   Colon polyps Mother    Hyperlipidemia Mother    Breast cancer Mother 77       breast   Heart attack Father    COPD Father    Colon polyps Sister    Heart attack Sister    Hyperlipidemia Sister    Hypertension Sister    Heart attack Sister    Hyperlipidemia Maternal Aunt    Breast cancer Maternal Aunt        breast   Hypertension Maternal Uncle    Diabetes Maternal Uncle    Colon cancer Maternal Uncle        colon   Hyperlipidemia Maternal Grandmother    Diabetes Maternal Grandmother    Breast cancer Maternal Grandmother        breast   Diabetes Maternal Grandfather    Sudden death Neg Hx    Esophageal cancer Neg Hx    Rectal cancer Neg Hx    Stomach cancer Neg Hx    Past  Surgical History:  Procedure Laterality Date   ABDOMINAL HYSTERECTOMY  2007   KNEE ARTHROSCOPY WITH MEDIAL MENISECTOMY Right 10/13/2014   Procedure: RIGHT KNEE ARTHROSCOPY WITH PARTIAL MEDIAL MENISECTOMY AND CHONDROPLASTY ;  Surgeon: Teryl Lucy, MD;  Location: [facility];  Service: Orthopedics;  Laterality: Right;   TOTAL HIP ARTHROPLASTY Left 03/29/2021   Social History   Social History Narrative   Married to [place].    Immunization History  Administered Date(s) Administered   Tdap 02/15/2008     Objective: Vital Signs: There were no vitals taken for this visit.   Physical Exam   Musculoskeletal Exam: ***  CDAI Exam: CDAI Score: -- Patient Global: --; Provider Global: -- Swollen: --; Tender: -- Joint Exam 05/14/2023   No joint exam has been documented for this visit   There is currently no information documented on the homunculus. Go to the Rheumatology activity and complete the homunculus joint exam.  Investigation: No additional findings.  Imaging: No results found.  Recent  Labs: Lab Results  Component Value Date   WBC 6.3 02/27/2023   HGB 13.1 02/27/2023   PLT 199 02/27/2023   NA 141 02/27/2023   K 3.8 02/27/2023   CL 107 02/27/2023   CO2 27 02/27/2023   GLUCOSE 87 02/27/2023   BUN 17 02/27/2023   CREATININE 0.76 02/27/2023   BILITOT 0.4 02/27/2023   ALKPHOS 69 02/13/2023   AST 16 02/27/2023   ALT 9 02/27/2023   PROT 6.6 02/27/2023   ALBUMIN 4.4 02/13/2023   CALCIUM 9.3 02/27/2023   GFRAA 99 03/07/2020    Speciality Comments: No specialty comments available.  Procedures:  No procedures performed Allergies: Penicillins   Assessment / Plan:     Visit Diagnoses: Primary osteoarthritis of both hands  Chronic pain of both shoulders  Status post total hip replacement, left  Primary osteoarthritis of both knees  Pain in both feet  Arthropathy of lumbar facet joint  Positive ANA (antinuclear antibody)  Essential hypertension  Exercise-induced asthma  History of gastroesophageal reflux (GERD)  Squamous cell carcinoma, face  Vitamin D deficiency  Vitamin B12 deficiency without anemia  Family history of rheumatoid arthritis  Orders: No orders of the defined types were placed in this encounter.  No orders of the defined types were placed in this encounter.   Face-to-face time spent with patient was *** minutes. Greater than 50% of time was spent in counseling and coordination of care.  Follow-Up Instructions: No follow-ups on file.   Gearldine Bienenstock, PA-C  Note - This record has been created using Dragon software.  Chart creation errors have been sought, but may not always  have been located. Such creation errors do not reflect on  the standard of medical care.

## 2023-05-04 ENCOUNTER — Telehealth: Admitting: Physician Assistant

## 2023-05-04 ENCOUNTER — Other Ambulatory Visit (HOSPITAL_BASED_OUTPATIENT_CLINIC_OR_DEPARTMENT_OTHER): Payer: Self-pay

## 2023-05-04 DIAGNOSIS — R300 Dysuria: Secondary | ICD-10-CM | POA: Diagnosis not present

## 2023-05-04 DIAGNOSIS — R3989 Other symptoms and signs involving the genitourinary system: Secondary | ICD-10-CM | POA: Diagnosis not present

## 2023-05-04 MED ORDER — NITROFURANTOIN MONOHYD MACRO 100 MG PO CAPS
100.0000 mg | ORAL_CAPSULE | Freq: Two times a day (BID) | ORAL | 0 refills | Status: DC
  Filled 2023-05-04: qty 10, 5d supply, fill #0

## 2023-05-04 MED ORDER — NITROFURANTOIN MONOHYD MACRO 100 MG PO CAPS
100.0000 mg | ORAL_CAPSULE | Freq: Two times a day (BID) | ORAL | 0 refills | Status: DC
  Filled 2023-05-04 (×2): qty 14, 7d supply, fill #0

## 2023-05-04 MED ORDER — PHENAZOPYRIDINE HCL 200 MG PO TABS
200.0000 mg | ORAL_TABLET | Freq: Three times a day (TID) | ORAL | 0 refills | Status: DC | PRN
  Filled 2023-05-04: qty 9, 3d supply, fill #0

## 2023-05-04 NOTE — Progress Notes (Signed)

## 2023-05-05 ENCOUNTER — Ambulatory Visit: Admitting: Family Medicine

## 2023-05-05 LAB — COMPREHENSIVE METABOLIC PANEL WITH GFR
ALT: 13 IU/L (ref 0–32)
AST: 20 IU/L (ref 0–40)
Albumin: 4.2 g/dL (ref 3.8–4.9)
Alkaline Phosphatase: 95 IU/L (ref 44–121)
BUN/Creatinine Ratio: 11 (ref 9–23)
BUN: 9 mg/dL (ref 6–24)
Bilirubin Total: 0.4 mg/dL (ref 0.0–1.2)
CO2: 20 mmol/L (ref 20–29)
Calcium: 9.2 mg/dL (ref 8.7–10.2)
Chloride: 106 mmol/L (ref 96–106)
Creatinine, Ser: 0.83 mg/dL (ref 0.57–1.00)
Globulin, Total: 2.1 g/dL (ref 1.5–4.5)
Glucose: 78 mg/dL (ref 70–99)
Potassium: 4.1 mmol/L (ref 3.5–5.2)
Sodium: 144 mmol/L (ref 134–144)
Total Protein: 6.3 g/dL (ref 6.0–8.5)
eGFR: 82 mL/min/{1.73_m2} (ref >59)

## 2023-05-05 LAB — LIPID PANEL
Chol/HDL Ratio: 2.9 ratio (ref 0.0–4.4)
Cholesterol, Total: 191 mg/dL (ref 100–199)
HDL: 65 mg/dL (ref >39)
LDL Chol Calc (NIH): 103 mg/dL — ABNORMAL HIGH (ref 0–99)
Triglycerides: 132 mg/dL (ref 0–149)
VLDL Cholesterol Cal: 23 mg/dL (ref 5–40)

## 2023-05-06 ENCOUNTER — Other Ambulatory Visit (HOSPITAL_BASED_OUTPATIENT_CLINIC_OR_DEPARTMENT_OTHER): Payer: Self-pay

## 2023-05-06 ENCOUNTER — Telehealth: Payer: Self-pay

## 2023-05-06 ENCOUNTER — Ambulatory Visit: Payer: BC Managed Care – PPO | Admitting: Family Medicine

## 2023-05-06 DIAGNOSIS — I700 Atherosclerosis of aorta: Secondary | ICD-10-CM

## 2023-05-06 DIAGNOSIS — E782 Mixed hyperlipidemia: Secondary | ICD-10-CM

## 2023-05-06 MED ORDER — ROSUVASTATIN CALCIUM 10 MG PO TABS
10.0000 mg | ORAL_TABLET | Freq: Every day | ORAL | 3 refills | Status: AC
  Filled 2023-05-06: qty 90, 90d supply, fill #0

## 2023-05-06 NOTE — Telephone Encounter (Signed)
 My Chart message sent

## 2023-05-06 NOTE — Telephone Encounter (Signed)
-----   Message from Aundra Dubin Revankar sent at 05/05/2023  5:04 PM EDT ----- Rosuva 10 an dLL 6wks. Cc pcp Garwin Brothers, MD 05/05/2023 5:04 PM

## 2023-05-07 DIAGNOSIS — F4310 Post-traumatic stress disorder, unspecified: Secondary | ICD-10-CM | POA: Diagnosis not present

## 2023-05-07 DIAGNOSIS — F329 Major depressive disorder, single episode, unspecified: Secondary | ICD-10-CM | POA: Diagnosis not present

## 2023-05-13 ENCOUNTER — Other Ambulatory Visit (HOSPITAL_BASED_OUTPATIENT_CLINIC_OR_DEPARTMENT_OTHER): Payer: Self-pay

## 2023-05-13 DIAGNOSIS — N760 Acute vaginitis: Secondary | ICD-10-CM | POA: Diagnosis not present

## 2023-05-13 DIAGNOSIS — B3731 Acute candidiasis of vulva and vagina: Secondary | ICD-10-CM | POA: Diagnosis not present

## 2023-05-13 DIAGNOSIS — R102 Pelvic and perineal pain: Secondary | ICD-10-CM | POA: Diagnosis not present

## 2023-05-13 MED ORDER — FLUCONAZOLE 150 MG PO TABS
150.0000 mg | ORAL_TABLET | ORAL | 0 refills | Status: DC
  Filled 2023-05-13: qty 3, 9d supply, fill #0

## 2023-05-13 MED ORDER — METRONIDAZOLE 500 MG PO TABS
500.0000 mg | ORAL_TABLET | Freq: Two times a day (BID) | ORAL | 0 refills | Status: DC
  Filled 2023-05-13: qty 14, 7d supply, fill #0

## 2023-05-14 ENCOUNTER — Ambulatory Visit: Payer: BC Managed Care – PPO | Admitting: Physician Assistant

## 2023-05-14 DIAGNOSIS — G8929 Other chronic pain: Secondary | ICD-10-CM

## 2023-05-14 DIAGNOSIS — M19042 Primary osteoarthritis, left hand: Secondary | ICD-10-CM

## 2023-05-14 DIAGNOSIS — Z8261 Family history of arthritis: Secondary | ICD-10-CM

## 2023-05-14 DIAGNOSIS — E559 Vitamin D deficiency, unspecified: Secondary | ICD-10-CM

## 2023-05-14 DIAGNOSIS — R768 Other specified abnormal immunological findings in serum: Secondary | ICD-10-CM

## 2023-05-14 DIAGNOSIS — C44320 Squamous cell carcinoma of skin of unspecified parts of face: Secondary | ICD-10-CM

## 2023-05-14 DIAGNOSIS — M79671 Pain in right foot: Secondary | ICD-10-CM

## 2023-05-14 DIAGNOSIS — Z8719 Personal history of other diseases of the digestive system: Secondary | ICD-10-CM

## 2023-05-14 DIAGNOSIS — J45990 Exercise induced bronchospasm: Secondary | ICD-10-CM

## 2023-05-14 DIAGNOSIS — M170 Bilateral primary osteoarthritis of knee: Secondary | ICD-10-CM

## 2023-05-14 DIAGNOSIS — M47816 Spondylosis without myelopathy or radiculopathy, lumbar region: Secondary | ICD-10-CM

## 2023-05-14 DIAGNOSIS — I10 Essential (primary) hypertension: Secondary | ICD-10-CM

## 2023-05-14 DIAGNOSIS — E538 Deficiency of other specified B group vitamins: Secondary | ICD-10-CM

## 2023-05-14 DIAGNOSIS — Z96642 Presence of left artificial hip joint: Secondary | ICD-10-CM

## 2023-05-14 NOTE — Progress Notes (Signed)
  [facility] [address] [place] [address] Phone: [phone] Subjective:   Stephanie Castro am a scribe for Dr. Felipe Horton.   I'm seeing this patient by the request  of:  Jobe Mulder, DO  CC: Knee pain  [id number]  01/08/2023 Chronic, with exacerbation.  Severe arthritic changes noted.  Discussed with patient about icing regimen and home exercises, discussed which activities to do and which ones to avoid.  Patient will be traveling and hopefully the injection will be helpful.  Will continue wear the brace and we discussed a different things over the helpful.  Follow-up again in 6 to 8 weeks.     Updated 05/20/2023 Stephanie Castro is a 58 y.o. female coming in with complaint of knee pain. Here for PRP. Patient states that she is doing well.      Objective  Blood pressure 116/70, pulse (!) 52, height 5' 1.6" (1.565 m), SpO2 99%.   General: No apparent distress alert and oriented x3 mood and affect normal, dressed appropriately.   After informed written and verbal consent, patient was seated on exam table. Right knee was prepped with alcohol swab and utilizing anterolateral approach, patient's right knee space was injected with 4 2 cc of 0.5% Marcaine  and then 5 cc of PRP patient tolerated the procedure well without immediate complications.    Impression and Recommendations:    The above documentation has been reviewed and is accurate and complete Alis Sawchuk M Tynasia Mccaul, DO

## 2023-05-20 ENCOUNTER — Ambulatory Visit: Payer: BC Managed Care – PPO | Admitting: Family Medicine

## 2023-05-20 ENCOUNTER — Encounter: Payer: Self-pay | Admitting: Family Medicine

## 2023-05-20 VITALS — BP 116/70 | HR 52 | Ht 61.6 in

## 2023-05-20 DIAGNOSIS — I700 Atherosclerosis of aorta: Secondary | ICD-10-CM | POA: Diagnosis not present

## 2023-05-20 DIAGNOSIS — M1711 Unilateral primary osteoarthritis, right knee: Secondary | ICD-10-CM

## 2023-05-20 NOTE — Patient Instructions (Signed)
No ice or IBU for 3 days Heat and Tylenol are ok See me again in 6-8 weeks 

## 2023-05-20 NOTE — Assessment & Plan Note (Signed)
 Third injection given today.  If this does not make significant improvement would recommend potential surgical intervention.  Hopeful though that this will make the bigger change.  Will follow-up again in 2 to 3 months.  Post PRP instructions given

## 2023-05-21 DIAGNOSIS — F329 Major depressive disorder, single episode, unspecified: Secondary | ICD-10-CM | POA: Diagnosis not present

## 2023-05-21 LAB — HEPATIC FUNCTION PANEL
ALT: 19 IU/L (ref 0–32)
AST: 28 IU/L (ref 0–40)
Albumin: 4.3 g/dL (ref 3.8–4.9)
Alkaline Phosphatase: 82 IU/L (ref 44–121)
Bilirubin Total: 0.4 mg/dL (ref 0.0–1.2)
Bilirubin, Direct: 0.18 mg/dL (ref 0.00–0.40)
Total Protein: 6.1 g/dL (ref 6.0–8.5)

## 2023-05-21 LAB — LIPID PANEL
Chol/HDL Ratio: 1.7 ratio (ref 0.0–4.4)
Cholesterol, Total: 109 mg/dL (ref 100–199)
HDL: 64 mg/dL (ref >39)
LDL Chol Calc (NIH): 30 mg/dL (ref 0–99)
Triglycerides: 71 mg/dL (ref 0–149)
VLDL Cholesterol Cal: 15 mg/dL (ref 5–40)

## 2023-05-22 ENCOUNTER — Other Ambulatory Visit (HOSPITAL_BASED_OUTPATIENT_CLINIC_OR_DEPARTMENT_OTHER): Payer: Self-pay

## 2023-05-22 DIAGNOSIS — A499 Bacterial infection, unspecified: Secondary | ICD-10-CM | POA: Diagnosis not present

## 2023-05-22 DIAGNOSIS — R102 Pelvic and perineal pain: Secondary | ICD-10-CM | POA: Diagnosis not present

## 2023-05-22 DIAGNOSIS — B3731 Acute candidiasis of vulva and vagina: Secondary | ICD-10-CM | POA: Diagnosis not present

## 2023-05-22 MED ORDER — FLUCONAZOLE 150 MG PO TABS
150.0000 mg | ORAL_TABLET | Freq: Every day | ORAL | 0 refills | Status: DC
  Filled 2023-05-22: qty 7, 7d supply, fill #0

## 2023-05-22 MED ORDER — CLINDAMYCIN HCL 300 MG PO CAPS
300.0000 mg | ORAL_CAPSULE | Freq: Two times a day (BID) | ORAL | 0 refills | Status: DC
  Filled 2023-05-22: qty 14, 7d supply, fill #0

## 2023-05-27 ENCOUNTER — Other Ambulatory Visit (HOSPITAL_BASED_OUTPATIENT_CLINIC_OR_DEPARTMENT_OTHER): Payer: Self-pay

## 2023-05-28 DIAGNOSIS — F4310 Post-traumatic stress disorder, unspecified: Secondary | ICD-10-CM | POA: Diagnosis not present

## 2023-05-28 DIAGNOSIS — F4320 Adjustment disorder, unspecified: Secondary | ICD-10-CM | POA: Diagnosis not present

## 2023-05-28 DIAGNOSIS — F329 Major depressive disorder, single episode, unspecified: Secondary | ICD-10-CM | POA: Diagnosis not present

## 2023-06-04 DIAGNOSIS — F329 Major depressive disorder, single episode, unspecified: Secondary | ICD-10-CM | POA: Diagnosis not present

## 2023-06-04 DIAGNOSIS — F4320 Adjustment disorder, unspecified: Secondary | ICD-10-CM | POA: Diagnosis not present

## 2023-06-04 DIAGNOSIS — F4310 Post-traumatic stress disorder, unspecified: Secondary | ICD-10-CM | POA: Diagnosis not present

## 2023-06-11 ENCOUNTER — Other Ambulatory Visit (HOSPITAL_BASED_OUTPATIENT_CLINIC_OR_DEPARTMENT_OTHER): Payer: Self-pay

## 2023-06-11 ENCOUNTER — Other Ambulatory Visit: Payer: Self-pay | Admitting: Family Medicine

## 2023-06-11 DIAGNOSIS — F4320 Adjustment disorder, unspecified: Secondary | ICD-10-CM | POA: Diagnosis not present

## 2023-06-11 DIAGNOSIS — G3184 Mild cognitive impairment, so stated: Secondary | ICD-10-CM | POA: Diagnosis not present

## 2023-06-11 DIAGNOSIS — F329 Major depressive disorder, single episode, unspecified: Secondary | ICD-10-CM | POA: Diagnosis not present

## 2023-06-11 DIAGNOSIS — E538 Deficiency of other specified B group vitamins: Secondary | ICD-10-CM

## 2023-06-11 MED ORDER — CYANOCOBALAMIN 1000 MCG/ML IJ SOLN
1000.0000 ug | INTRAMUSCULAR | 0 refills | Status: DC
  Filled 2023-06-11: qty 6, 84d supply, fill #0

## 2023-06-11 MED ORDER — VITAMIN D (ERGOCALCIFEROL) 1.25 MG (50000 UNIT) PO CAPS
50000.0000 [IU] | ORAL_CAPSULE | ORAL | 0 refills | Status: AC
Start: 2023-06-11 — End: ?
  Filled 2023-06-11: qty 12, 84d supply, fill #0

## 2023-06-30 ENCOUNTER — Other Ambulatory Visit (HOSPITAL_BASED_OUTPATIENT_CLINIC_OR_DEPARTMENT_OTHER): Payer: Self-pay

## 2023-06-30 ENCOUNTER — Ambulatory Visit: Admitting: Family Medicine

## 2023-06-30 ENCOUNTER — Encounter: Payer: Self-pay | Admitting: Family Medicine

## 2023-06-30 VITALS — BP 120/72 | HR 70 | Temp 98.0°F | Resp 16 | Ht 61.0 in | Wt 200.0 lb

## 2023-06-30 DIAGNOSIS — F411 Generalized anxiety disorder: Secondary | ICD-10-CM | POA: Diagnosis not present

## 2023-06-30 DIAGNOSIS — F4310 Post-traumatic stress disorder, unspecified: Secondary | ICD-10-CM

## 2023-06-30 MED ORDER — VENLAFAXINE HCL ER 37.5 MG PO CP24
37.5000 mg | ORAL_CAPSULE | Freq: Every day | ORAL | 2 refills | Status: DC
Start: 2023-06-30 — End: 2023-08-26
  Filled 2023-06-30: qty 30, 30d supply, fill #0

## 2023-06-30 NOTE — Patient Instructions (Signed)
 Aim to do some physical exertion for 150 minutes per week. This is typically divided into 5 days per week, 30 minutes per day. The activity should be enough to get your heart rate up. Anything is better than nothing if you have time constraints.  Let me know if there are issues.

## 2023-06-30 NOTE — Progress Notes (Signed)
 Chief Complaint  Patient presents with   Referral    Discuss Neurology Report     Subjective: Patient is a 58 y.o. female here for f/u.  Around 3 weeks ago, she had a formal evaluation by neuropsych. Dx is mild neurocog disorder, PTSD and adjustment disorder w mixed anxiety/depressed mood. Consideration of antidepressant med and Neuro referral rec'd.  No homicidal or suicidal ideation.  No self-medication.  She is having lots of stress and anxiety.  She is following with a therapist which is helpful.  She is sleeping 2 to 3 hours a night and does not feel well rested.  She is very irritable.  She has chronic joint pain, fatigue, anxiousness.  She plans to reach out to her gynecologist regarding hormonal replacement.  Past Medical History:  Diagnosis Date   Arthritis    both knees , back and elbows   BMI 35.0-35.9,adult 05/15/2013   Breast cancer screening, high risk patient 04/04/2013   Pt states she had mmg 2015 nml results, ordered by Dr  Ferna How, [facility] health care. Results are not in Care everywhere. [facility] in [facility] , BI-RADS Category 1    Cardiac murmur 10/19/2019   Chronic pain of right knee 04/13/2020   Class 2 severe obesity due to excess calories with serious comorbidity and body mass index (BMI) of 35.0 to 35.9 in adult [facility]) 09/14/2020   Concern about female breast disease without diagnosis 03/17/2018   DOE (dyspnea on exertion) 08/08/2015   Dysplastic nevus    beneath r eye   Edema of extremities 07/07/2013   Last Assessment & Plan:  Pt takes HCTZ daily.   Last Assessment & Plan:  Formatting of this note might be different from the original. Pt takes HCTZ daily.   Elevated BP without diagnosis of hypertension 06/15/2015   Elevated uric acid in blood 05/02/2020   Essential hypertension 02/26/2017   Exercise-induced asthma    Fatigue 07/06/2013   Last  Assessment & Plan:  Will review previous records from previous PCP, pt had thyroid  done recently and was wnl. Pt had questions re lupus/MS and how to get tested for these.   Last Assessment & Plan:  Formatting of this note might be different from the original. Will review previous records from previous PCP, pt had thyroid  done recently and was wnl. Pt had questions re lupus/MS and how to get    GERD (gastroesophageal reflux disease)    Greater trochanteric bursitis of left hip 08/30/2020   Injection given August 30, 2020   Hypertension    Insomnia 05/27/2021   Lateral epicondylitis of both elbows 08/06/2018   Left hip pain 12/11/2020   Left knee pain 08/30/2020   Localized swelling of both lower extremities 06/05/2021   Low TSH level 04/22/2013   Mixed dyslipidemia 02/22/2020   Myalgia 07/06/2013   Last Assessment & Plan:  Formatting of this note might be different from the original. Will check labs today including sed rate, ANA/RF.  Last Assessment & Plan:  Formatting of this note might be different from the original. Pt would like to be tested for lupus in the future. Has been tested for RA and was negative   Obesity (BMI 35.0-39.9 without comorbidity) 11/30/2020   Other and unspecified hyperlipidemia 04/06/2011   Palpitations 10/19/2019   Perimenopausal 05/15/2013   Periodic edema 04/04/2013   Pt reports taking "water pill" for many years due to feeling "swollen". Historically,  took HCTZ but became hypokalemic. She was switched to triamterene -HCTZ with no more hypokalemic episodes.     Postoperative seroma of musculoskeletal structure after musculoskeletal procedure 07/10/2021   Left hip anteriorly     Preventative health care 04/04/2013   Primary localized osteoarthritis of right knee 10/13/2014   Snoring 05/27/2021   Squamous cell carcinoma, face 02/24/2014   Tear of medial meniscus of right knee 10/13/2014   Vitamin B12 deficiency 05/15/2013   Last Assessment & Plan:  Formatting of  this note might be different from the original. Recheck level today, may need to restart monthly Vit B12 shots   Vitamin D  deficiency 02/24/2014   Weight gain 04/24/2015    Objective: BP 120/72 (BP Location: Left Arm, Patient Position: Sitting)   Pulse 70   Temp 98 F (36.7 C) (Oral)   Resp 16   Ht 5\' 1"  (1.549 m)   Wt 200 lb (90.7 kg)   SpO2 97%   BMI 37.79 kg/m  General: Awake, appears stated age Lungs: No accessory muscle use Psych: Age appropriate judgment and insight, normal affect and mood  Assessment and Plan: PTSD (post-traumatic stress disorder) - Plan: venlafaxine XR (EFFEXOR XR) 37.5 MG 24 hr capsule  GAD (generalized anxiety disorder) - Plan: venlafaxine XR (EFFEXOR XR) 37.5 MG 24 hr capsule  1/2.  Chronic, not controlled.  Start venlafaxine 37.5 mg daily.  Continue with counseling.  Exercise recommended.  Reach out to GYN regarding menopausal symptoms and hormone replacement.  Follow-up in 1 month recheck. The patient voiced understanding and agreement to the plan.  Shellie Dials [place], DO 06/30/23  10:00 AM

## 2023-07-01 ENCOUNTER — Ambulatory Visit: Admitting: Family Medicine

## 2023-07-10 DIAGNOSIS — F4310 Post-traumatic stress disorder, unspecified: Secondary | ICD-10-CM | POA: Diagnosis not present

## 2023-07-10 DIAGNOSIS — F329 Major depressive disorder, single episode, unspecified: Secondary | ICD-10-CM | POA: Diagnosis not present

## 2023-07-21 DIAGNOSIS — F4320 Adjustment disorder, unspecified: Secondary | ICD-10-CM | POA: Diagnosis not present

## 2023-07-21 DIAGNOSIS — F4310 Post-traumatic stress disorder, unspecified: Secondary | ICD-10-CM | POA: Diagnosis not present

## 2023-07-21 DIAGNOSIS — F329 Major depressive disorder, single episode, unspecified: Secondary | ICD-10-CM | POA: Diagnosis not present

## 2023-07-21 NOTE — Progress Notes (Deleted)
 [facility] [address] [place] [address] Phone: [phone] Subjective:    I'm seeing this patient by the request  of:  Frann Mabel Mt, DO  CC:   YEP:Dlagzrupcz  05/20/2023 Third injection given today.  If this does not make significant improvement would recommend potential surgical intervention.  Hopeful though that this will make the bigger change.  Will follow-up again in 2 to 3 months.  Post PRP instructions given     Updated 07/28/2023 Stephanie Castro is a 58 y.o. female coming in with complaint of right knee pain. PRP f/u.      Past Medical History:  Diagnosis Date   Arthritis    both knees , back and elbows   BMI 35.0-35.9,adult 05/15/2013   Breast cancer screening, high risk patient 04/04/2013   Pt states she had mmg 2015 nml results, ordered by Dr  Aletha, [facility] health care. Results are not in Care everywhere. [facility] in [facility] [place] , BI-RADS Category 1    Cardiac murmur 10/19/2019   Chronic pain of right knee 04/13/2020   Class 2 severe obesity due to excess calories with serious comorbidity and body mass index (BMI) of 35.0 to 35.9 in adult [facility]) 09/14/2020   Concern about female breast disease without diagnosis 03/17/2018   DOE (dyspnea on exertion) 08/08/2015   Dysplastic nevus    beneath r eye   Edema of extremities 07/07/2013   Last Assessment & Plan:  Pt takes HCTZ daily.   Last Assessment & Plan:  Formatting of this note might be different from the original. Pt takes HCTZ daily.   Elevated BP without diagnosis of hypertension 06/15/2015   Elevated uric acid in blood 05/02/2020   Essential hypertension 02/26/2017   Exercise-induced asthma    Fatigue 07/06/2013   Last Assessment & Plan:  Will review previous records from previous PCP, pt had thyroid  done recently and was wnl. Pt had questions re lupus/MS  and how to get tested for these.   Last Assessment & Plan:  Formatting of this note might be different from the original. Will review previous records from previous PCP, pt had thyroid  done recently and was wnl. Pt had questions re lupus/MS and how to get    GERD (gastroesophageal reflux disease)    Greater trochanteric bursitis of left hip 08/30/2020   Injection given August 30, 2020   Hypertension    Insomnia 05/27/2021   Lateral epicondylitis of both elbows 08/06/2018   Left hip pain 12/11/2020   Left knee pain 08/30/2020   Localized swelling of both lower extremities 06/05/2021   Low TSH level 04/22/2013   Mixed dyslipidemia 02/22/2020   Myalgia 07/06/2013   Last Assessment & Plan:  Formatting of this note might be different from the original. Will check labs today including sed rate, ANA/RF.  Last Assessment & Plan:  Formatting of this note might be different from the original. Pt would like to be tested for lupus in the future. Has been tested for RA and was negative   Obesity (BMI 35.0-39.9 without comorbidity) 11/30/2020   Other and unspecified hyperlipidemia 04/06/2011   Palpitations 10/19/2019   Perimenopausal 05/15/2013   Periodic edema 04/04/2013   Pt reports taking water pill for many years due to feeling swollen. Historically, took HCTZ but became hypokalemic. She was switched to triamterene -HCTZ with no more hypokalemic episodes.     Postoperative seroma of  musculoskeletal structure after musculoskeletal procedure 07/10/2021   Left hip anteriorly     Preventative health care 04/04/2013   Primary localized osteoarthritis of right knee 10/13/2014   Snoring 05/27/2021   Squamous cell carcinoma, face 02/24/2014   Tear of medial meniscus of right knee 10/13/2014   Vitamin B12 deficiency 05/15/2013   Last Assessment & Plan:  Formatting of this note might be different from the original. Recheck level today, may need to restart monthly Vit B12 shots   Vitamin D  deficiency  02/24/2014   Weight gain 04/24/2015   Past Surgical History:  Procedure Laterality Date   ABDOMINAL HYSTERECTOMY  2007   KNEE ARTHROSCOPY WITH MEDIAL MENISECTOMY Right 10/13/2014   Procedure: RIGHT KNEE ARTHROSCOPY WITH PARTIAL MEDIAL MENISECTOMY AND CHONDROPLASTY ;  Surgeon: Fonda Olmsted, MD;  Location: [facility];  Service: Orthopedics;  Laterality: Right;   TOTAL HIP ARTHROPLASTY Left 03/29/2021   Social History   Socioeconomic History   Marital status: Married    Spouse name: Not on file   Number of children: Not on file   Years of education: Not on file   Highest education level: Some college, no degree  Occupational History   Not on file  Tobacco Use   Smoking status: Former    Current packs/day: 0.00    Average packs/day: 1 pack/day for 20.0 years (20.0 ttl pk-yrs)    Types: Cigarettes    Start date: 40    Quit date: 2005    Years since quitting: 20.4    Passive exposure: Never   Smokeless tobacco: Never  Vaping Use   Vaping status: Never Used  Substance and Sexual Activity   Alcohol use: Yes    Alcohol/week: 2.0 standard drinks of alcohol    Types: 1 Glasses of wine, 1 Shots of liquor per week    Comment: occ   Drug use: Never   Sexual activity: Yes    Birth control/protection: Surgical  Other Topics Concern   Not on file  Social History Narrative   Married to [place].    Social Drivers of Corporate investment banker Strain: Low Risk  (02/12/2023)   Overall Financial Resource Strain (CARDIA)    Difficulty of Paying Living Expenses: Not hard at all  Food Insecurity: No Food Insecurity (02/12/2023)   Hunger Vital Sign    Worried About Running Out of Food in the Last Year: Never true    Ran Out of Food in the Last Year: Never true  Transportation Needs: No Transportation Needs (02/12/2023)   PRAPARE - Administrator, Civil Service (Medical): No    Lack of Transportation (Non-Medical): No  Physical Activity: Sufficiently Active  (02/12/2023)   Exercise Vital Sign    Days of Exercise per Week: 4 days    Minutes of Exercise per Session: 50 min  Recent Concern: Physical Activity - Insufficiently Active (12/04/2022)   Exercise Vital Sign    Days of Exercise per Week: 3 days    Minutes of Exercise per Session: 30 min  Stress: No Stress Concern Present (02/12/2023)   [employer] of Occupational Health - Occupational Stress Questionnaire    Feeling of Stress : Only a little  Social Connections: Socially Integrated (02/12/2023)   Social Connection and Isolation Panel    Frequency of Communication with Friends and Family: More than three times a week    Frequency of Social Gatherings with Friends and Family: Once a week    Attends Religious Services: 1  to 4 times per year    Active Member of Clubs or Organizations: No    Attends Engineer, structural: More than 4 times per year    Marital Status: Married   Allergies  Allergen Reactions   Penicillins    Family History  Problem Relation Age of Onset   Colon polyps Mother    Hyperlipidemia Mother    Breast cancer Mother 71       breast   Heart attack Father    COPD Father    Colon polyps Sister    Heart attack Sister    Hyperlipidemia Sister    Hypertension Sister    Heart attack Sister    Hyperlipidemia Maternal Aunt    Breast cancer Maternal Aunt        breast   Hypertension Maternal Uncle    Diabetes Maternal Uncle    Colon cancer Maternal Uncle        colon   Hyperlipidemia Maternal Grandmother    Diabetes Maternal Grandmother    Breast cancer Maternal Grandmother        breast   Diabetes Maternal Grandfather    Sudden death Neg Hx    Esophageal cancer Neg Hx    Rectal cancer Neg Hx    Stomach cancer Neg Hx      Current Outpatient Medications (Cardiovascular):    furosemide  (LASIX ) 40 MG tablet, Take 1 tablet (40 mg total) by mouth daily.   rosuvastatin  (CRESTOR ) 10 MG tablet, Take 1 tablet (10 mg total) by mouth daily.    valsartan  (DIOVAN ) 80 MG tablet, Take 1 tablet (80 mg total) by mouth daily.  Current Outpatient Medications (Respiratory):    albuterol  (VENTOLIN  HFA) 108 (90 Base) MCG/ACT inhaler, Inhale 1-2 puffs into the lungs every 6 (six) hours as needed (shortness of breath).   cetirizine  (ZYRTEC ) 10 MG tablet, Take 1 tablet (10 mg total) by mouth daily.   fluticasone  (FLONASE ) 50 MCG/ACT nasal spray, Place 2 sprays into both nostrils daily.   sodium chloride  (OCEAN) 0.65 % SOLN nasal spray, Place 1 spray into both nostrils as needed for congestion.  Current Outpatient Medications (Analgesics):    ibuprofen  (ADVIL ) 800 MG tablet, Take 1 tablet (800 mg total) by mouth every 6 (six) hours as needed for mild pain.  Current Outpatient Medications (Hematological):    cyanocobalamin  (VITAMIN B12) 1000 MCG/ML injection, Inject 1 mL (1,000 mcg total) into the skin every 14 (fourteen) days.  Current Outpatient Medications (Other):    ALPRAZolam  (XANAX ) 0.5 MG tablet, TAKE 1 TABLET AN HOUR BEFORE DENTAL APPOINTMENT. MUST HAVE DRIVER. DO NOT COMBINE WITH ALCOHOL.   ALPRAZolam  (XANAX ) 1 MG tablet, Take 1 tablet (1 mg total) by mouth 1 hour prior to dental appointment. *Must have driver to transport to and from appointment; do not combine with alcohol.*   conjugated estrogens  (PREMARIN ) vaginal cream, Insert 0.5 applicatorsful every day by vaginal route at bedtime for 30 days.   metroNIDAZOLE  (FLAGYL ) 500 MG tablet, Take 1 tablet (500 mg total) by mouth every 12 (twelve) hours for 7 days.   nitrofurantoin , macrocrystal-monohydrate, (MACROBID ) 100 MG capsule, Take 1 capsule (100 mg total) by mouth 2 (two) times daily.   nitrofurantoin , macrocrystal-monohydrate, (MACROBID ) 100 MG capsule, Take 1 capsule (100 mg total) by mouth 2 (two) times daily for 7 days.   phenazopyridine  (PYRIDIUM ) 200 MG tablet, Take 1 tablet (200 mg total) by mouth 3 (three) times daily as needed for pain.   phenazopyridine  (PYRIDIUM ) 200 MG  tablet, Take 1 tablet (200 mg total) by mouth 3 (three) times daily as needed for bladder spasms for up to 3 days.   potassium chloride  (KLOR-CON  M) 10 MEQ tablet, Take 2 tablets (20 mEq total) by mouth with every dose of lasix .   venlafaxine  XR (EFFEXOR  XR) 37.5 MG 24 hr capsule, Take 1 capsule (37.5 mg total) by mouth daily with breakfast.   Vitamin D , Ergocalciferol , (DRISDOL ) 1.25 MG (50000 UNIT) CAPS capsule, Take 1 capsule (50,000 Units total) by mouth every 7 (seven) days.   Reviewed prior external information including notes and imaging from  primary care provider As well as notes that were available from care everywhere and other healthcare systems.  Past medical history, social, surgical and family history all reviewed in electronic medical record.  No pertanent information unless stated regarding to the chief complaint.   Review of Systems:  No headache, visual changes, nausea, vomiting, diarrhea, constipation, dizziness, abdominal pain, skin rash, fevers, chills, night sweats, weight loss, swollen lymph nodes, body aches, joint swelling, chest pain, shortness of breath, mood changes. POSITIVE muscle aches  Objective  There were no vitals taken for this visit.   General: No apparent distress alert and oriented x3 mood and affect normal, dressed appropriately.  HEENT: Pupils equal, extraocular movements intact  Respiratory: Patient's speak in full sentences and does not appear short of breath  Cardiovascular: No lower extremity edema, non tender, no erythema      Impression and Recommendations:

## 2023-07-28 ENCOUNTER — Ambulatory Visit: Admitting: Family Medicine

## 2023-08-03 ENCOUNTER — Ambulatory Visit: Admitting: Family Medicine

## 2023-08-11 ENCOUNTER — Other Ambulatory Visit: Payer: Self-pay | Admitting: Family

## 2023-08-11 ENCOUNTER — Other Ambulatory Visit (HOSPITAL_BASED_OUTPATIENT_CLINIC_OR_DEPARTMENT_OTHER): Payer: Self-pay

## 2023-08-11 MED ORDER — VALSARTAN 80 MG PO TABS
80.0000 mg | ORAL_TABLET | Freq: Every day | ORAL | 0 refills | Status: AC
  Filled 2023-08-11: qty 90, 90d supply, fill #0

## 2023-08-18 DIAGNOSIS — F4310 Post-traumatic stress disorder, unspecified: Secondary | ICD-10-CM | POA: Diagnosis not present

## 2023-08-18 DIAGNOSIS — F329 Major depressive disorder, single episode, unspecified: Secondary | ICD-10-CM | POA: Diagnosis not present

## 2023-08-20 ENCOUNTER — Ambulatory Visit: Admitting: Family Medicine

## 2023-08-26 ENCOUNTER — Ambulatory Visit: Admitting: Family Medicine

## 2023-08-26 ENCOUNTER — Encounter: Payer: Self-pay | Admitting: Family Medicine

## 2023-08-26 ENCOUNTER — Other Ambulatory Visit (HOSPITAL_BASED_OUTPATIENT_CLINIC_OR_DEPARTMENT_OTHER): Payer: Self-pay

## 2023-08-26 VITALS — BP 118/74 | HR 84 | Temp 97.9°F | Resp 16 | Ht 61.0 in | Wt 192.6 lb

## 2023-08-26 DIAGNOSIS — F411 Generalized anxiety disorder: Secondary | ICD-10-CM | POA: Diagnosis not present

## 2023-08-26 DIAGNOSIS — G4700 Insomnia, unspecified: Secondary | ICD-10-CM | POA: Diagnosis not present

## 2023-08-26 DIAGNOSIS — F4310 Post-traumatic stress disorder, unspecified: Secondary | ICD-10-CM | POA: Diagnosis not present

## 2023-08-26 MED ORDER — QUETIAPINE FUMARATE 25 MG PO TABS
25.0000 mg | ORAL_TABLET | Freq: Every day | ORAL | 2 refills | Status: AC
  Filled 2023-08-26: qty 30, 30d supply, fill #0

## 2023-08-26 NOTE — Patient Instructions (Addendum)
 Stay active.   Send me a message in 2 weeks with an update with how you are doing.   If you do not hear anything about your referral in the next 1-2 weeks, call our office and ask for an update.  Let us  know if you need anything.  [facility] [address] [place], [place] [address] [phone]  [facility] [address] [place], [place] [address] [phone]  [facility] [address] [place], [place] [address] [phone]  [facility] [address], [place], [place], [phone] [address], [place], [place], [phone]  [facility] [address] [place], [place] [address] [phone]  [facility] [address] [place], [place] [phone]  [facility] [address] [place], [place] [phone]  [facility] [address] [place], [place]   [phone].   Please go online to complete a form to submit first.  [facility]  [address] [place], [place] [phone].     [facility] -  local practices located at: [address] [place], [place]. [phone]. [address] [place], [place], [place].  [phone]. [address], [place], [place].  [phone].  Contact one of these offices sooner than later as it can take 2-3 months to get a new patient appointment.

## 2023-08-26 NOTE — Progress Notes (Signed)
 Chief Complaint  Patient presents with   Follow-up    Follow Up    Subjective Stephanie Castro presents for f/u anxiety/depression.  Pt is currently being treated with Effexor  XR 37.5 mg/d.  It made her stay more awake when she took it for 3 weeks.  She also tried melatonin to help her sleep. It did not help.  She does not feel well rested.  She has high anxiety, racing thoughts, insomnia.  Meditation has not been helpful.  Reports no significant relief since treatment. No thoughts of harming self or others. No self-medication with alcohol, prescription drugs or illicit drugs. Pt is following with a counselor/psychologist.  Past Medical History:  Diagnosis Date   Arthritis    both knees , back and elbows   BMI 35.0-35.9,adult 05/15/2013   Breast cancer screening, high risk patient 04/04/2013   Pt states she had mmg 2015 nml results, ordered by Dr  Aletha, [facility] health care. Results are not in Care everywhere. Mammogram completed [facility]/12/2015 at [facility] in [facility] , BI-RADS Category 1    Cardiac murmur 10/19/2019   Chronic pain of right knee 04/13/2020   Class 2 severe obesity due to excess calories with serious comorbidity and body mass index (BMI) of 35.0 to 35.9 in adult [facility]) 09/14/2020   Concern about female breast disease without diagnosis 03/17/2018   DOE (dyspnea on exertion) 08/08/2015   Dysplastic nevus    beneath r eye   Edema of extremities 07/07/2013   Last Assessment & Plan:  Pt takes HCTZ daily.   Last Assessment & Plan:  Formatting of this note might be different from the original. Pt takes HCTZ daily.   Elevated BP without diagnosis of hypertension 06/15/2015   Elevated uric acid in blood 05/02/2020   Essential hypertension 02/26/2017   Exercise-induced asthma    Fatigue 07/06/2013   Last Assessment & Plan:  Will review previous records from previous PCP, pt had thyroid  done recently  and was wnl. Pt had questions re lupus/MS and how to get tested for these.   Last Assessment & Plan:  Formatting of this note might be different from the original. Will review previous records from previous PCP, pt had thyroid  done recently and was wnl. Pt had questions re lupus/MS and how to get    GERD (gastroesophageal reflux disease)    Greater trochanteric bursitis of left hip 08/30/2020   Injection given August 30, 2020   Hypertension    Insomnia 05/27/2021   Lateral epicondylitis of both elbows 08/06/2018   Left hip pain 12/11/2020   Left knee pain 08/30/2020   Localized swelling of both lower extremities 06/05/2021   Low TSH level 04/22/2013   Mixed dyslipidemia 02/22/2020   Myalgia 07/06/2013   Last Assessment & Plan:  Formatting of this note might be different from the original. Will check labs today including sed rate, ANA/RF.  Last Assessment & Plan:  Formatting of this note might be different from the original. Pt would like to be tested for lupus in the future. Has been tested for RA and was negative   Obesity (BMI 35.0-39.9 without comorbidity) 11/30/2020   Other and unspecified hyperlipidemia 04/06/2011   Palpitations 10/19/2019   Perimenopausal 05/15/2013   Periodic edema 04/04/2013   Pt reports taking water pill for many years due to feeling swollen. Historically, took HCTZ but became hypokalemic. She was switched to triamterene -HCTZ with no more hypokalemic episodes.  Postoperative seroma of musculoskeletal structure after musculoskeletal procedure 07/10/2021   Left hip anteriorly     Preventative health care 04/04/2013   Primary localized osteoarthritis of right knee 10/13/2014   Snoring 05/27/2021   Squamous cell carcinoma, face 02/24/2014   Tear of medial meniscus of right knee 10/13/2014   Vitamin B12 deficiency 05/15/2013   Last Assessment & Plan:  Formatting of this note might be different from the original. Recheck level today, may need to restart monthly  Vit B12 shots   Vitamin D  deficiency 02/24/2014   Weight gain 04/24/2015   Allergies as of 08/26/2023       Reactions   Penicillins         Medication List        Accurate as of August 26, 2023  8:24 AM. If you have any questions, ask your nurse or doctor.          STOP taking these medications    venlafaxine  XR 37.5 MG 24 hr capsule Commonly known as: Effexor  XR Stopped by: Mabel Mt Areanna Gengler       TAKE these medications    albuterol  108 (90 Base) MCG/ACT inhaler Commonly known as: Ventolin  HFA Inhale 1-2 puffs into the lungs every 6 (six) hours as needed (shortness of breath).   ALPRAZolam  1 MG tablet Commonly known as: XANAX  Take 1 tablet (1 mg total) by mouth 1 hour prior to dental appointment. *Must have driver to transport to and from appointment; do not combine with alcohol.*   ALPRAZolam  0.5 MG tablet Commonly known as: Xanax  TAKE 1 TABLET AN HOUR BEFORE DENTAL APPOINTMENT. MUST HAVE DRIVER. DO NOT COMBINE WITH ALCOHOL.   cetirizine  10 MG tablet Commonly known as: ZYRTEC  Take 1 tablet (10 mg total) by mouth daily.   cyanocobalamin  1000 MCG/ML injection Commonly known as: VITAMIN B12 Inject 1 mL (1,000 mcg total) into the skin every 14 (fourteen) days.   Deep Sea Nasal Spray 0.65 % nasal spray Generic drug: sodium chloride  Place 1 spray into both nostrils as needed for congestion.   fluticasone  50 MCG/ACT nasal spray Commonly known as: FLONASE  Place 2 sprays into both nostrils daily.   furosemide  40 MG tablet Commonly known as: LASIX  Take 1 tablet (40 mg total) by mouth daily.   ibuprofen  800 MG tablet Commonly known as: ADVIL  Take 1 tablet (800 mg total) by mouth every 6 (six) hours as needed for mild pain.   metroNIDAZOLE  500 MG tablet Commonly known as: FLAGYL  Take 1 tablet (500 mg total) by mouth every 12 (twelve) hours for 7 days.   nitrofurantoin  (macrocrystal-monohydrate) 100 MG capsule Commonly known as: Macrobid  Take 1  capsule (100 mg total) by mouth 2 (two) times daily.   nitrofurantoin  (macrocrystal-monohydrate) 100 MG capsule Commonly known as: MACROBID  Take 1 capsule (100 mg total) by mouth 2 (two) times daily for 7 days.   phenazopyridine  200 MG tablet Commonly known as: Pyridium  Take 1 tablet (200 mg total) by mouth 3 (three) times daily as needed for pain.   phenazopyridine  200 MG tablet Commonly known as: PYRIDIUM  Take 1 tablet (200 mg total) by mouth 3 (three) times daily as needed for bladder spasms for up to 3 days.   potassium chloride  10 MEQ tablet Commonly known as: KLOR-CON  M Take 2 tablets (20 mEq total) by mouth with every dose of lasix .   Premarin  vaginal cream Generic drug: conjugated estrogens  Insert 0.5 applicatorsful every day by vaginal route at bedtime for 30 days.   QUEtiapine  25  MG tablet Commonly known as: SEROquel  Take 1 tablet (25 mg total) by mouth at bedtime. Started by: Mabel Deward Pry   rosuvastatin  10 MG tablet Commonly known as: CRESTOR  Take 1 tablet (10 mg total) by mouth daily.   valsartan  80 MG tablet Commonly known as: DIOVAN  Take 1 tablet (80 mg total) by mouth daily.   Vitamin D  (Ergocalciferol ) 1.25 MG (50000 UNIT) Caps capsule Commonly known as: DRISDOL  Take 1 capsule (50,000 Units total) by mouth every 7 (seven) days.        Exam BP 118/74 (BP Location: Left Arm, Patient Position: Sitting)   Pulse 84   Temp 97.9 F (36.6 C) (Oral)   Resp 16   Ht 5' 1 (1.549 m)   Wt 192 lb 9.6 oz (87.4 kg)   SpO2 97%   BMI 36.39 kg/m  General:  well developed, well nourished, in no apparent distress Lungs:  No respiratory distress Psych: well oriented with normal range of affect and age-appropriate judgement/insight, alert and oriented x4.  Assessment and Plan  GAD (generalized anxiety disorder) - Plan: QUEtiapine  (SEROQUEL ) 25 MG tablet, Ambulatory referral to Psychiatry  Insomnia, unspecified type - Plan: QUEtiapine  (SEROQUEL ) 25 MG  tablet, Ambulatory referral to Psychiatry  PTSD (post-traumatic stress disorder) - Plan: QUEtiapine  (SEROQUEL ) 25 MG tablet, Ambulatory referral to Psychiatry  Chronic, not controlled.  Stop Effexor .  Start quetiapine  25 mg nightly.  Send message in 2 weeks with an update.  Refer to psychiatry for logistical reasons.  Self referral resources provided in AVS.  Counseled on exercise.  Continue with the counseling team.  Follow-up in 1 month to recheck. The patient voiced understanding and agreement to the plan.  Mabel Deward [place], DO 08/26/23 8:24 AM

## 2023-09-01 DIAGNOSIS — F4310 Post-traumatic stress disorder, unspecified: Secondary | ICD-10-CM | POA: Diagnosis not present

## 2023-09-01 DIAGNOSIS — F4320 Adjustment disorder, unspecified: Secondary | ICD-10-CM | POA: Diagnosis not present

## 2023-09-01 DIAGNOSIS — F329 Major depressive disorder, single episode, unspecified: Secondary | ICD-10-CM | POA: Diagnosis not present

## 2023-09-15 DIAGNOSIS — F329 Major depressive disorder, single episode, unspecified: Secondary | ICD-10-CM | POA: Diagnosis not present

## 2023-09-15 DIAGNOSIS — F4320 Adjustment disorder, unspecified: Secondary | ICD-10-CM | POA: Diagnosis not present

## 2023-09-15 DIAGNOSIS — F4310 Post-traumatic stress disorder, unspecified: Secondary | ICD-10-CM | POA: Diagnosis not present

## 2023-09-24 DIAGNOSIS — R339 Retention of urine, unspecified: Secondary | ICD-10-CM | POA: Diagnosis not present

## 2023-09-24 DIAGNOSIS — N814 Uterovaginal prolapse, unspecified: Secondary | ICD-10-CM | POA: Diagnosis not present

## 2023-09-29 ENCOUNTER — Encounter: Payer: Self-pay | Admitting: Sports Medicine

## 2023-09-29 DIAGNOSIS — F329 Major depressive disorder, single episode, unspecified: Secondary | ICD-10-CM | POA: Diagnosis not present

## 2023-09-29 DIAGNOSIS — F4320 Adjustment disorder, unspecified: Secondary | ICD-10-CM | POA: Diagnosis not present

## 2023-09-29 DIAGNOSIS — F4310 Post-traumatic stress disorder, unspecified: Secondary | ICD-10-CM | POA: Diagnosis not present

## 2023-10-22 DIAGNOSIS — F330 Major depressive disorder, recurrent, mild: Secondary | ICD-10-CM | POA: Diagnosis not present

## 2023-11-19 ENCOUNTER — Encounter: Payer: Self-pay | Admitting: Family Medicine

## 2023-11-19 ENCOUNTER — Ambulatory Visit: Admitting: Family Medicine

## 2023-11-19 VITALS — BP 118/78 | HR 60 | Temp 98.0°F | Resp 18 | Ht 61.0 in | Wt 190.8 lb

## 2023-11-19 DIAGNOSIS — R1013 Epigastric pain: Secondary | ICD-10-CM | POA: Diagnosis not present

## 2023-11-19 NOTE — Progress Notes (Signed)
 Chief Complaint  Patient presents with   GI Problem    X1 week, pt states having upper abdominal cramping and going into the back. Feels like she needs to have a bowel movement. Some nausea, no vomiting. Having indigestion      Subjective Stephanie Castro is a 58 y.o. female who presents with epigastric abd pain. Sharp in nature.  Symptoms began 1 week ago.  Eating flares things.  Patient has abdominal pain, vomiting, diarrhea, and nausea Patient denies fever and URI symptoms Treatment to date: Prevacid she took 2 d ago and did not help Sick contacts: none known; did recently get back from [place] 5 days prior to the start of her symptoms.   Past Medical History:  Diagnosis Date   Arthritis    both knees , back and elbows   BMI 35.0-35.9,adult 05/15/2013   Breast cancer screening, high risk patient 04/04/2013   Pt states she had mmg 2015 nml results, ordered by Dr  Aletha, [facility] health care. Results are not in Care everywhere. [facility] completed [facility]/12/2015 at [facility] in [facility] , BI-RADS Category 1    Cardiac murmur 10/19/2019   Chronic pain of right knee 04/13/2020   Class 2 severe obesity due to excess calories with serious comorbidity and body mass index (BMI) of 35.0 to 35.9 in adult 09/14/2020   Concern about female breast disease without diagnosis 03/17/2018   DOE (dyspnea on exertion) 08/08/2015   Dysplastic nevus    beneath r eye   Edema of extremities 07/07/2013   Last Assessment & Plan:  Pt takes HCTZ daily.   Last Assessment & Plan:  Formatting of this note might be different from the original. Pt takes HCTZ daily.   Elevated BP without diagnosis of hypertension 06/15/2015   Elevated uric acid in blood 05/02/2020   Essential hypertension 02/26/2017   Exercise-induced asthma    Fatigue 07/06/2013   Last Assessment & Plan:  Will review previous records from previous PCP, pt had thyroid  done  recently and was wnl. Pt had questions re lupus/MS and how to get tested for these.   Last Assessment & Plan:  Formatting of this note might be different from the original. Will review previous records from previous PCP, pt had thyroid  done recently and was wnl. Pt had questions re lupus/MS and how to get    GERD (gastroesophageal reflux disease)    Greater trochanteric bursitis of left hip 08/30/2020   Injection given August 30, 2020   Hypertension    Insomnia 05/27/2021   Lateral epicondylitis of both elbows 08/06/2018   Left hip pain 12/11/2020   Left knee pain 08/30/2020   Localized swelling of both lower extremities 06/05/2021   Low TSH level 04/22/2013   Mixed dyslipidemia 02/22/2020   Myalgia 07/06/2013   Last Assessment & Plan:  Formatting of this note might be different from the original. Will check labs today including sed rate, ANA/RF.  Last Assessment & Plan:  Formatting of this note might be different from the original. Pt would like to be tested for lupus in the future. Has been tested for RA and was negative   Obesity (BMI 35.0-39.9 without comorbidity) 11/30/2020   Other and unspecified hyperlipidemia 04/06/2011   Palpitations 10/19/2019   Perimenopausal 05/15/2013   Periodic edema 04/04/2013   Pt reports taking water pill for many years due to feeling swollen. Historically, took HCTZ but became hypokalemic. She was switched to triamterene -HCTZ  with no more hypokalemic episodes.     Postoperative seroma of musculoskeletal structure after musculoskeletal procedure 07/10/2021   Left hip anteriorly     Preventative health care 04/04/2013   Primary localized osteoarthritis of right knee 10/13/2014   Snoring 05/27/2021   Squamous cell carcinoma, face 02/24/2014   Tear of medial meniscus of right knee 10/13/2014   Vitamin B12 deficiency 05/15/2013   Last Assessment & Plan:  Formatting of this note might be different from the original. Recheck level today, may need to restart  monthly Vit B12 shots   Vitamin D  deficiency 02/24/2014   Weight gain 04/24/2015    Exam BP 118/78 (BP Location: Left Arm, Patient Position: Sitting, Cuff Size: Large)   Pulse 60   Temp 98 F (36.7 C) (Oral)   Resp 18   Ht 5' 1 (1.549 m)   Wt 190 lb 12.8 oz (86.5 kg)   SpO2 99%   BMI 36.05 kg/m  General:  well developed, well hydrated, in no apparent distress Skin:  warm, no pallor or diaphoresis, no rashes Throat/Pharynx:  lips and gingiva without lesion; tongue and uvula midline; non-inflamed pharynx; no exudates or postnasal drainage Lungs:  clear to auscultation, breath sounds equal bilaterally, no respiratory distress, no wheezes Cardio:  RRR Abdomen:  abdomen soft, + TTP in epigastric region; bowel sounds normal; no masses or organomegaly; negative Murphy's, McBurney's, Rovsing's Psych: Appropriate judgement/insight  Assessment and Plan  Dyspepsia  She will take Pepcid 20 mg twice daily in addition to Prevacid daily for the next 7 days. Avoid aggravating foods, discussed advancing diet. F/u if symptoms fail to improve, sooner if worsening. The patient voiced understanding and agreement to the plan.  Mabel [place], DO 11/19/23  10:27 AM

## 2023-11-19 NOTE — Patient Instructions (Addendum)
 Pepcid/famotidine 20 mg 1-2 times daily can help with reflux symptoms.   Take your Prevacid daily for the next 7 days.   The only lifestyle changes that have data behind them are weight loss for the overweight/obese and elevating the head of the bed. Finding out which foods/positions are triggers is important.  Let us  know if you need anything.

## 2023-12-03 DIAGNOSIS — F411 Generalized anxiety disorder: Secondary | ICD-10-CM | POA: Diagnosis not present

## 2023-12-10 DIAGNOSIS — F4310 Post-traumatic stress disorder, unspecified: Secondary | ICD-10-CM | POA: Diagnosis not present

## 2023-12-18 ENCOUNTER — Other Ambulatory Visit (HOSPITAL_BASED_OUTPATIENT_CLINIC_OR_DEPARTMENT_OTHER): Payer: Self-pay

## 2023-12-18 ENCOUNTER — Ambulatory Visit: Admitting: Family Medicine

## 2023-12-18 ENCOUNTER — Encounter: Payer: Self-pay | Admitting: Family Medicine

## 2023-12-18 VITALS — BP 126/78 | HR 68 | Temp 98.0°F | Resp 16 | Ht 61.0 in | Wt 192.2 lb

## 2023-12-18 DIAGNOSIS — K219 Gastro-esophageal reflux disease without esophagitis: Secondary | ICD-10-CM

## 2023-12-18 DIAGNOSIS — E538 Deficiency of other specified B group vitamins: Secondary | ICD-10-CM

## 2023-12-18 MED ORDER — PANTOPRAZOLE SODIUM 40 MG PO TBEC
40.0000 mg | DELAYED_RELEASE_TABLET | Freq: Every day | ORAL | 1 refills | Status: DC
  Filled 2023-12-18: qty 30, 30d supply, fill #0
  Filled 2024-01-24: qty 30, 30d supply, fill #1

## 2023-12-18 MED ORDER — CYANOCOBALAMIN 1000 MCG/ML IJ SOLN
1000.0000 ug | INTRAMUSCULAR | 1 refills | Status: AC
Start: 2023-12-18 — End: 2024-12-17
  Filled 2023-12-18: qty 6, 84d supply, fill #0

## 2023-12-18 NOTE — Patient Instructions (Signed)
 If you do not hear anything about your referral in the next 1-2 weeks, call our office and ask for an update.  The only lifestyle changes that have data behind them are weight loss for the overweight/obese and elevating the head of the bed. Finding out which foods/positions are triggers is important.  No more Prevacid. You can continue the Pepcid twice daily as needed.  Let us  know if you need anything.

## 2023-12-18 NOTE — Progress Notes (Signed)
 Chief Complaint  Patient presents with   Gastroesophageal Reflux    Reflux    GERD LINETTE Stephanie is a 58 y.o. female who presents for follow up of GERD. Takes Prevacid 30 mg daily, Pepcid 20 mg twice daily as needed.  When Stephanie Castro is on this combination, her symptoms are controlled.  When Stephanie Castro stops, anything Stephanie Castro eats or drinks, with the exception of water, will flare her symptoms including indigestion, abdominal discomfort, burning in her chest. Stephanie Castro denies bilious reflux, waterbrash, cough, dysphagia, weight loss, melena Stephanie Castro has had no increased stress compared to usual, weight gain, or dietary change.  Stephanie Castro has never had an upper endoscopy.  Past Medical History:  Diagnosis Date   Arthritis    both knees , back and elbows   BMI 35.0-35.9,adult 05/15/2013   Breast cancer screening, high risk patient 04/04/2013   Pt states Stephanie Castro had mmg 2015 nml results, ordered by Dr  Aletha, [facility] health care. Results are not in Care everywhere. Mammogram completed 06/12/2015 at [facility] in [facility] [place] , BI-RADS Category 1    Cardiac murmur 10/19/2019   Chronic pain of right knee 04/13/2020   Class 2 severe obesity due to excess calories with serious comorbidity and body mass index (BMI) of 35.0 to 35.9 in adult 09/14/2020   Concern about female breast disease without diagnosis 03/17/2018   DOE (dyspnea on exertion) 08/08/2015   Dysplastic nevus    beneath r eye   Edema of extremities 07/07/2013   Last Assessment & Plan:  Pt takes HCTZ daily.   Last Assessment & Plan:  Formatting of this note might be different from the original. Pt takes HCTZ daily.   Elevated BP without diagnosis of hypertension 06/15/2015   Elevated uric acid in blood 05/02/2020   Essential hypertension 02/26/2017   Exercise-induced asthma    Fatigue 07/06/2013   Last Assessment & Plan:  Will review previous records from previous PCP, pt had thyroid  done  recently and was wnl. Pt had questions re lupus/MS and how to get tested for these.   Last Assessment & Plan:  Formatting of this note might be different from the original. Will review previous records from previous PCP, pt had thyroid  done recently and was wnl. Pt had questions re lupus/MS and how to get    GERD (gastroesophageal reflux disease)    Greater trochanteric bursitis of left hip 08/30/2020   Injection given August 30, 2020   Hypertension    Insomnia 05/27/2021   Lateral epicondylitis of both elbows 08/06/2018   Left hip pain 12/11/2020   Left knee pain 08/30/2020   Localized swelling of both lower extremities 06/05/2021   Low TSH level 04/22/2013   Mixed dyslipidemia 02/22/2020   Myalgia 07/06/2013   Last Assessment & Plan:  Formatting of this note might be different from the original. Will check labs today including sed rate, ANA/RF.  Last Assessment & Plan:  Formatting of this note might be different from the original. Pt would like to be tested for lupus in the future. Has been tested for RA and was negative   Obesity (BMI 35.0-39.9 without comorbidity) 11/30/2020   Other and unspecified hyperlipidemia 04/06/2011   Palpitations 10/19/2019   Perimenopausal 05/15/2013   Periodic edema 04/04/2013   Pt reports taking water pill for many years due to feeling swollen. Historically, took HCTZ but became hypokalemic. Stephanie Castro was switched to triamterene -HCTZ with no more hypokalemic episodes.  Postoperative seroma of musculoskeletal structure after musculoskeletal procedure 07/10/2021   Left hip anteriorly     Preventative health care 04/04/2013   Primary localized osteoarthritis of right knee 10/13/2014   Snoring 05/27/2021   Squamous cell carcinoma, face 02/24/2014   Tear of medial meniscus of right knee 10/13/2014   Vitamin B12 deficiency 05/15/2013   Last Assessment & Plan:  Formatting of this note might be different from the original. Recheck level today, may need to restart  monthly Vit B12 shots   Vitamin D  deficiency 02/24/2014   Weight gain 04/24/2015    Exam BP 126/78 (BP Location: Left Arm, Patient Position: Sitting)   Pulse 68   Temp 98 F (36.7 C) (Oral)   Resp 16   Ht 5' 1 (1.549 m)   Wt 192 lb 3.2 oz (87.2 kg)   SpO2 98%   BMI 36.32 kg/m  General:  well developed, well nourished, in no apparent distress Lungs:  clear to auscultation, breath sounds equal bilaterally, no respiratory distress, no wheezes Cardio:  regular rate and rhythm without murmurs, heart sounds without clicks or rubs Abdomen:  abdomen soft, mild TTP in the epigastric region; bowel sounds normal; no masses or organomegaly Negative Murphy's, McBurney's, Rovsing's, Carnett's Psych: Normal affect and mood  Assessment and Plan  Gastroesophageal reflux disease, unspecified whether esophagitis present - Plan: Ambulatory referral to Gastroenterology  Vitamin B12 deficiency (non anemic) - Plan: cyanocobalamin  (VITAMIN B12) 1000 MCG/ML injection  Chronic, not controlled.  Change Prevacid to Protonix  40 mg daily.  Refer to gastroenterology for their opinion.  Discussed reflux precautions.  Okay to use Pepcid as needed. Pt voiced understanding and agreement to the plan.  Mabel Mt [place], DO 12/18/23  8:02 AM

## 2023-12-30 NOTE — Progress Notes (Deleted)
 Stephanie Castro [facility] [address] [place] [address] Phone: [phone] Subjective:    I'm seeing this patient by the request  of:  Frann Mabel Mt, DO  CC:   YEP:Dlagzrupcz  05/20/2023 Third injection given today.  If this does not make significant improvement would recommend potential surgical intervention.  Hopeful though that this will make the bigger change.  Will follow-up again in 2 to 3 months.  Post PRP instructions given      Update 01/05/2024 Stephanie Castro is a 58 y.o. female coming in with complaint of R knee pain. Patient states        Past Medical History:  Diagnosis Date   Arthritis    both knees , back and elbows   BMI 35.0-35.9,adult 05/15/2013   Breast cancer screening, high risk patient 04/04/2013   Pt states she had mmg 2015 nml results, ordered by Dr  Aletha, [facility] health care. Results are not in Care everywhere. [facility] in [facility] [place] , BI-RADS Category 1    Cardiac murmur 10/19/2019   Chronic pain of right knee 04/13/2020   Class 2 severe obesity due to excess calories with serious comorbidity and body mass index (BMI) of 35.0 to 35.9 in adult 09/14/2020   Concern about female breast disease without diagnosis 03/17/2018   DOE (dyspnea on exertion) 08/08/2015   Dysplastic nevus    beneath r eye   Edema of extremities 07/07/2013   Last Assessment & Plan:  Pt takes HCTZ daily.   Last Assessment & Plan:  Formatting of this note might be different from the original. Pt takes HCTZ daily.   Elevated BP without diagnosis of hypertension 06/15/2015   Elevated uric acid in blood 05/02/2020   Essential hypertension 02/26/2017   Exercise-induced asthma    Fatigue 07/06/2013   Last Assessment & Plan:  Will review previous records from previous PCP, pt had thyroid  done recently and was wnl. Pt had questions re lupus/MS  and how to get tested for these.   Last Assessment & Plan:  Formatting of this note might be different from the original. Will review previous records from previous PCP, pt had thyroid  done recently and was wnl. Pt had questions re lupus/MS and how to get    GERD (gastroesophageal reflux disease)    Greater trochanteric bursitis of left hip 08/30/2020   Injection given August 30, 2020   Hypertension    Insomnia 05/27/2021   Lateral epicondylitis of both elbows 08/06/2018   Left hip pain 12/11/2020   Left knee pain 08/30/2020   Localized swelling of both lower extremities 06/05/2021   Low TSH level 04/22/2013   Mixed dyslipidemia 02/22/2020   Myalgia 07/06/2013   Last Assessment & Plan:  Formatting of this note might be different from the original. Will check labs today including sed rate, ANA/RF.  Last Assessment & Plan:  Formatting of this note might be different from the original. Pt would like to be tested for lupus in the future. Has been tested for RA and was negative   Obesity (BMI 35.0-39.9 without comorbidity) 11/30/2020   Other and unspecified hyperlipidemia 04/06/2011   Palpitations 10/19/2019   Perimenopausal 05/15/2013   Periodic edema 04/04/2013   Pt reports taking water pill for many years due to feeling swollen. Historically, took HCTZ but became hypokalemic. She was switched to triamterene -HCTZ with no more hypokalemic episodes.     Postoperative  seroma of musculoskeletal structure after musculoskeletal procedure 07/10/2021   Left hip anteriorly     Preventative health care 04/04/2013   Primary localized osteoarthritis of right knee 10/13/2014   Snoring 05/27/2021   Squamous cell carcinoma, face 02/24/2014   Tear of medial meniscus of right knee 10/13/2014   Vitamin B12 deficiency 05/15/2013   Last Assessment & Plan:  Formatting of this note might be different from the original. Recheck level today, may need to restart monthly Vit B12 shots   Vitamin D  deficiency  02/24/2014   Weight gain 04/24/2015   Past Surgical History:  Procedure Laterality Date   ABDOMINAL HYSTERECTOMY  2007   KNEE ARTHROSCOPY WITH MEDIAL MENISECTOMY Right 10/13/2014   Procedure: RIGHT KNEE ARTHROSCOPY WITH PARTIAL MEDIAL MENISECTOMY AND CHONDROPLASTY ;  Surgeon: Fonda Olmsted, MD;  Location: [facility];  Service: Orthopedics;  Laterality: Right;   TOTAL HIP ARTHROPLASTY Left 03/29/2021   Social History   Socioeconomic History   Marital status: Married    Spouse name: Not on file   Number of children: Not on file   Years of education: Not on file   Highest education level: Some college, no degree  Occupational History   Not on file  Tobacco Use   Smoking status: Former    Current packs/day: 0.00    Average packs/day: 1 pack/day for 20.0 years (20.0 ttl pk-yrs)    Types: Cigarettes    Start date: 1    Quit date: 2005    Years since quitting: 20.9    Passive exposure: Never   Smokeless tobacco: Never  Vaping Use   Vaping status: Never Used  Substance and Sexual Activity   Alcohol use: Yes    Alcohol/week: 2.0 standard drinks of alcohol    Types: 1 Glasses of wine, 1 Shots of liquor per week    Comment: occ   Drug use: Never   Sexual activity: Yes    Birth control/protection: Surgical  Other Topics Concern   Not on file  Social History Narrative   Married to [place].    Social Drivers of Corporate Investment Banker Strain: Low Risk  (02/12/2023)   Overall Financial Resource Strain (CARDIA)    Difficulty of Paying Living Expenses: Not hard at all  Food Insecurity: No Food Insecurity (02/12/2023)   Hunger Vital Sign    Worried About Running Out of Food in the Last Year: Never true    Ran Out of Food in the Last Year: Never true  Transportation Needs: No Transportation Needs (02/12/2023)   PRAPARE - Administrator, Civil Service (Medical): No    Lack of Transportation (Non-Medical): No  Physical Activity: Sufficiently Active  (02/12/2023)   Exercise Vital Sign    Days of Exercise per Week: 4 days    Minutes of Exercise per Session: 50 min  Recent Concern: Physical Activity - Insufficiently Active (12/04/2022)   Exercise Vital Sign    Days of Exercise per Week: 3 days    Minutes of Exercise per Session: 30 min  Stress: No Stress Concern Present (02/12/2023)   [employer] of Occupational Health - Occupational Stress Questionnaire    Feeling of Stress : Only a little  Social Connections: Socially Integrated (02/12/2023)   Social Connection and Isolation Panel    Frequency of Communication with Friends and Family: More than three times a week    Frequency of Social Gatherings with Friends and Family: Once a week    Attends Religious  Services: 1 to 4 times per year    Active Member of Clubs or Organizations: No    Attends Engineer, Structural: More than 4 times per year    Marital Status: Married   Allergies  Allergen Reactions   Penicillins    Family History  Problem Relation Age of Onset   Colon polyps Mother    Hyperlipidemia Mother    Breast cancer Mother 36       breast   Heart attack Father    COPD Father    Colon polyps Sister    Heart attack Sister    Hyperlipidemia Sister    Hypertension Sister    Heart attack Sister    Hyperlipidemia Maternal Aunt    Breast cancer Maternal Aunt        breast   Hypertension Maternal Uncle    Diabetes Maternal Uncle    Colon cancer Maternal Uncle        colon   Hyperlipidemia Maternal Grandmother    Diabetes Maternal Grandmother    Breast cancer Maternal Grandmother        breast   Diabetes Maternal Grandfather    Sudden death Neg Hx    Esophageal cancer Neg Hx    Rectal cancer Neg Hx    Stomach cancer Neg Hx      Current Outpatient Medications (Cardiovascular):    furosemide  (LASIX ) 40 MG tablet, Take 1 tablet (40 mg total) by mouth daily.   rosuvastatin  (CRESTOR ) 10 MG tablet, Take 1 tablet (10 mg total) by mouth daily.    valsartan  (DIOVAN ) 80 MG tablet, Take 1 tablet (80 mg total) by mouth daily.  Current Outpatient Medications (Respiratory):    albuterol  (VENTOLIN  HFA) 108 (90 Base) MCG/ACT inhaler, Inhale 1-2 puffs into the lungs every 6 (six) hours as needed (shortness of breath).   cetirizine  (ZYRTEC ) 10 MG tablet, Take 1 tablet (10 mg total) by mouth daily.   fluticasone  (FLONASE ) 50 MCG/ACT nasal spray, Place 2 sprays into both nostrils daily.   sodium chloride  (OCEAN) 0.65 % SOLN nasal spray, Place 1 spray into both nostrils as needed for congestion.   Current Outpatient Medications (Hematological):    cyanocobalamin  (VITAMIN B12) 1000 MCG/ML injection, Inject 1 mL (1,000 mcg total) into the skin every 14 (fourteen) days.  Current Outpatient Medications (Other):    ALPRAZolam  (XANAX ) 0.5 MG tablet, TAKE 1 TABLET AN HOUR BEFORE DENTAL APPOINTMENT. MUST HAVE DRIVER. DO NOT COMBINE WITH ALCOHOL.   ALPRAZolam  (XANAX ) 1 MG tablet, Take 1 tablet (1 mg total) by mouth 1 hour prior to dental appointment. *Must have driver to transport to and from appointment; do not combine with alcohol.*   conjugated estrogens  (PREMARIN ) vaginal cream, Insert 0.5 applicatorsful every day by vaginal route at bedtime for 30 days.   pantoprazole  (PROTONIX ) 40 MG tablet, Take 1 tablet (40 mg total) by mouth daily.   potassium chloride  (KLOR-CON  M) 10 MEQ tablet, Take 2 tablets (20 mEq total) by mouth with every dose of lasix .   QUEtiapine  (SEROQUEL ) 25 MG tablet, Take 1 tablet (25 mg total) by mouth at bedtime.   Vitamin D , Ergocalciferol , (DRISDOL ) 1.25 MG (50000 UNIT) CAPS capsule, Take 1 capsule (50,000 Units total) by mouth every 7 (seven) days.   Reviewed prior external information including notes and imaging from  primary care provider As well as notes that were available from care everywhere and other healthcare systems.  Past medical history, social, surgical and family history all reviewed in electronic medical  record.   No pertanent information unless stated regarding to the chief complaint.   Review of Systems:  No headache, visual changes, nausea, vomiting, diarrhea, constipation, dizziness, abdominal pain, skin rash, fevers, chills, night sweats, weight loss, swollen lymph nodes, body aches, joint swelling, chest pain, shortness of breath, mood changes. POSITIVE muscle aches  Objective  There were no vitals taken for this visit.   General: No apparent distress alert and oriented x3 mood and affect normal, dressed appropriately.  HEENT: Pupils equal, extraocular movements intact  Respiratory: Patient's speak in full sentences and does not appear short of breath  Cardiovascular: No lower extremity edema, non tender, no erythema      Impression and Recommendations:

## 2024-01-04 ENCOUNTER — Encounter: Payer: Self-pay | Admitting: Physician Assistant

## 2024-01-04 ENCOUNTER — Encounter: Payer: Self-pay | Admitting: Family Medicine

## 2024-01-05 ENCOUNTER — Ambulatory Visit: Admitting: Family Medicine

## 2024-01-07 NOTE — Progress Notes (Unsigned)
 Darlyn Claudene [facility] [address] [place] [address] Phone: [phone] Subjective:   ISusannah Gully, am serving as a scribe for Dr. Arthea Claudene.  I'm seeing this patient by the request  of:  Frann Mabel Mt, DO  CC: Right knee pain  YEP:Dlagzrupcz  05/20/2023 Third injection given today.  If this does not make significant improvement would recommend potential surgical intervention.  Hopeful though that this will make the bigger change.  Will follow-up again in 2 to 3 months.  Post PRP instructions given      Update 01/11/2024 LEONI GOODNESS is a 58 y.o. female coming in with complaint of R knee pain. Patient states has been getting pretty bad lately. R hip also giving her a problem. Injections haven't seemed to help       Past Medical History:  Diagnosis Date   Arthritis    both knees , back and elbows   BMI 35.0-35.9,adult 05/15/2013   Breast cancer screening, high risk patient 04/04/2013   Pt states she had mmg 2015 nml results, ordered by Dr  Aletha, [facility] health care. Results are not in Care everywhere. [facility] in [facility] [place] [facility] , BI-RADS Category 1    Cardiac murmur 10/19/2019   Chronic pain of right knee 04/13/2020   Class 2 severe obesity due to excess calories with serious comorbidity and body mass index (BMI) of 35.0 to 35.9 in adult 09/14/2020   Concern about female breast disease without diagnosis 03/17/2018   DOE (dyspnea on exertion) 08/08/2015   Dysplastic nevus    beneath r eye   Edema of extremities 07/07/2013   Last Assessment & Plan:  Pt takes HCTZ daily.   Last Assessment & Plan:  Formatting of this note might be different from the original. Pt takes HCTZ daily.   Elevated BP without diagnosis of hypertension 06/15/2015   Elevated uric acid in blood 05/02/2020   Essential hypertension 02/26/2017    Exercise-induced asthma    Fatigue 07/06/2013   Last Assessment & Plan:  Will review previous records from previous PCP, pt had thyroid  done recently and was wnl. Pt had questions re lupus/MS and how to get tested for these.   Last Assessment & Plan:  Formatting of this note might be different from the original. Will review previous records from previous PCP, pt had thyroid  done recently and was wnl. Pt had questions re lupus/MS and how to get    GERD (gastroesophageal reflux disease)    Greater trochanteric bursitis of left hip 08/30/2020   Injection given August 30, 2020   Hypertension    Insomnia 05/27/2021   Lateral epicondylitis of both elbows 08/06/2018   Left hip pain 12/11/2020   Left knee pain 08/30/2020   Localized swelling of both lower extremities 06/05/2021   Low TSH level 04/22/2013   Mixed dyslipidemia 02/22/2020   Myalgia 07/06/2013   Last Assessment & Plan:  Formatting of this note might be different from the original. Will check labs today including sed rate, ANA/RF.  Last Assessment & Plan:  Formatting of this note might be different from the original. Pt would like to be tested for lupus in the future. Has been tested for RA and was negative   Obesity (BMI 35.0-39.9 without comorbidity) 11/30/2020   Other and unspecified hyperlipidemia 04/06/2011   Palpitations 10/19/2019   Perimenopausal 05/15/2013   Periodic edema 04/04/2013   Pt reports  taking water pill for many years due to feeling swollen. Historically, took HCTZ but became hypokalemic. She was switched to triamterene -HCTZ with no more hypokalemic episodes.     Postoperative seroma of musculoskeletal structure after musculoskeletal procedure 07/10/2021   Left hip anteriorly     Preventative health care 04/04/2013   Primary localized osteoarthritis of right knee 10/13/2014   Snoring 05/27/2021   Squamous cell carcinoma, face 02/24/2014   Tear of medial meniscus of right knee 10/13/2014   Vitamin B12 deficiency  05/15/2013   Last Assessment & Plan:  Formatting of this note might be different from the original. Recheck level today, may need to restart monthly Vit B12 shots   Vitamin D  deficiency 02/24/2014   Weight gain 04/24/2015   Past Surgical History:  Procedure Laterality Date   ABDOMINAL HYSTERECTOMY  2007   KNEE ARTHROSCOPY WITH MEDIAL MENISECTOMY Right 10/13/2014   Procedure: RIGHT KNEE ARTHROSCOPY WITH PARTIAL MEDIAL MENISECTOMY AND CHONDROPLASTY ;  Surgeon: Fonda Olmsted, MD;  Location: [facility];  Service: Orthopedics;  Laterality: Right;   TOTAL HIP ARTHROPLASTY Left 03/29/2021   Social History   Socioeconomic History   Marital status: Married    Spouse name: Not on file   Number of children: Not on file   Years of education: Not on file   Highest education level: Some college, no degree  Occupational History   Not on file  Tobacco Use   Smoking status: Former    Current packs/day: 0.00    Average packs/day: 1 pack/day for 20.0 years (20.0 ttl pk-yrs)    Types: Cigarettes    Start date: 24    Quit date: 2005    Years since quitting: 20.9    Passive exposure: Never   Smokeless tobacco: Never  Vaping Use   Vaping status: Never Used  Substance and Sexual Activity   Alcohol use: Yes    Alcohol/week: 2.0 standard drinks of alcohol    Types: 1 Glasses of wine, 1 Shots of liquor per week    Comment: occ   Drug use: Never   Sexual activity: Yes    Birth control/protection: Surgical  Other Topics Concern   Not on file  Social History Narrative   Married to [place].    Social Drivers of Corporate Investment Banker Strain: Low Risk  (02/12/2023)   Overall Financial Resource Strain (CARDIA)    Difficulty of Paying Living Expenses: Not hard at all  Food Insecurity: No Food Insecurity (02/12/2023)   Hunger Vital Sign    Worried About Running Out of Food in the Last Year: Never true    Ran Out of Food in the Last Year: Never true  Transportation Needs: No  Transportation Needs (02/12/2023)   PRAPARE - Administrator, Civil Service (Medical): No    Lack of Transportation (Non-Medical): No  Physical Activity: Sufficiently Active (02/12/2023)   Exercise Vital Sign    Days of Exercise per Week: 4 days    Minutes of Exercise per Session: 50 min  Recent Concern: Physical Activity - Insufficiently Active (12/04/2022)   Exercise Vital Sign    Days of Exercise per Week: 3 days    Minutes of Exercise per Session: 30 min  Stress: No Stress Concern Present (02/12/2023)   [employer] of Occupational Health - Occupational Stress Questionnaire    Feeling of Stress : Only a little  Social Connections: Socially Integrated (02/12/2023)   Social Connection and Isolation Panel    Frequency  of Communication with Friends and Family: More than three times a week    Frequency of Social Gatherings with Friends and Family: Once a week    Attends Religious Services: 1 to 4 times per year    Active Member of [employer] or Organizations: No    Attends Engineer, Structural: More than 4 times per year    Marital Status: Married   Allergies  Allergen Reactions   Penicillins    Family History  Problem Relation Age of Onset   Colon polyps Mother    Hyperlipidemia Mother    Breast cancer Mother 42       breast   Heart attack Father    COPD Father    Colon polyps Sister    Heart attack Sister    Hyperlipidemia Sister    Hypertension Sister    Heart attack Sister    Hyperlipidemia Maternal Aunt    Breast cancer Maternal Aunt        breast   Hypertension Maternal Uncle    Diabetes Maternal Uncle    Colon cancer Maternal Uncle        colon   Hyperlipidemia Maternal Grandmother    Diabetes Maternal Grandmother    Breast cancer Maternal Grandmother        breast   Diabetes Maternal Grandfather    Sudden death Neg Hx    Esophageal cancer Neg Hx    Rectal cancer Neg Hx    Stomach cancer Neg Hx      Current Outpatient Medications  (Cardiovascular):    furosemide  (LASIX ) 40 MG tablet, Take 1 tablet (40 mg total) by mouth daily.   rosuvastatin  (CRESTOR ) 10 MG tablet, Take 1 tablet (10 mg total) by mouth daily.   valsartan  (DIOVAN ) 80 MG tablet, Take 1 tablet (80 mg total) by mouth daily.  Current Outpatient Medications (Respiratory):    albuterol  (VENTOLIN  HFA) 108 (90 Base) MCG/ACT inhaler, Inhale 1-2 puffs into the lungs every 6 (six) hours as needed (shortness of breath).   cetirizine  (ZYRTEC ) 10 MG tablet, Take 1 tablet (10 mg total) by mouth daily.   fluticasone  (FLONASE ) 50 MCG/ACT nasal spray, Place 2 sprays into both nostrils daily.   sodium chloride  (OCEAN) 0.65 % SOLN nasal spray, Place 1 spray into both nostrils as needed for congestion.   Current Outpatient Medications (Hematological):    cyanocobalamin  (VITAMIN B12) 1000 MCG/ML injection, Inject 1 mL (1,000 mcg total) into the skin every 14 (fourteen) days.  Current Outpatient Medications (Other):    ALPRAZolam  (XANAX ) 0.5 MG tablet, TAKE 1 TABLET AN HOUR BEFORE DENTAL APPOINTMENT. MUST HAVE DRIVER. DO NOT COMBINE WITH ALCOHOL.   ALPRAZolam  (XANAX ) 1 MG tablet, Take 1 tablet (1 mg total) by mouth 1 hour prior to dental appointment. *Must have driver to transport to and from appointment; do not combine with alcohol.*   conjugated estrogens  (PREMARIN ) vaginal cream, Insert 0.5 applicatorsful every day by vaginal route at bedtime for 30 days.   pantoprazole  (PROTONIX ) 40 MG tablet, Take 1 tablet (40 mg total) by mouth daily.   potassium chloride  (KLOR-CON  M) 10 MEQ tablet, Take 2 tablets (20 mEq total) by mouth with every dose of lasix .   QUEtiapine  (SEROQUEL ) 25 MG tablet, Take 1 tablet (25 mg total) by mouth at bedtime.   Vitamin D , Ergocalciferol , (DRISDOL ) 1.25 MG (50000 UNIT) CAPS capsule, Take 1 capsule (50,000 Units total) by mouth every 7 (seven) days.   Reviewed prior external information including notes and imaging from  primary care provider As  well as notes that were available from care everywhere and other healthcare systems.  Past medical history, social, surgical and family history all reviewed in electronic medical record.  No pertanent information unless stated regarding to the chief complaint.   Review of Systems:  No headache, visual changes, nausea, vomiting, diarrhea, constipation, dizziness, abdominal pain, skin rash, fevers, chills, night sweats, weight loss, swollen lymph nodes, body aches, joint swelling, chest pain, shortness of breath, mood changes. POSITIVE muscle aches  Objective  Blood pressure 128/86, pulse (!) 52, height 5' 1 (1.549 m), weight 194 lb (88 kg), SpO2 99%.    General: No apparent distress alert and oriented x3 mood and affect normal, dressed appropriately.  HEENT: Pupils equal, extraocular movements intact  Respiratory: Patient's speak in full sentences and does not appear short of breath  Cardiovascular: No lower extremity edema, non tender, no erythema  Right knee exam shows arthritic changes noted.  Trace effusion noted.  Limited range of motion.  Patient also has fairly severe tenderness to palpation over the right greater trochanteric area.  Positive FABER test on the right side.  Negative straight leg test noted.   After verbal consent patient was prepped with alcohol swab and with a 21-gauge 2 inch needle injected into the right greater trochanteric area with 2 cc of 0.5% Marcaine  and 1 cc of Kenalog  40 mg/mL.  No blood loss.  Band-Aid placed.  Postinjection instructions given  After informed written and verbal consent, patient was seated on exam table. Right knee was prepped with alcohol swab and utilizing anterolateral approach, patient's right knee space was injected with 4:1  marcaine  0.5%: Kenalog  40mg /dL. Patient tolerated the procedure well without immediate complications.    Impression and Recommendations:     The above documentation has been reviewed and is accurate and complete  Susy Placzek M Kennadie Brenner, DO

## 2024-01-11 ENCOUNTER — Ambulatory Visit: Admitting: Family Medicine

## 2024-01-11 ENCOUNTER — Encounter: Payer: Self-pay | Admitting: Family Medicine

## 2024-01-11 VITALS — BP 128/86 | HR 52 | Ht 61.0 in | Wt 194.0 lb

## 2024-01-11 DIAGNOSIS — M7061 Trochanteric bursitis, right hip: Secondary | ICD-10-CM

## 2024-01-11 DIAGNOSIS — M1711 Unilateral primary osteoarthritis, right knee: Secondary | ICD-10-CM

## 2024-01-11 NOTE — Assessment & Plan Note (Signed)
 Chronic problem with worsening symptoms.  Discussed icing regimen and home exercises, which activities to do and which ones to avoid.  Increase activity.  Discussed the importance of core strengthening weight loss.  Follow-up again in 6 to 8 weeks

## 2024-01-11 NOTE — Patient Instructions (Signed)
 Injected R knee and R hip See me again in

## 2024-01-11 NOTE — Assessment & Plan Note (Signed)
 Repeat injection given again today.  Tolerated the procedure well.  Hopeful that this will make a significant improvement.  Increase activity slowly.  Discussed icing regimen.  Follow-up again in 6 to 8 weeks.  Patient did not respond as well to the PRP.  Will wear the brace occasionally.

## 2024-01-12 ENCOUNTER — Other Ambulatory Visit: Payer: Self-pay

## 2024-01-12 ENCOUNTER — Ambulatory Visit: Admitting: Obstetrics and Gynecology

## 2024-01-12 ENCOUNTER — Other Ambulatory Visit (HOSPITAL_COMMUNITY)
Admission: RE | Admit: 2024-01-12 | Discharge: 2024-01-12 | Disposition: A | Source: Other Acute Inpatient Hospital | Attending: Obstetrics and Gynecology | Admitting: Obstetrics and Gynecology

## 2024-01-12 ENCOUNTER — Other Ambulatory Visit (HOSPITAL_BASED_OUTPATIENT_CLINIC_OR_DEPARTMENT_OTHER): Payer: Self-pay

## 2024-01-12 ENCOUNTER — Encounter: Payer: Self-pay | Admitting: Obstetrics and Gynecology

## 2024-01-12 VITALS — BP 155/80 | HR 56 | Ht 62.0 in | Wt 192.0 lb

## 2024-01-12 DIAGNOSIS — M62838 Other muscle spasm: Secondary | ICD-10-CM | POA: Diagnosis not present

## 2024-01-12 DIAGNOSIS — R319 Hematuria, unspecified: Secondary | ICD-10-CM | POA: Insufficient documentation

## 2024-01-12 DIAGNOSIS — R350 Frequency of micturition: Secondary | ICD-10-CM | POA: Diagnosis not present

## 2024-01-12 DIAGNOSIS — N393 Stress incontinence (female) (male): Secondary | ICD-10-CM | POA: Insufficient documentation

## 2024-01-12 LAB — POCT URINE DIPSTICK
Bilirubin, UA: NEGATIVE
Glucose, UA: NEGATIVE mg/dL
Ketones, POC UA: NEGATIVE mg/dL
Leukocytes, UA: NEGATIVE
Nitrite, UA: NEGATIVE
POC PROTEIN,UA: NEGATIVE
Spec Grav, UA: 1.015 (ref 1.010–1.025)
Urobilinogen, UA: 0.2 U/dL
pH, UA: 7 (ref 5.0–8.0)

## 2024-01-12 LAB — URINALYSIS, COMPLETE (UACMP) WITH MICROSCOPIC
Bilirubin Urine: NEGATIVE
Glucose, UA: NEGATIVE mg/dL
Hgb urine dipstick: NEGATIVE
Ketones, ur: NEGATIVE mg/dL
Nitrite: NEGATIVE
Protein, ur: NEGATIVE mg/dL
Specific Gravity, Urine: <1.005 — ABNORMAL LOW (ref 1.005–1.030)
pH: 6.5 (ref 5.0–8.0)

## 2024-01-12 MED ORDER — DIAZEPAM 5 MG PO TABS
ORAL_TABLET | ORAL | 0 refills | Status: AC
  Filled 2024-01-12: qty 30, 30d supply, fill #0
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
## 2024-01-12 NOTE — Patient Instructions (Addendum)
 Today we talked about ways to manage bladder urgency such as altering your diet to avoid irritative beverages and foods (bladder diet) as well as attempting to decrease stress and other exacerbating factors.    The Most Bothersome Foods* The Least Bothersome Foods*  Coffee - Regular & Decaf Tea - caffeinated Carbonated beverages - cola, non-colas, diet & caffeine-free Alcohols - Beer, Red Wine, White Wine, [address] - Grapefruit, [place], Orange, [employer] - Cranberry, Grapefruit, Orange, Pineapple Vegetables - Tomato & Tomato Products Flavor Enhancers - Hot peppers, Spicy foods, Chili, Horseradish, Vinegar, Monosodium glutamate (MSG) Artificial Sweeteners - NutraSweet, Sweet 'N Low, Equal (sweetener), Saccharin Ethnic foods - Mexican, Thai, Indian food [employer] - low-fat & whole Fruits - Bananas, Blueberries, Honeydew melon, Pears, Raisins, Watermelon Vegetables - Broccoli, [address] Sprouts, [place], Carrots, Cauliflower, [place], Cucumber, Mushrooms, Peas, Radishes, Squash, Zucchini, White potatoes, Sweet potatoes & yams Poultry - Chicken, Eggs, Turkey, [employer] - Beef, Diplomatic Services Operational Officer, Lamb Seafood - Shrimp, [place] fish, Salmon Grains - Oat, Rice Snacks - Pretzels, Popcorn  *Mitch ALF et al. Diet and its role in interstitial cystitis/bladder pain syndrome (IC/BPS) and comorbid conditions. [employer]. BJU Int. 2012 Jan 11.   I will prescribe valium 5 mg pills to place vaginally up to 2 times a day for vaginal muscle spasms. Start at night and take one every night for the next several weeks to see if it improves your symptoms. Once you are improving you can taper off the medication and just use as needed. If the medication makes you drowsy then only use at bedtime and/or we can reduce the dose. Do not use gel to place it, as that will prevent the tablet from dissolving.  Instead place 1-2 drops of water on the table before inserting it in the vagina. If the pills do not dissolve  well we can switch to a special compounded suppository. Let me know how you are doing on the medication and if you have any questions.

## 2024-01-12 NOTE — Assessment & Plan Note (Signed)
-   Advised to limit caffeine and bladder irritants

## 2024-01-12 NOTE — Assessment & Plan Note (Signed)
-   The origin of pelvic floor muscle spasm can be multifactorial, including primary, reactive to a different pain source, trauma, or even part of a centralized pain syndrome.Treatment options include pelvic floor physical therapy, local (vaginal) or oral  muscle relaxants, pelvic muscle trigger point injections or centrally acting pain medications.   - She is unsure about pelvic PT. Not sure she is uncomfortable with any internal exams. She will consider it.  - Will prescribe vaginal valium  to use at night as needed for muscle tension

## 2024-01-12 NOTE — Assessment & Plan Note (Signed)
-   For treatment of stress urinary incontinence,  non-surgical options include expectant management, weight loss, physical therapy, as well as a pessary.  Surgical options include a midurethral sling, and transurethral injection of a bulking agent. - She is interested in a procedure but wants to read the materials and decide. She will let us  know how she wants to proceed. She did demonstrate leakage on exam today.

## 2024-01-13 ENCOUNTER — Encounter: Payer: Self-pay | Admitting: Obstetrics and Gynecology

## 2024-01-13 ENCOUNTER — Ambulatory Visit: Payer: Self-pay | Admitting: Obstetrics and Gynecology

## 2024-01-13 LAB — URINE CULTURE: Culture: <10000 — AB

## 2024-01-25 ENCOUNTER — Other Ambulatory Visit (HOSPITAL_BASED_OUTPATIENT_CLINIC_OR_DEPARTMENT_OTHER): Payer: Self-pay

## 2024-02-08 ENCOUNTER — Encounter: Payer: Self-pay | Admitting: *Deleted

## 2024-02-09 ENCOUNTER — Other Ambulatory Visit (HOSPITAL_BASED_OUTPATIENT_CLINIC_OR_DEPARTMENT_OTHER): Payer: Self-pay

## 2024-02-09 MED ORDER — NITROFURANTOIN MONOHYD MACRO 100 MG PO CAPS
100.0000 mg | ORAL_CAPSULE | Freq: Two times a day (BID) | ORAL | 0 refills | Status: AC
  Filled 2024-02-09: qty 14, 7d supply, fill #0

## 2024-02-09 NOTE — Progress Notes (Unsigned)
 "  Chief Complaint: GERD  HPI:    Stephanie Castro is a 59 year old female, known to Dr. Legrand, with a past medical history as listed below including GERD and hypertension, who was referred to me by Frann Mabel Mt, DO for a complaint of GERD.      12/10/2020 colonoscopy with 1 diminutive polyp in the distal transverse colon, diverticulosis in the left colon internal hemorrhoids.  Pathology showed hyperplastic polyp.  Repeat recommended in 10 years.    09/18/2021 echocardiogram with normal LVEF of 55-60%.    12/18/2023 patient seen by PCP for GERD.  At that time taking Prevacid 30 mg daily and Pepcid 20 mg twice daily.  When she was on that combo her symptoms were controlled but when she stopped she would have problems.  At that point changed Prevacid to Protonix  40 mg daily and referred to us .  Past Medical History:  Diagnosis Date   Arthritis    both knees , back and elbows   BMI 35.0-35.9,adult 05/15/2013   Breast cancer screening, high risk patient 04/04/2013   Pt states she had mmg 2015 nml results, ordered by Dr  Aletha, [facility] care. Results are not in Care everywhere. [facility] in [facility] , BI-RADS Category 1    Cardiac murmur 10/19/2019   Chronic pain of right knee 04/13/2020   Class 2 severe obesity due to excess calories with serious comorbidity and body mass index (BMI) of 35.0 to 35.9 in adult 09/14/2020   Concern about female breast disease without diagnosis 03/17/2018   DOE (dyspnea on exertion) 08/08/2015   Dysplastic nevus    beneath r eye   Edema of extremities 07/07/2013   Last Assessment & Plan:  Pt takes HCTZ daily.   Last Assessment & Plan:  Formatting of this note might be different from the original. Pt takes HCTZ daily.   Elevated BP without diagnosis of hypertension 06/15/2015   Elevated uric acid in blood 05/02/2020   Essential hypertension  02/26/2017   Exercise-induced asthma    Fatigue 07/06/2013   Last Assessment & Plan:  Will review previous records from previous PCP, pt had thyroid  done recently and was wnl. Pt had questions re lupus/MS and how to get tested for these.   Last Assessment & Plan:  Formatting of this note might be different from the original. Will review previous records from previous PCP, pt had thyroid  done recently and was wnl. Pt had questions re lupus/MS and how to get    GERD (gastroesophageal reflux disease)    Greater trochanteric bursitis of left hip 08/30/2020   Injection given August 30, 2020   Hypertension    Insomnia 05/27/2021   Lateral epicondylitis of both elbows 08/06/2018   Left hip pain 12/11/2020   Left knee pain 08/30/2020   Localized swelling of both lower extremities 06/05/2021   Low TSH level 04/22/2013   Mixed dyslipidemia 02/22/2020   Myalgia 07/06/2013   Last Assessment & Plan:  Formatting of this note might be different from the original. Will check labs today including sed rate, ANA/RF.  Last Assessment & Plan:  Formatting of this note might be different from the original. Pt would like to be tested for lupus in the future. Has been tested for RA and was negative   Obesity (BMI 35.0-39.9 without comorbidity) 11/30/2020   Other and unspecified hyperlipidemia 04/06/2011   Palpitations 10/19/2019   Perimenopausal 05/15/2013  Periodic edema 04/04/2013   Pt reports taking water pill for many years due to feeling swollen. Historically, took HCTZ but became hypokalemic. She was switched to triamterene -HCTZ with no more hypokalemic episodes.     Postoperative seroma of musculoskeletal structure after musculoskeletal procedure 07/10/2021   Left hip anteriorly     Preventative health care 04/04/2013   Primary localized osteoarthritis of right knee 10/13/2014   Snoring 05/27/2021   Squamous cell carcinoma, face 02/24/2014   Tear of medial meniscus of right knee 10/13/2014   Vitamin  B12 deficiency 05/15/2013   Last Assessment & Plan:  Formatting of this note might be different from the original. Recheck level today, may need to restart monthly Vit B12 shots   Vitamin D  deficiency 02/24/2014   Weight gain 04/24/2015    Past Surgical History:  Procedure Laterality Date   KNEE ARTHROSCOPY WITH MEDIAL MENISECTOMY Right 10/13/2014   Procedure: RIGHT KNEE ARTHROSCOPY WITH PARTIAL MEDIAL MENISECTOMY AND CHONDROPLASTY ;  Surgeon: Fonda Olmsted, MD;  Location: [facility];  Service: Orthopedics;  Laterality: Right;   TOTAL HIP ARTHROPLASTY Left 03/29/2021   VAGINAL HYSTERECTOMY  2006    Current Outpatient Medications  Medication Sig Dispense Refill   albuterol  (VENTOLIN  HFA) 108 (90 Base) MCG/ACT inhaler Inhale 1-2 puffs into the lungs every 6 (six) hours as needed (shortness of breath). 6.7 g 1   cetirizine  (ZYRTEC ) 10 MG tablet Take 1 tablet (10 mg total) by mouth daily. 100 tablet 2   conjugated estrogens  (PREMARIN ) vaginal cream Insert 0.5 applicatorsful every day by vaginal route at bedtime for 30 days. 30 g 6   cyanocobalamin  (VITAMIN B12) 1000 MCG/ML injection Inject 1 mL (1,000 mcg total) into the skin every 14 (fourteen) days. 10 mL 1   diazepam  (VALIUM ) 5 MG tablet Place 1 tablet vaginally nightly as needed for muscle spasm/ pelvic pain. 30 tablet 0   fluticasone  (FLONASE ) 50 MCG/ACT nasal spray Place 2 sprays into both nostrils daily. (Patient not taking: Reported on 01/12/2024) 16 g 6   furosemide  (LASIX ) 40 MG tablet Take 1 tablet (40 mg total) by mouth daily. (Patient not taking: Reported on 01/12/2024) 30 tablet 3   nitrofurantoin , macrocrystal-monohydrate, (MACROBID ) 100 MG capsule Take 1 capsule (100 mg total) by mouth every 12 (twelve) hours. 14 capsule 0   pantoprazole  (PROTONIX ) 40 MG tablet Take 1 tablet (40 mg total) by mouth daily. 30 tablet 1   potassium chloride  (KLOR-CON  M) 10 MEQ tablet Take 2 tablets (20 mEq total) by mouth with every  dose of lasix . (Patient not taking: Reported on 01/12/2024) 60 tablet 2   QUEtiapine  (SEROQUEL ) 25 MG tablet Take 1 tablet (25 mg total) by mouth at bedtime. (Patient not taking: Reported on 01/12/2024) 30 tablet 2   rosuvastatin  (CRESTOR ) 10 MG tablet Take 1 tablet (10 mg total) by mouth daily. (Patient not taking: Reported on 01/12/2024) 90 tablet 3   sodium chloride  (OCEAN) 0.65 % SOLN nasal spray Place 1 spray into both nostrils as needed for congestion. (Patient not taking: Reported on 01/12/2024) 44 mL 3   valsartan  (DIOVAN ) 80 MG tablet Take 1 tablet (80 mg total) by mouth daily. (Patient not taking: Reported on 01/12/2024) 90 tablet 0   Vitamin D , Ergocalciferol , (DRISDOL ) 1.25 MG (50000 UNIT) CAPS capsule Take 1 capsule (50,000 Units total) by mouth every 7 (seven) days. 12 capsule 0   No current facility-administered medications for this visit.    Allergies as of 02/11/2024 - Review Complete 01/12/2024  Allergen Reaction Noted   Penicillins  12/02/2010    Family History  Problem Relation Age of Onset   Colon polyps Mother    Hyperlipidemia Mother    Breast cancer Mother 69       breast   Heart attack Father    COPD Father    Colon polyps Sister    Heart attack Sister    Hyperlipidemia Sister    Hypertension Sister    Heart attack Sister    Hyperlipidemia Maternal Aunt    Breast cancer Maternal Aunt        breast   Hypertension Maternal Uncle    Diabetes Maternal Uncle    Colon cancer Maternal Uncle        colon   Hyperlipidemia Maternal Grandmother    Diabetes Maternal Grandmother    Breast cancer Maternal Grandmother        breast   Diabetes Maternal Grandfather    Sudden death Neg Hx    Esophageal cancer Neg Hx    Rectal cancer Neg Hx    Stomach cancer Neg Hx     Social History   Socioeconomic History   Marital status: Married    Spouse name: Caty Tessler   Number of children: 5   Years of education: Not on file   Highest education level: Some  college, no degree  Occupational History   Not on file  Tobacco Use   Smoking status: Former    Current packs/day: 0.00    Average packs/day: 1 pack/day for 20.0 years (20.0 ttl pk-yrs)    Types: Cigarettes    Start date: 55    Quit date: 2005    Years since quitting: 21.0    Passive exposure: Never   Smokeless tobacco: Never  Vaping Use   Vaping status: Never Used  Substance and Sexual Activity   Alcohol use: Yes    Alcohol/week: 2.0 standard drinks of alcohol    Types: 1 Glasses of wine, 1 Shots of liquor per week    Comment: occ   Drug use: Never   Sexual activity: Yes    Birth control/protection: Surgical  Other Topics Concern   Not on file  Social History Narrative   Married to [place].    Social Drivers of Health   Tobacco Use: Medium Risk (01/12/2024)   Patient History    Smoking Tobacco Use: Former    Smokeless Tobacco Use: Never    Passive Exposure: Never  Physicist, Medical Strain: Low Risk (02/12/2023)   Overall Financial Resource Strain (CARDIA)    Difficulty of Paying Living Expenses: Not hard at all  Food Insecurity: No Food Insecurity (02/12/2023)   Hunger Vital Sign    Worried About Running Out of Food in the Last Year: Never true    Ran Out of Food in the Last Year: Never true  Transportation Needs: No Transportation Needs (02/12/2023)   PRAPARE - Administrator, Civil Service (Medical): No    Lack of Transportation (Non-Medical): No  Physical Activity: Sufficiently Active (02/12/2023)   Exercise Vital Sign    Days of Exercise per Week: 4 days    Minutes of Exercise per Session: 50 min  Recent Concern: Physical Activity - Insufficiently Active (12/04/2022)   Exercise Vital Sign    Days of Exercise per Week: 3 days    Minutes of Exercise per Session: 30 min  Stress: No Stress Concern Present (02/12/2023)   [employer] of Occupational Health - Occupational Stress Questionnaire  Feeling of Stress : Only a little  Social Connections:  Socially Integrated (02/12/2023)   Social Connection and Isolation Panel    Frequency of Communication with Friends and Family: More than three times a week    Frequency of Social Gatherings with Friends and Family: Once a week    Attends Religious Services: 1 to 4 times per year    Active Member of [employer] or Organizations: No    Attends Engineer, Structural: More than 4 times per year    Marital Status: Married  Catering Manager Violence: Unknown (05/03/2021)   Received from [facility]   HITS    Physically Hurt: Not on file    Insult or Talk Down To: Not on file    Threaten Physical Harm: Not on file    Scream or Curse: Not on file  Depression (PHQ2-9): Low Risk (12/18/2023)   Depression (PHQ2-9)    PHQ-2 Score: 0  Alcohol Screen: Low Risk (02/12/2023)   Alcohol Screen    Last Alcohol Screening Score (AUDIT): 3  Housing: Unknown (02/12/2023)   Housing Stability Vital Sign    Unable to Pay for Housing in the Last Year: No    Number of Times Moved in the Last Year: Not on file    Homeless in the Last Year: No  Utilities: Not on file  Health Literacy: Not on file    Review of Systems:    Constitutional: No weight loss, fever, chills, weakness or fatigue HEENT: Eyes: No change in vision               Ears, Nose, Throat:  No change in hearing or congestion Skin: No rash or itching Cardiovascular: No chest pain, chest pressure or palpitations   Respiratory: No SOB or cough Gastrointestinal: See HPI and otherwise negative Genitourinary: No dysuria or change in urinary frequency Neurological: No headache, dizziness or syncope Musculoskeletal: No new muscle or joint pain Hematologic: No bleeding or bruising Psychiatric: No history of depression or anxiety    Physical Exam:  Vital signs: There were no vitals taken for this visit.  Constitutional:   Pleasant Caucasian female appears to be in NAD, Well developed, Well nourished, alert and cooperative Head:  Normocephalic  and atraumatic. Eyes:   PEERL, EOMI. No icterus. Conjunctiva pink. Ears:  Normal auditory acuity. Neck:  Supple Throat: Oral cavity and pharynx without inflammation, swelling or lesion.  Respiratory: Respirations even and unlabored. Lungs clear to auscultation bilaterally.   No wheezes, crackles, or rhonchi.  Cardiovascular: Normal S1, S2. No MRG. Regular rate and rhythm. No peripheral edema, cyanosis or pallor.  Gastrointestinal:  Soft, nondistended, nontender. No rebound or guarding. Normal bowel sounds. No appreciable masses or hepatomegaly. Rectal:  Not performed.  Msk:  Symmetrical without gross deformities. Without edema, no deformity or joint abnormality.  Neurologic:  Alert and  oriented x4;  grossly normal neurologically.  Skin:   Dry and intact without significant lesions or rashes. Psychiatric: Oriented to person, place and time. Demonstrates good judgement and reason without abnormal affect or behaviors.  RELEVANT LABS AND IMAGING: CBC    Component Value Date/Time   WBC 6.3 02/27/2023 1633   RBC 4.29 02/27/2023 1633   HGB 13.1 02/27/2023 1633   HCT 39.3 02/27/2023 1633   PLT 199 02/27/2023 1633   MCV 91.6 02/27/2023 1633   MCH 30.5 02/27/2023 1633   MCHC 33.3 02/27/2023 1633   RDW 12.2 02/27/2023 1633   LYMPHSABS 2.1 02/13/2023 0959   MONOABS 0.4  02/13/2023 0959   EOSABS 0.1 02/13/2023 0959   BASOSABS 0.0 02/13/2023 0959    CMP     Component Value Date/Time   NA 144 05/04/2023 0807   K 4.1 05/04/2023 0807   CL 106 05/04/2023 0807   CO2 20 05/04/2023 0807   GLUCOSE 78 05/04/2023 0807   GLUCOSE 87 02/27/2023 1633   BUN 9 05/04/2023 0807   CREATININE 0.83 05/04/2023 0807   CREATININE 0.76 02/27/2023 1633   CALCIUM  9.2 05/04/2023 0807   PROT 6.1 05/20/2023 0802   ALBUMIN 4.3 05/20/2023 0802   AST 28 05/20/2023 0802   ALT 19 05/20/2023 0802   ALKPHOS 82 05/20/2023 0802   BILITOT 0.4 05/20/2023 0802   GFRNONAA >60 09/21/2022 1637   GFRNONAA 85 02/03/2019  0916   GFRAA 99 03/07/2020 0803   GFRAA 98 02/03/2019 0916    Assessment: 1. ***  Plan: 1. ***     Delon Failing, PA-C [facility] 02/09/2024, 1:17 PM  Cc: Frann Mabel Mt, DO  "

## 2024-02-11 ENCOUNTER — Other Ambulatory Visit (HOSPITAL_BASED_OUTPATIENT_CLINIC_OR_DEPARTMENT_OTHER): Payer: Self-pay

## 2024-02-11 ENCOUNTER — Encounter: Payer: Self-pay | Admitting: Physician Assistant

## 2024-02-11 ENCOUNTER — Ambulatory Visit: Admitting: Physician Assistant

## 2024-02-11 VITALS — BP 142/88 | HR 58 | Ht 62.0 in | Wt 192.0 lb

## 2024-02-11 DIAGNOSIS — R1013 Epigastric pain: Secondary | ICD-10-CM

## 2024-02-11 DIAGNOSIS — K219 Gastro-esophageal reflux disease without esophagitis: Secondary | ICD-10-CM

## 2024-02-11 DIAGNOSIS — K5900 Constipation, unspecified: Secondary | ICD-10-CM

## 2024-02-11 MED ORDER — PANTOPRAZOLE SODIUM 40 MG PO TBEC
40.0000 mg | DELAYED_RELEASE_TABLET | Freq: Two times a day (BID) | ORAL | 5 refills | Status: AC
  Filled 2024-02-11: qty 60, 30d supply, fill #0

## 2024-02-11 NOTE — Patient Instructions (Signed)
 We have sent the following medications to your pharmacy for you to pick up at your convenience: Pantoprazole  40 mg twice daily 30-60 minutes before breakfast and dinner.   You have been scheduled for an endoscopy. Please follow written instructions given to you at your visit today.  If you use inhalers (even only as needed), please bring them with you on the day of your procedure.  If you take any of the following medications, they will need to be adjusted prior to your procedure:   DO NOT TAKE 7 DAYS PRIOR TO TEST- Trulicity (dulaglutide) Ozempic , Wegovy  (semaglutide ) Mounjaro, Zepbound (tirzepatide) Bydureon Bcise (exanatide extended release)  DO NOT TAKE 1 DAY PRIOR TO YOUR TEST Rybelsus  (semaglutide ) Adlyxin (lixisenatide) Victoza (liraglutide) Byetta (exanatide) ___________________________________________________________________________

## 2024-02-15 ENCOUNTER — Other Ambulatory Visit (HOSPITAL_BASED_OUTPATIENT_CLINIC_OR_DEPARTMENT_OTHER): Payer: Self-pay

## 2024-02-15 MED ORDER — SULFAMETHOXAZOLE-TRIMETHOPRIM 800-160 MG PO TABS
1.0000 | ORAL_TABLET | Freq: Two times a day (BID) | ORAL | 0 refills | Status: AC
  Filled 2024-02-15: qty 10, 5d supply, fill #0

## 2024-02-16 NOTE — Progress Notes (Signed)
 ____________________________________________________________  Attending physician addendum:  Thank you for sending this case to me. I have reviewed the entire note and agree with the plan.   Victory Brand, MD  ____________________________________________________________

## 2024-02-17 ENCOUNTER — Encounter: Payer: Self-pay | Admitting: Gastroenterology

## 2024-02-17 ENCOUNTER — Ambulatory Visit: Admitting: Gastroenterology

## 2024-02-17 VITALS — BP 123/81 | HR 65 | Temp 97.5°F | Resp 13 | Ht 62.0 in | Wt 192.0 lb

## 2024-02-17 DIAGNOSIS — R6881 Early satiety: Secondary | ICD-10-CM

## 2024-02-17 DIAGNOSIS — K219 Gastro-esophageal reflux disease without esophagitis: Secondary | ICD-10-CM

## 2024-02-17 DIAGNOSIS — R1013 Epigastric pain: Secondary | ICD-10-CM

## 2024-02-17 DIAGNOSIS — K2100 Gastro-esophageal reflux disease with esophagitis, without bleeding: Secondary | ICD-10-CM | POA: Diagnosis present

## 2024-02-17 MED ORDER — SODIUM CHLORIDE 0.9 % IV SOLN: 500.0000 mL | INTRAVENOUS | Status: DC

## 2024-02-17 NOTE — Progress Notes (Signed)
 Vss nad trans to pacu

## 2024-02-17 NOTE — Progress Notes (Signed)
 No significant changes to clinical history since GI office visit on 02/11/24.  The patient is appropriate for an endoscopic procedure in the ambulatory setting.  - Victory Brand, MD

## 2024-02-17 NOTE — Progress Notes (Signed)
 Called to room to assist during endoscopic procedure.  Patient ID and intended procedure confirmed with present staff. Received instructions for my participation in the procedure from the performing physician.

## 2024-02-17 NOTE — Progress Notes (Signed)
 Pt's states no medical or surgical changes since previsit or office visit.

## 2024-02-17 NOTE — Patient Instructions (Signed)
 Handout given on GERD.  Encouraged pt to follow lifestyle changes to help prevent GERD.  YOU HAD AN ENDOSCOPIC PROCEDURE TODAY AT THE [facility]:   Refer to the procedure report that was given to you for any specific questions about what was found during the examination.  If the procedure report does not answer your questions, please call your gastroenterologist to clarify.  If you requested that your care partner not be given the details of your procedure findings, then the procedure report has been included in a sealed envelope for you to review at your convenience later.  YOU SHOULD EXPECT: Some feelings of bloating in the abdomen. Passage of more gas than usual.  Walking can help get rid of the air that was put into your GI tract during the procedure and reduce the bloating. If you had a lower endoscopy (such as a colonoscopy or flexible sigmoidoscopy) you may notice spotting of blood in your stool or on the toilet paper. If you underwent a bowel prep for your procedure, you may not have a normal bowel movement for a few days.  Please Note:  You might notice some irritation and congestion in your nose or some drainage.  This is from the oxygen used during your procedure.  There is no need for concern and it should clear up in a day or so.  SYMPTOMS TO REPORT IMMEDIATELY:   Following upper endoscopy (EGD)  Vomiting of blood or coffee ground material  New chest pain or pain under the shoulder blades  Painful or persistently difficult swallowing  New shortness of breath  Fever of 100F or higher  Black, tarry-looking stools  For urgent or emergent issues, a gastroenterologist can be reached at any hour by calling [phone]. Do not use MyChart messaging for urgent concerns.    DIET:  We do recommend a small meal at first, but then you may proceed to your regular diet.  Drink plenty of fluids but you should avoid alcoholic beverages for 24 hours.  ACTIVITY:  You should plan  to take it easy for the rest of today and you should NOT DRIVE or use heavy machinery until tomorrow (because of the sedation medicines used during the test).    FOLLOW UP: Our staff will call the number listed on your records the next business day following your procedure.  We will call around 7:15- 8:00 am to check on you and address any questions or concerns that you may have regarding the information given to you following your procedure. If we do not reach you, we will leave a message.     If any biopsies were taken you will be contacted by phone or by letter within the next 1-3 weeks.  Please call us  at [phone] if you have not heard about the biopsies in 3 weeks.    SIGNATURES/CONFIDENTIALITY: You and/or your care partner have signed paperwork which will be entered into your electronic medical record.  These signatures attest to the fact that that the information above on your After Visit Summary has been reviewed and is understood.  Full responsibility of the confidentiality of this discharge information lies with you and/or your care-partner.

## 2024-02-17 NOTE — Op Note (Signed)
 [facility] Patient Name: Stephanie Castro Procedure Date: 02/17/2024 9:24 AM MRN: [id number] Endoscopist: Victory L. Legrand , MD, [id number] Age: 59 Referring MD:  Date of Birth: May 05, 1965 Gender: Female Account #: [id number] Procedure:                Upper GI endoscopy Indications:              Epigastric abdominal pain, esophageal reflux                            symptoms that persist despite appropriate therapy,                            Early satiety and bloating Medicines:                Monitored Anesthesia Care Procedure:                Pre-Anesthesia Assessment:                           - Prior to the procedure, a History and Physical                            was performed, and patient medications and                            allergies were reviewed. The patient's tolerance of                            previous anesthesia was also reviewed. The risks                            and benefits of the procedure and the sedation                            options and risks were discussed with the patient.                            All questions were answered, and informed consent                            was obtained. Prior Anticoagulants: The patient has                            taken no anticoagulant or antiplatelet agents. ASA                            Grade Assessment: II - A patient with mild systemic                            disease. After reviewing the risks and benefits,                            the patient was deemed in satisfactory condition to  undergo the procedure.                           After obtaining informed consent, the endoscope was                            passed under direct vision. Throughout the                            procedure, the patient's blood pressure, pulse, and                            oxygen saturations were monitored continuously. The                            GIF [id number] #[id number] was  introduced through the                            mouth, and advanced to the second part of duodenum.                            The upper GI endoscopy was accomplished without                            difficulty. The patient tolerated the procedure                            well. Scope In: Scope Out: Findings:                 LA Grade A (one or more mucosal breaks less than 5                            mm, not extending between tops of 2 mucosal folds)                            esophagitis was found at the gastroesophageal                            junction.                           The exam of the esophagus was otherwise normal.                           The stomach was normal. Biopsies were taken with a                            cold forceps for histology. (Antrum and body in                            same pathology jar to rule out H. pylori)                           The cardia and gastric  fundus were normal on                            retroflexion. (Hill grade 3)                           Normal mucosa was found in the entire duodenum.                            Biopsies for histology were taken with a cold                            forceps for evaluation of celiac disease. Complications:            No immediate complications. Estimated Blood Loss:     Estimated blood loss was minimal. Impression:               - LA Grade A reflux esophagitis.                           - Normal stomach. Biopsied.                           - Normal mucosa was found in the entire examined                            duodenum. Biopsied. Recommendation:           - Patient has a contact number available for                            emergencies. The signs and symptoms of potential                            delayed complications were discussed with the                            patient. Return to normal activities tomorrow.                            Written discharge instructions were  provided to the                            patient.                           - Resume previous diet.                           - Continue present medications.                           - Await pathology results.                           - Indefinite GERD-related diet and lifestyle  measures. These are at least as important as                            antacid medicine to help control symptoms. Fredrick Dray L. Legrand, MD 02/17/2024 10:01:56 AM This report has been signed electronically.

## 2024-02-18 ENCOUNTER — Telehealth: Payer: Self-pay | Admitting: *Deleted

## 2024-02-18 NOTE — Telephone Encounter (Signed)
 Post procedure follow up call placed, no answer and left VM.

## 2024-02-22 LAB — SURGICAL PATHOLOGY

## 2024-02-24 ENCOUNTER — Ambulatory Visit: Payer: Self-pay | Admitting: Gastroenterology

## 2024-02-24 NOTE — Progress Notes (Signed)
 Patient is scheduled on 03/17/24 @ 8:30am with Jessica Zehr, PA-C

## 2024-03-17 ENCOUNTER — Ambulatory Visit: Admitting: Gastroenterology

## 2024-04-12 ENCOUNTER — Ambulatory Visit: Admitting: Obstetrics and Gynecology

## 2024-04-12 ENCOUNTER — Ambulatory Visit: Admitting: Family Medicine
